# Patient Record
Sex: Male | Born: 1970 | Race: White | Hispanic: No | Marital: Married | Smoking: Never smoker
Health system: Southern US, Community
[De-identification: ages and names within clinical notes are randomized; demographics above are authoritative.]

## PROBLEM LIST (undated history)

## (undated) DIAGNOSIS — G4700 Insomnia, unspecified: Secondary | ICD-10-CM

## (undated) DIAGNOSIS — Z79890 Hormone replacement therapy (postmenopausal): Secondary | ICD-10-CM

## (undated) DIAGNOSIS — F411 Generalized anxiety disorder: Secondary | ICD-10-CM

## (undated) DIAGNOSIS — K5792 Diverticulitis of intestine, part unspecified, without perforation or abscess without bleeding: Secondary | ICD-10-CM

## (undated) DIAGNOSIS — F988 Other specified behavioral and emotional disorders with onset usually occurring in childhood and adolescence: Secondary | ICD-10-CM

## (undated) DIAGNOSIS — M1990 Unspecified osteoarthritis, unspecified site: Secondary | ICD-10-CM

## (undated) DIAGNOSIS — G4730 Sleep apnea, unspecified: Secondary | ICD-10-CM

## (undated) DIAGNOSIS — T7840XA Allergy, unspecified, initial encounter: Secondary | ICD-10-CM

## (undated) HISTORY — DX: Other specified behavioral and emotional disorders with onset usually occurring in childhood and adolescence: F98.8

## (undated) HISTORY — DX: Diverticulitis of intestine, part unspecified, without perforation or abscess without bleeding: K57.92

## (undated) HISTORY — DX: Allergy, unspecified, initial encounter: T78.40XA

## (undated) HISTORY — DX: Hormone replacement therapy: Z79.890

## (undated) HISTORY — PX: HERNIA REPAIR: SHX51

## (undated) HISTORY — DX: Insomnia, unspecified: G47.00

## (undated) HISTORY — DX: Generalized anxiety disorder: F41.1

---

## 2007-01-12 ENCOUNTER — Emergency Department (HOSPITAL_COMMUNITY): Admission: EM | Admit: 2007-01-12 | Discharge: 2007-01-13 | Payer: Self-pay | Admitting: Emergency Medicine

## 2008-08-12 HISTORY — PX: QUADRICEPS TENDON REPAIR: SHX756

## 2008-08-19 ENCOUNTER — Ambulatory Visit (HOSPITAL_BASED_OUTPATIENT_CLINIC_OR_DEPARTMENT_OTHER): Admission: RE | Admit: 2008-08-19 | Discharge: 2008-08-19 | Payer: Self-pay | Admitting: Orthopaedic Surgery

## 2008-08-28 ENCOUNTER — Encounter: Admission: RE | Admit: 2008-08-28 | Discharge: 2008-08-28 | Payer: Self-pay | Admitting: Orthopaedic Surgery

## 2010-02-17 ENCOUNTER — Encounter: Payer: Self-pay | Admitting: Internal Medicine

## 2010-03-19 ENCOUNTER — Encounter: Payer: Self-pay | Admitting: Internal Medicine

## 2010-04-08 ENCOUNTER — Ambulatory Visit
Admission: RE | Admit: 2010-04-08 | Discharge: 2010-04-08 | Payer: Self-pay | Source: Home / Self Care | Attending: Internal Medicine | Admitting: Internal Medicine

## 2010-04-08 ENCOUNTER — Other Ambulatory Visit: Payer: Self-pay | Admitting: Internal Medicine

## 2010-04-08 DIAGNOSIS — R5383 Other fatigue: Secondary | ICD-10-CM

## 2010-04-08 DIAGNOSIS — R0602 Shortness of breath: Secondary | ICD-10-CM | POA: Insufficient documentation

## 2010-04-08 DIAGNOSIS — R5381 Other malaise: Secondary | ICD-10-CM | POA: Insufficient documentation

## 2010-04-14 ENCOUNTER — Encounter: Payer: Self-pay | Admitting: Internal Medicine

## 2010-04-15 NOTE — Assessment & Plan Note (Signed)
Summary: increasing scarring on cxr/jd   Visit Type:  Initial Consult Copy to:  Dr. Earl Lites Primary Provider/Referring Provider:  Dr. Eula Listen  CC:  Pulmonary Consult for abnormal CXR. Pt was diagnosed with PNA in 04-2009 and feel she has has on and off congestion and cough since then. .  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 AT work he is exposed to lot of wood dust. States was in usual state of health till feb 2011. One day he installed some concrete while installing deck. Immediately felt inhalation of cement. Soon after developed chest tightness, maybe some wheeze,raspy voice. This lasted several weeks. AFter that had global sense of fatigue worse with exertion. THis has persisted since then. Subsequently in Nov 2011 developed another "Episode" that was treated with abx. This time he thinks was a viral episode or possibly related to work. Needed antibiotics. Reportedly CXR at this time showed Rt sided pneumonia (I do not have this cxr). Then again similear episode around 02/17/2010 and again treated with abx. This time girlfriend also got sick. CXR done this time looks clear to me. HE is still left with fatigue, rasy voice, chest tightness and some dyspnea.   Preventive Screening-Counseling & Management  Alcohol-Tobacco     Smoking Status: never  Current Medications (verified): 1)  Concerta 27 Mg Cr-Tabs (Methylphenidate Hcl) .... Take 1 Tablet By Mouth Once A Day 2)  Klonopin 0.5 Mg Tabs (Clonazepam) .... 2 Tabs Daily 3)  Nasal Spray 0.05 % Soln (Oxymetazoline Hcl) .... As Needed  Allergies (verified): 1)  ! 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  The patient denies shortness of breath with activity, shortness of breath at rest, productive cough, non-productive cough, coughing up blood, chest pain, irregular heartbeats, acid heartburn, indigestion, difficulty swallowing, sore throat, tooth/dental problems, headaches, nasal congestion/difficulty breathing through nose, sneezing, itching, ear ache, depression, hand/feet swelling, rash, change in color of mucus, and fever.    Vital Signs:  Patient profile:   40 year old male Height:      70 inches Weight:      233.50 pounds BMI:     33.62 O2 Sat:      96 % on Room air Temp:     98.0 degrees F oral Pulse rate:   89 / minute BP sitting:   128 / 82  (right arm) Cuff size:   regular  Vitals Entered By: Carron Curie CMA (April 08, 2010 3:34 PM)  O2 Flow:  Room air CC: Pulmonary Consult for abnormal CXR. Pt was diagnosed with PNA in 04-2009 and feel she has has on and off congestion and cough since then.  Comments Medications reviewed with patient Carron Curie CMA  April 08, 2010 3:39 PM Daytime phone number verified with patient.    Physical Exam  General:  mucled male heavy muscle bulk Head:  normocephalic and atraumatic Eyes:  PERRLA/EOM intact; conjunctiva and sclera clear Ears:  TMs intact and clear with normal canals Nose:  no deformity, discharge, inflammation, or lesions Mouth:  no deformity or lesions Neck:  no masses, thyromegaly, or abnormal cervical nodes Chest Wall:  no deformities noted Lungs:  clear bilaterally to auscultation and percussion Heart:  regular rate and rhythm, S1, S2 without murmurs, rubs, gallops, or clicks Abdomen:  bowel sounds positive; abdomen soft and non-tender without masses, or organomegaly Msk:  no deformity or scoliosis noted with normal  posture Pulses:  pulses normal Extremities:  no clubbing, cyanosis, edema, or deformity noted Neurologic:  CN II-XII grossly intact with normal reflexes, coordination, muscle strength and tone Skin:  intact without lesions or rashes Cervical Nodes:  no significant adenopathy Axillary Nodes:  no significant adenopathy Psych:  alert and cooperative; normal mood and affect; normal attention span and concentration   CXR  Procedure date:  02/17/2010  Findings:      Looks clear to me  Impression & Recommendations:  Problem # 1:  SHORTNESS OF BREATH (SOB) (ICD-786.05) Assessment New  acute bronchitiic episodes sound like asthma. IN between he has fatigue that is exertional and some dyspnea. Wonder if he has atshma from [employer] exposure. Also, CT abd 2005 suggests bibasal atelectasis/scarring. I will get PFts and decide next step of CT verus methacholine challenge versus CPST  Orders: Consultation Level V (16109)  Problem # 2:  FATIGUE (ICD-780.79) Assessment: New  checkTSH  Orders: Consultation Level V (60454)  Medications Added to Medication List This Visit: 1)  Concerta 27 Mg Cr-tabs (Methylphenidate hcl) .... Take 1 tablet by mouth once a day 2)  Klonopin 0.5 Mg Tabs (Clonazepam) .... 2 tabs daily 3)  Nasal Spray 0.05 % Soln (Oxymetazoline hcl) .... As needed  Patient Instructions: 1)  get thyroid function studies 2)  get full pft 3)  woill call you with result

## 2010-04-21 NOTE — Letter (Signed)
Summary: [facility]   Imported By: Sherian Rein 04/14/2010 10:15:44  _____________________________________________________________________  External Attachment:    Type:   Image     Comment:   External Document

## 2010-07-27 NOTE — Op Note (Signed)
NAME:  Jose Shaffer, Jose Shaffer          ACCOUNT NO.:  [id number]   MEDICAL RECORD NO.:  [id number]          PATIENT TYPE:  AMB   LOCATION:  DSC                          FACILITY:  [facility]   PHYSICIAN:  Lubertha Basque. Dalldorf, M.D.DATE OF BIRTH:  03/31/1970   DATE OF PROCEDURE:  08/19/2008  DATE OF DISCHARGE:                               OPERATIVE REPORT   PREOPERATIVE DIAGNOSIS:  Left quadriceps rupture.   POSTOPERATIVE DIAGNOSIS:  Left quadriceps rupture.   PROCEDURE:  Left quadriceps repair.   ANESTHESIA:  General and block.   ATTENDING SURGEON:  Lubertha Basque. Jerl Santos, MD   ASSISTANT:  Lindwood Qua, PA   INDICATIONS FOR PROCEDURE:  The patient is a 40 year old man who  suffered a injury stepping off a deck a few days back.  He had a  palpable defect above his kneecap and an inability to raise his leg.  He  is presumed to have a quadriceps rupture and this has been confirmed by  ultrasound.  He is offered quadriceps repair.  Informed operative  consent was obtained after discussion of possible complications  including reaction to anesthesia, infection, and repeat rupture.   SUMMARY/FINDINGS/PROCEDURE:  Under general anesthesia and a block  through a longitudinal incision, a quadriceps rupture was exposed and  repaired.  This involved the entire central portion of the quadriceps  and this was repaired with 4 core limbs of suture to soft tissue as the  rupture was a couple of centimeters above the attachment on the patella.  There was also a lateral aspect that was completely ruptured and this  was repaired.  Bryna Colander assisted throughout and was invaluable to  the completion of the case and that he helped maintain reduction while I  performed the repair.  He also closed simultaneously to help minimize  the OR time.   DESCRIPTION OF PROCEDURE:  The patient was taken to the operating suite  where general anesthetic was applied without difficulty.  He was also  given a block in the  preanesthesia area.  He was positioned supine and  prepped and draped in normal sterile fashion.  After administration of  IV Kefzol, the left leg was elevated, exsanguinated, and a tourniquet  inflated about the thigh.  A longitudinal anterior incision was made  with dissection down to the quadriceps.  The fascial layer was split  longitudinally and this exposed a complete rupture of the central  insertion of the quadriceps mechanism.  This was actually ruptured 2 cm  above the superior pole of the patella.  We elected to do a soft tissue  repair.  I placed two of the #2 FiberWire sutures in [place] fashion  proximally and passed all 4 limbs of the two sutures through to the soft  tissue above the patella.  This brought the central portion down  appropriately.  I then oversewed with #1 Vicryl.  There was a lateral  rent in the quadriceps as well which was repaired with #2 FiberWire in a  figure-of-eight fashion.  The tourniquet was deflated and a small amount  of bleeding was easily controlled with Bovie cautery and some pressure.  We did evacuate a hemarthrosis in the middle of the case.  We also  irrigated the knee at that point.  The fascial layer was then  reapproximated with #1 Vicryl longitudinally followed by reapproximation  of subcu with 2-0 undyed Vicryl and skin with staples.  Adaptic was  applied followed by dry gauze and loose Ace wrap and knee immobilizer.  Estimated blood loss and intraoperative fluids as well as accurate  tourniquet time can be obtained from the Anesthesia records.   DISPOSITION:  The patient was extubated in the operating room and taken  to recovery in stable addition.  He is to go home same-day and follow up  in the office closely.  I will contact him by phone tonight.      Lubertha Basque Jerl Santos, M.D.  Electronically Signed     PGD/MEDQ  D:  08/19/2008  T:  08/20/2008  Job:  [id number]

## 2010-07-27 NOTE — H&P (Signed)
NAME:  Jose Shaffer, Jose Shaffer          ACCOUNT NO.:  [id number]   MEDICAL RECORD NO.:  [id number]          PATIENT TYPE:  EMS   LOCATION:  MAJO                         FACILITY:  [facility]   PHYSICIAN:  Ollen Gross. Vernell Morgans, M.D. DATE OF BIRTH:  [birth year]   DATE OF ADMISSION:  01/12/2007  DATE OF DISCHARGE:                              HISTORY & PHYSICAL   Mr. Krider is a 40 year old white male who presents to the emergency  department tonight with upper abdominal pain and nausea that started  earlier today.  He has not vomited.  He did pass flatus earlier and had  a normal bowel movement today.  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  He developed it 6 years ago raking  leaves in the yard.  Since he  noticed it has always been little bit  stuck and never really reduced.  It has not changed to the recent past.  He otherwise denies any chest pain, shortness of breath, diarrhea,  dysuria.  The rest of his review of systems unremarkable.   PAST MEDICAL HISTORY:  Significant for attention deficit disorder.   PAST SURGICAL HISTORY:  None.   MEDICATIONS:  Clonazepam.   ALLERGIES:  Are to aspirin.   SOCIAL HISTORY:  Denies any use of alcohol or tobacco products.   FAMILY HISTORY:  Is noncontributory.   PHYSICAL EXAM:  Temperature is 97.6, blood pressure 145/90, pulse 97.  GENERAL:  He is a well-nourished white male in no acute distress.  Skin  is warm and dry without jaundice.  EYES:  Extraocular movements intact.  Pupils equal, round and reactive  to light.  Sclerae nonicteric.  LUNGS:  Clear bilaterally with no use of accessory muscles.  HEART is regular rate and rhythm without impulse in left chest.  ABDOMEN soft and nontender, nondistended.  He does have a small  umbilical hernia that does not really reduce.  It is a little bit  painful to manipulate but as he says it is unchanged. EXTREMITIES:  No  cyanosis, clubbing or edema, with good strength in his  arms and legs.  PSYCHOLOGICALLY he is alert and oriented x3 with no evidence of anxiety  or depression.   LAB WORK:  Was reviewed and was significant for a elevated white count  of 13.4.  His plain abdominal x-rays were consistent with a small bowel  obstruction.   ASSESSMENT/PLAN:  This is a 40 year old white male with some abdominal  pain and a picture of a small bowel obstruction on plain film.  This  certainly could be coming from his small umbilical hernia although  gastritis would also be in the differential given that his hernia is not  changed.  Will plan to get a CT scan to evaluate this hernia.  If the  hernia does contained a loop or knuckle of small intestine that is  causing the obstruction, then I think he will  probably need urgent surgery tonight to fix this.  I have explained to  him in detail the risks and benefits of this type of operation as well  as some of the technical aspects.  He understands and agrees with this  treatment plan. We will wait for the scan results.      Ollen Gross. Vernell Morgans, M.D.  Electronically Signed     PST/MEDQ  D:  01/13/2007  T:  01/14/2007  Job:  [id number]

## 2010-12-22 LAB — URINE MICROSCOPIC-ADD ON

## 2010-12-22 LAB — CBC
HCT: 50.2
Hemoglobin: 17.1 — ABNORMAL HIGH
MCHC: 34
MCV: 84.8
Platelets: 334
RBC: 5.92 — ABNORMAL HIGH
RDW: 13.2
WBC: 13.4 — ABNORMAL HIGH

## 2010-12-22 LAB — URINALYSIS, ROUTINE W REFLEX MICROSCOPIC
Bilirubin Urine: NEGATIVE
Glucose, UA: NEGATIVE
Hgb urine dipstick: NEGATIVE
Ketones, ur: NEGATIVE
Leukocytes, UA: NEGATIVE
Nitrite: NEGATIVE
Protein, ur: 30 — AB
Specific Gravity, Urine: 1.024
Urobilinogen, UA: 0.2
pH: 8.5 — ABNORMAL HIGH

## 2010-12-22 LAB — DIFFERENTIAL
Basophils Absolute: 0
Basophils Relative: 0
Eosinophils Absolute: 0
Eosinophils Relative: 0
Lymphocytes Relative: 8 — ABNORMAL LOW
Lymphs Abs: 1
Monocytes Absolute: 1.2 — ABNORMAL HIGH
Monocytes Relative: 9
Neutro Abs: 11.1 — ABNORMAL HIGH
Neutrophils Relative %: 83 — ABNORMAL HIGH

## 2010-12-22 LAB — COMPREHENSIVE METABOLIC PANEL
AST: 73 — ABNORMAL HIGH
BUN: 11
CO2: 25
Chloride: 97
Creatinine, Ser: 1.15
Total Protein: 7.2

## 2010-12-22 LAB — COMPREHENSIVE METABOLIC PANEL WITH GFR
ALT: 48
Albumin: 4.5
Alkaline Phosphatase: 46
Calcium: 9.8
GFR calc Af Amer: >60
GFR calc non Af Amer: >60
Glucose, Bld: 97
Potassium: 5.1
Sodium: 132 — ABNORMAL LOW
Total Bilirubin: 2.1 — ABNORMAL HIGH

## 2010-12-22 LAB — LIPASE, BLOOD: Lipase: 23

## 2011-04-11 ENCOUNTER — Ambulatory Visit (INDEPENDENT_AMBULATORY_CARE_PROVIDER_SITE_OTHER): Payer: BC Managed Care – PPO

## 2011-04-11 DIAGNOSIS — F411 Generalized anxiety disorder: Secondary | ICD-10-CM

## 2011-04-11 DIAGNOSIS — R030 Elevated blood-pressure reading, without diagnosis of hypertension: Secondary | ICD-10-CM

## 2011-09-11 ENCOUNTER — Ambulatory Visit (INDEPENDENT_AMBULATORY_CARE_PROVIDER_SITE_OTHER): Payer: BC Managed Care – PPO | Admitting: Internal Medicine

## 2011-09-11 VITALS — BP 144/93 | HR 97 | Temp 98.9°F | Resp 16 | Ht 71.25 in | Wt 238.0 lb

## 2011-09-11 DIAGNOSIS — G4730 Sleep apnea, unspecified: Secondary | ICD-10-CM

## 2011-09-11 DIAGNOSIS — F988 Other specified behavioral and emotional disorders with onset usually occurring in childhood and adolescence: Secondary | ICD-10-CM

## 2011-09-11 DIAGNOSIS — R0981 Nasal congestion: Secondary | ICD-10-CM

## 2011-09-11 DIAGNOSIS — R030 Elevated blood-pressure reading, without diagnosis of hypertension: Secondary | ICD-10-CM

## 2011-09-11 DIAGNOSIS — R400 Somnolence: Secondary | ICD-10-CM

## 2011-09-11 DIAGNOSIS — G4710 Hypersomnia, unspecified: Secondary | ICD-10-CM

## 2011-09-11 DIAGNOSIS — J3489 Other specified disorders of nose and nasal sinuses: Secondary | ICD-10-CM

## 2011-09-11 MED ORDER — METHYLPHENIDATE HCL ER (OSM) 36 MG PO TBCR: 36.0000 mg | EXTENDED_RELEASE_TABLET | Freq: Two times a day (BID) | ORAL | Status: DC

## 2011-09-11 MED ORDER — CLONAZEPAM 0.5 MG PO TABS: 0.5000 mg | ORAL_TABLET | Freq: Two times a day (BID) | ORAL | Status: DC | PRN

## 2011-09-11 NOTE — Progress Notes (Signed)
  Subjective:    Patient ID: Jose Shaffer, male    DOB: [birth year], 41 y.o.   MRN: [id number]  HPIHere for followup for ADD medication History dates to childhood was never treated  Dr. Ferd Glassing started treatment as an adult Uses Concerta/has irritability would wear off controlled by Klonopin/ Does well at work with this regimen Note increased blood pressure    Review of SystemsHis long history of nasal congestion dating to childhood Nonrestorative sleep Daytime hypersomnolence Girlfriend has observed apnea with waking and gasping Often has early morning trouble with getting started which Concerta helps No history of heart problems No exercise intolerance C. Pulmonary evaluation for shortness of breath inconclusive     Objective:   Physical Exam  Vital signs blood pressure 144/93 Nasal turbinates are very boggy/noisy breathing Neck without thyromegaly or lymphadenopathy Heart regular Extremities no edema Neurological intact Mood and affect normal      Assessment & Plan:   1. ADD (attention deficit disorder)    2. Sleep apnea  Nocturnal polysomnography (NPSG)  3. Daytime somnolence    4. Chronic nasal congestion    5. Elevated blood pressure     Meds ordered this encounter  Medications  . DISCONTD: methylphenidate (CONCERTA) 36 MG CR tablet    Sig: Take 36 mg by mouth 2 (two) times daily.   . methylphenidate (CONCERTA) 36 MG CR tablet    Sig: Take 1 tablet (36 mg total) by mouth 2 (two) times daily.    Dispense:  60 tablet    Refill:  0  . methylphenidate (CONCERTA) 36 MG CR tablet    Sig: Take 1 tablet (36 mg total) by mouth 2 (two) times daily.    Dispense:  60 tablet    Refill:  0  . methylphenidate (CONCERTA) 36 MG CR tablet    Sig: Take 1 tablet (36 mg total) by mouth 2 (two) times daily.    Dispense:  60 tablet    Refill:  0  . clonazePAM (KLONOPIN) 0.5 MG tablet    Sig: Take 1 tablet (0.5 mg total) by mouth 2 (two) times daily as needed for  anxiety.    Dispense:  60 tablet    Refill:  5   Followup after sleep study Followup in 3 months for concerta

## 2011-10-04 ENCOUNTER — Other Ambulatory Visit: Payer: Self-pay | Admitting: Family Medicine

## 2011-10-07 ENCOUNTER — Encounter (HOSPITAL_BASED_OUTPATIENT_CLINIC_OR_DEPARTMENT_OTHER): Payer: Self-pay

## 2011-10-09 ENCOUNTER — Encounter (HOSPITAL_BASED_OUTPATIENT_CLINIC_OR_DEPARTMENT_OTHER): Payer: Self-pay

## 2011-10-19 ENCOUNTER — Ambulatory Visit (INDEPENDENT_AMBULATORY_CARE_PROVIDER_SITE_OTHER): Payer: BC Managed Care – PPO | Admitting: Internal Medicine

## 2011-10-19 VITALS — BP 143/83 | HR 85 | Temp 98.2°F | Resp 16 | Ht 71.25 in | Wt 244.0 lb

## 2011-10-19 DIAGNOSIS — F411 Generalized anxiety disorder: Secondary | ICD-10-CM

## 2011-10-19 DIAGNOSIS — Z0489 Encounter for examination and observation for other specified reasons: Secondary | ICD-10-CM

## 2011-10-19 DIAGNOSIS — F419 Anxiety disorder, unspecified: Secondary | ICD-10-CM

## 2011-10-19 NOTE — Patient Instructions (Signed)
Decrease the klonopin by .25 mg every 2 weeks. No salty foods no added salt. followup as directed.

## 2011-10-19 NOTE — Progress Notes (Signed)
  Subjective:    Patient ID: Jose Shaffer, male    DOB: 1970-08-15, 41 y.o.   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 Feels a little anxious. Stopped concerta several days ago and decreased klonopin from 1 mg at bedtime to .5 mg at bedtime 2 days ago.also concerned about his bp being slightly elevated. He eats poorly chips and lasagna today.Also request urine drug test  Review of Systems  Constitutional: Negative.   HENT: Negative.   Eyes: Negative.   Cardiovascular: Negative.   Gastrointestinal: Negative.   Genitourinary: Negative.   Musculoskeletal: Negative.   Skin: Negative.   Neurological: Negative.   Hematological: Negative.   Psychiatric/Behavioral: Positive for agitation. Negative for hallucinations, behavioral problems, confusion, dysphoric mood and decreased concentration.  All other systems reviewed and are negative.       Objective:   Physical Exam  Nursing note and vitals reviewed. Constitutional: He is oriented to person, place, and time. He appears well-developed and well-nourished.  HENT:  Head: Normocephalic and atraumatic.  Right Ear: External ear normal.  Left Ear: External ear normal.  Eyes: Conjunctivae and EOM are normal. Pupils are equal, round, and reactive to light.  Neck: Normal range of motion. Neck supple.  Cardiovascular: Normal rate, regular rhythm and normal heart sounds.   Pulmonary/Chest: Effort normal and breath sounds normal.  Abdominal: Soft. Bowel sounds are normal.  Musculoskeletal: Normal range of motion.  Neurological: He is alert and oriented to person, place, and time.  Skin: Skin is warm and dry.  Psychiatric: He has a normal mood and affect. His behavior is normal. Judgment and thought content normal.          Assessment & Plan:  Anxiety   plan to decrease the benzodiazepam more slowly at .25 mg every 2 weeks.  Will re eval bp after making dietary changes and after withdrawal symptoms have resolves.

## 2011-10-26 ENCOUNTER — Telehealth: Payer: Self-pay | Admitting: Family Medicine

## 2011-10-26 ENCOUNTER — Ambulatory Visit (INDEPENDENT_AMBULATORY_CARE_PROVIDER_SITE_OTHER): Payer: BC Managed Care – PPO | Admitting: Family Medicine

## 2011-10-26 DIAGNOSIS — Z0283 Encounter for blood-alcohol and blood-drug test: Secondary | ICD-10-CM

## 2011-10-26 DIAGNOSIS — Z0489 Encounter for examination and observation for other specified reasons: Secondary | ICD-10-CM

## 2011-10-26 NOTE — Telephone Encounter (Signed)
Called Pt- He stated he used the restroom at the front (with the passbox) to give his urine sample. I checked with Revonda Standard, Dr. Lennette Bihari assistant from last week and she stated she did not do a drug screen for this Pt.  Pt agrees to come in this morning for a repeat D/S - on the house. Will notify Enrique Sack. and front desk staff. Eileen Stanford

## 2012-02-05 ENCOUNTER — Ambulatory Visit: Payer: Self-pay | Admitting: Physician Assistant

## 2012-02-05 VITALS — BP 154/94 | HR 100 | Temp 98.2°F | Resp 16 | Ht 71.25 in | Wt 236.4 lb

## 2012-02-05 DIAGNOSIS — J019 Acute sinusitis, unspecified: Secondary | ICD-10-CM

## 2012-02-05 DIAGNOSIS — J329 Chronic sinusitis, unspecified: Secondary | ICD-10-CM

## 2012-02-05 MED ORDER — GUAIFENESIN ER 1200 MG PO TB12: 1.0000 | ORAL_TABLET | Freq: Two times a day (BID) | ORAL | Status: DC | PRN

## 2012-02-05 MED ORDER — AMOXICILLIN-POT CLAVULANATE 875-125 MG PO TABS: 1.0000 | ORAL_TABLET | Freq: Two times a day (BID) | ORAL | Status: DC

## 2012-02-05 MED ORDER — IPRATROPIUM BROMIDE 0.03 % NA SOLN: 2.0000 | Freq: Two times a day (BID) | NASAL | Status: DC

## 2012-02-05 NOTE — Patient Instructions (Signed)
Get plenty of rest and drink at least 64 ounces of water daily. 

## 2012-02-05 NOTE — Progress Notes (Signed)
  Subjective:    Patient ID: Jose Shaffer, male    DOB: 03-07-1971, 41 y.o.   MRN: [id number]  HPI This 41 y.o. male presents for evaluation of "a sinus infection."  Congested "all over," mostly on the left side.  Facial pain, HA and upper teeth hurt. No "green stuff" but has bloody nasal discharge.  Began with a sore throat and has progressed over the past 6 days.  Feels feverish/chilled.  Nausea, vomiting x 1, no diarrhea.  No cough.     Past Medical History  Diagnosis Date  . Allergy   . ADD (attention deficit disorder)   . Insomnia     Past Surgical History  Procedure Date  . Quadriceps tendon repair 08/2008    Prior to Admission medications   Medication Sig Start Date End Date Taking? Authorizing Provider  clonazePAM (KLONOPIN) 0.5 MG tablet Take 1 mg by mouth daily. 09/11/11 03/06/12 Yes Tonye Pearson, MD    Allergies  Allergen Reactions  . Ibuprofen     History   Social History  . Marital Status: Single    Spouse Name: n/a    Number of Children: 0  . Years of Education: 12+   Occupational History  . truck driver   . contractor    Social History Main Topics  . Smoking status: Never Smoker   . Smokeless tobacco: Never Used  . Alcohol Use: No  . Drug Use: No  . Sexually Active: Yes -- Male partner(s)    Birth Control/ Protection: None   Other Topics Concern  . Not on file   Social History Narrative   Lives with girlfriend.    Family History  Problem Relation Age of Onset  . Hypertension Father   . Cancer Maternal Grandmother 41    breast cancer  . Hypertension Paternal Grandmother   . COPD Paternal Grandfather     lung cancer; tobacco use, Doctor, general practice  . Cancer Maternal Aunt     breast cancer, late 67's  . Cancer Maternal Aunt     breast cancer; diagnosed late 50's    Review of Systems As above.    Objective:   Physical Exam  Blood pressure 154/94, pulse 100, temperature 98.2 F (36.8 C), temperature source Oral, resp.  rate 16, height 5' 11.25" (1.81 m), weight 236 lb 6.4 oz (107.23 kg), SpO2 100.00%. Body mass index is 32.74 kg/(m^2). Well-developed, well nourished WM who is awake, alert and oriented, in NAD. HEENT: [place]/AT, PERRL, EOMI.  Sclera and conjunctiva are clear.  EAC are patent, TMs are normal in appearance. Nasal mucosa is pink and moist. OP is clear. No frontal sinus tenderness.  LEFT maxillary sinus tenderness. Neck: supple, non-tender, no lymphadenopathy, thyromegaly. Heart: RRR, no murmur Lungs: normal effort, CTA Extremities: no cyanosis, clubbing or edema. Skin: warm and dry. Psychologic: good mood and appropriate affect, normal speech and behavior.     Assessment & Plan:   1. Sinusitis  ipratropium (ATROVENT) 0.03 % nasal spray, Guaifenesin (MUCINEX MAXIMUM STRENGTH) 1200 MG TB12, amoxicillin-clavulanate (AUGMENTIN) 875-125 MG per tablet   Supportive care.  Anticipatory guidance.

## 2012-02-27 ENCOUNTER — Telehealth: Payer: Self-pay | Admitting: Radiology

## 2012-02-27 NOTE — Telephone Encounter (Signed)
I have gotten a form, from [employer] where patient works, they need you to provide a note that you are aware of drivers assigned duty and his Klopinin will not adversely affect his ability to safely operate a vehicle. Please advise if you can write this note.

## 2012-02-28 NOTE — Telephone Encounter (Signed)
I cannot provide a note that says he will be completely safe driving under the influence of Klonopin. I can only provide a note saying that he is advised to use his Klonopin after work hours and before bed only

## 2012-02-29 ENCOUNTER — Telehealth: Payer: Self-pay

## 2012-02-29 NOTE — Telephone Encounter (Signed)
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 PT HAS WEANED OFF KLONOPIN. WANTS TO COME IN TO TAKE D/S TO SHOW HE IS CLEAR OF PRESCRIPTION. BC HE MENTIONED ON HIS DOT FORM. HE NEEDS A LETTER STATING THAT HE IS NO LONGER TAKING THE MEDICATION.     CALL BACK # [phone]

## 2012-02-29 NOTE — Telephone Encounter (Signed)
Called patient to advise. Letter is faxed for him

## 2012-03-01 ENCOUNTER — Ambulatory Visit: Payer: Self-pay | Admitting: Family Medicine

## 2012-03-01 VITALS — BP 120/82 | HR 90 | Temp 98.4°F | Resp 16 | Ht 71.0 in | Wt 240.4 lb

## 2012-03-01 DIAGNOSIS — Z719 Counseling, unspecified: Secondary | ICD-10-CM

## 2012-03-01 NOTE — Telephone Encounter (Signed)
Does he need to return to office to see you? Or can he just come in for drug screen?

## 2012-03-01 NOTE — Patient Instructions (Signed)
See the printed letter.  If more information needed let us know, or any recurrence in Add symptoms - recheck in office. Return to the clinic or go to the nearest emergency room if any of your symptoms worsen or new symptoms occur.

## 2012-03-01 NOTE — Telephone Encounter (Signed)
I saw patient in office this am to discuss this and letter given then (phone note from yesterday noted at today's OV).

## 2012-03-01 NOTE — Progress Notes (Signed)
  Subjective:    Patient ID: Jose Shaffer, male    DOB: Oct 08, 1970, 41 y.o.   MRN: [id number]  HPI Jose Shaffer is a 41 y.o. male  09/11/11 visit with Dr. Merla Riches - Klonopin 0.5mg  BID, and was on Concerta at that time.  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   Requested letter for job - per telephone note from Dr Merla Riches: cannot provide a note that says he will be completely safe driving under the influence of Klonopin. I can only provide a note saying that he is advised to use his Klonopin after work hours and before bed only.  Per call yesterday - patient weaned off Klonopin, wants drug screen to reflect he is no longer on this medicine for work.  Prior on Concerta and Klonopin for ADD and fatigue, and irritability sx's when coming off Concerta.  Would take Klonopin in the evenings only.  Has been tapering down over past few months. Now off meds completely, and has not taken any Klonopin in the past week.  Last dose was 0.125mg   (1/4 of 0.5mg  about a week ago).  Other provider evaluated for DOT 3 days ago - had drug screen performed there, that was negative by his understanding.  Doing ok off this medicine - sleeping normally, not anxious or depressed.   No hx of MVA, or any daytime sedation. Denies distractability with driving. Off concerta for few months.    Review of Systems  Constitutional: Negative for fatigue.       No daytime somnolence.   Psychiatric/Behavioral: Negative for sleep disturbance, dysphoric mood, decreased concentration and agitation. The patient is not nervous/anxious.        Objective:   Physical Exam  Constitutional: He is oriented to person, place, and time. He appears well-developed and well-nourished. No distress.  Cardiovascular: Normal rate, regular rhythm, normal heart sounds and intact distal pulses.   Pulmonary/Chest: Effort normal and breath sounds normal.  Neurological: He is alert and oriented to  person, place, and time.  Skin: Skin is warm and dry. He is not diaphoretic.  Psychiatric: He has a normal mood and affect. His behavior is normal. Judgment and thought content normal.          Assessment & Plan:  Jose Shaffer is a 41 y.o. male   ADD by hx with irritabilty coming off med at night, but stable and feels well off meds.  No Klonopin in past week.  Had drug screen at other office by report that was negative, but letter needed today. Denies distractability, focus ability or daytime sedation, and is off meds. Will complete letter based on patient's hx, but if repeat drug screen needed we can complete this here. No concerning findings on exam.  Advised to rtc if any return of ADD or irritability symptoms.

## 2012-09-09 ENCOUNTER — Ambulatory Visit: Payer: PRIVATE HEALTH INSURANCE | Admitting: Family Medicine

## 2012-09-09 VITALS — BP 112/86 | HR 113 | Temp 98.0°F | Resp 17 | Ht 71.5 in | Wt 240.0 lb

## 2012-09-09 DIAGNOSIS — T485X1A Poisoning by other anti-common-cold drugs, accidental (unintentional), initial encounter: Secondary | ICD-10-CM

## 2012-09-09 DIAGNOSIS — F988 Other specified behavioral and emotional disorders with onset usually occurring in childhood and adolescence: Secondary | ICD-10-CM

## 2012-09-09 MED ORDER — METHYLPHENIDATE HCL ER (OSM) 36 MG PO TBCR: 36.0000 mg | EXTENDED_RELEASE_TABLET | ORAL | Status: DC

## 2012-09-09 MED ORDER — FLUTICASONE PROPIONATE 50 MCG/ACT NA SUSP: NASAL | Status: DC

## 2012-09-09 MED ORDER — PREDNISONE 20 MG PO TABS: ORAL_TABLET | ORAL | Status: DC

## 2012-09-09 MED ORDER — CLONAZEPAM 0.5 MG PO TABS: 1.0000 mg | ORAL_TABLET | Freq: Every day | ORAL | Status: DC

## 2012-09-09 NOTE — Progress Notes (Signed)
Subjective: 42 year old man who has a long history of attention deficit issues. Dr. Ferd Glassing started him originally on medication for ADHD. Dr. Merla Riches has seen him in the past, as well as some of the other doctors, and had adjusted his medications some. He used to be on a regimen of Concerta 54 mg with Klonopin 0.5 mg late in the day. This worked fairly well but he wanted off the medicines and last fall got off the Concerta, using the Klonopin for a time. He is not on any medications at this time. 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 He would like to be back on the Concerta, preferring a lower dose initially at least.  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 His left side of his nose stays chronically stuffy. Sometime ago he was told that he may have a spur that needs to be removed from in there.  Objective: Pleasant alert oriented gentleman in no major distress. His right nares is open. Left is very swollen shut. Bowel sounds are good.  Assessment: Attention deficit disorder Rhinitis medicamentosa  Plan: Will resume the Concerta 36 mg daily. Use the Klonopin one at bedtime as needed   Advised him to get off of the Afrin. I will give him a brief taper of prednisone, and put him on fluticasone nose spray. If he continues with this much stuffiness of the nose I would recommend that he see an air nose and throat doctor for further assessment of the long-term left nasal stuffiness.

## 2012-09-09 NOTE — Patient Instructions (Addendum)
Take the prednisone 3 pills each morning for 3 days, then 2 each morning for 3 days, then one each morning for 3 days.  It might be best to wait until after you get down to only one prednisone before beginning the Concerta 36 mg one daily.  Begin using fluticasone nose spray 2 sprays each nostril twice daily for about one week, then drop back to just once daily.  Prescriptions given for 3 months of the Concerta, and advise return at that point or sooner if needed. I would recommend that you see Dr. Merla Riches on a regular basis for this as attention deficit issues are an interested his.  Klonipin 0.5 one at night

## 2013-03-10 ENCOUNTER — Ambulatory Visit (INDEPENDENT_AMBULATORY_CARE_PROVIDER_SITE_OTHER): Payer: BC Managed Care – PPO | Admitting: Internal Medicine

## 2013-03-10 VITALS — BP 134/78 | HR 94 | Temp 98.2°F | Resp 16 | Ht 70.75 in | Wt 240.0 lb

## 2013-03-10 DIAGNOSIS — F988 Other specified behavioral and emotional disorders with onset usually occurring in childhood and adolescence: Secondary | ICD-10-CM

## 2013-03-10 DIAGNOSIS — G4700 Insomnia, unspecified: Secondary | ICD-10-CM

## 2013-03-10 DIAGNOSIS — F411 Generalized anxiety disorder: Secondary | ICD-10-CM

## 2013-03-10 HISTORY — DX: Generalized anxiety disorder: F41.1

## 2013-03-10 MED ORDER — BUPROPION HCL ER (XL) 300 MG PO TB24: 300.0000 mg | ORAL_TABLET | Freq: Every day | ORAL | Status: DC

## 2013-03-10 MED ORDER — BUPROPION HCL 100 MG PO TABS: 100.0000 mg | ORAL_TABLET | Freq: Two times a day (BID) | ORAL | Status: DC

## 2013-03-10 MED ORDER — METHYLPHENIDATE HCL ER (OSM) 54 MG PO TBCR: 54.0000 mg | EXTENDED_RELEASE_TABLET | ORAL | Status: DC

## 2013-03-10 NOTE — Progress Notes (Signed)
Subjective:    Patient ID: Jose Shaffer, male    DOB: 05/09/1970, 42 y.o.   MRN: [id number]  HPI This chart was scribed for [facility] by Smiley Houseman, Scribe. This patient was seen in room 2 and the patient's care was started at 3:52 PM.  HPI Comments: [place] Kuang is a 42 y.o. male with a h/o of ADD who presents to the [facility] needing a medication refill for his ADD.  He is currently taking Concerta.  He reports that Concerta works well for him, but at the end of the day he has an energy drop off.  Pt was taking .5 mg of Klonopin daily, but stopped taking it.  He states that he did have withdrawal effects as he was coming off Concerta, but they have gone away.  Pt states the Klonopin did help him, but shows concern about the addictiveness of the drug.  He states he is having insomnia even though he is not taking the Klonopin.  Pt states he has tried Wellbutrin, not the extended release form, but it did help with insomnia.  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     Patient Active Problem List   Diagnosis Date Noted  . ADD (attention deficit disorder) 09/11/2011  . FATIGUE 04/08/2010  . SHORTNESS OF BREATH (SOB) 04/08/2010     Past Surgical History  Procedure Laterality Date  . Quadriceps tendon repair  08/2008    Family History  Problem Relation Age of Onset  . Hypertension Father   . Cancer Maternal Grandmother 40    breast cancer  . Hypertension Paternal Grandmother   . COPD Paternal Grandfather     lung cancer; tobacco use, Doctor, general practice  . Cancer Maternal Aunt     breast cancer, late 58's  . Cancer Maternal Aunt     breast cancer; diagnosed late 64's    History   Social History  . Marital Status: Single    Spouse Name: n/a    Number of Children: 0  . Years of Education: 12+   Occupational History  . truck driver   . contractor    Social History  Main Topics  . Smoking status: Never Smoker   . Smokeless tobacco: Never Used  . Alcohol Use: No  . Drug Use: No  . Sexual Activity: Yes    Partners: Female    Pharmacist, hospital Protection: None   Other Topics Concern  . Not on file   Social History Narrative   Lives with girlfriend.    Allergies  Allergen Reactions  . Ibuprofen     Review of Systems  Constitutional: Negative for fever and chills.  HENT: Negative for congestion and rhinorrhea.   Respiratory: Negative for cough and shortness of breath.   Cardiovascular: Negative for chest pain.  Gastrointestinal: Negative for nausea, vomiting, abdominal pain and diarrhea.  Musculoskeletal: Negative for back pain.  Skin: Negative for color change and rash.  Psychiatric/Behavioral: The patient is nervous/anxious.        Objective:   Physical Exam  Nursing note and vitals reviewed. Constitutional: He is oriented to person, place, and time. He appears well-developed and well-nourished. No distress.  HENT:  Head: Normocephalic and atraumatic.  Eyes: Conjunctivae and EOM are normal. Right eye exhibits no discharge. Left eye exhibits no discharge.  Neck: Normal range of motion.  Cardiovascular: Normal rate and regular rhythm.  Pulmonary/Chest: Effort normal and breath sounds normal. No respiratory distress.  Musculoskeletal: Normal range of motion.  Neurological: He is alert and oriented to person, place, and time.  Skin: Skin is warm and dry.  Psychiatric: He has a normal mood and affect. His behavior is normal.    Triage Vitals: BP 134/78  Pulse 94  Temp(Src) 98.2 F (36.8 C) (Oral)  Resp 16  Ht 5' 10.75" (1.797 m)  Wt 240 lb (108.863 kg)  BMI 33.71 kg/m2  SpO2 97%  DIAGNOSTIC STUDIES: Oxygen Saturation is 97% on RA, normal by my interpretation.    COORDINATION OF CARE: 4:03 PM-Patient informed of current plan of treatment and evaluation and agrees with plan.        Assessment & Plan:  ADD (attention  deficit disorder) - Plan: methylphenidate (CONCERTA) 54 MG CR tablet, methylphenidate (CONCERTA) 54 MG CR tablet, methylphenidate (CONCERTA) 54 MG CR tablet  Insomnia  Anxiety state, unspecified  Advised against benzos for use for agitation as concerta wears off---will use bupropion since he has insomnia likely related to anxiety Meds ordered this encounter  Medications  . DISCONTD: buPROPion (WELLBUTRIN XL) 300 MG 24 hr tablet    Sig: Take 1 tablet (300 mg total) by mouth daily.    Dispense:  30 tablet    Refill:  5  . buPROPion (WELLBUTRIN) 100 MG tablet    Sig: Take 1 tablet (100 mg total) by mouth 2 (two) times daily.    Dispense:  60 tablet    Refill:  5    I have completed the patient encounter in its entirety as documented by the scribe, with editing by me where necessary. Isatu Macinnes P. Merla Riches, M.D.

## 2013-05-27 ENCOUNTER — Ambulatory Visit (INDEPENDENT_AMBULATORY_CARE_PROVIDER_SITE_OTHER): Payer: BC Managed Care – PPO | Admitting: Internal Medicine

## 2013-05-27 VITALS — BP 136/88 | HR 98 | Temp 98.5°F | Resp 18 | Ht 71.0 in | Wt 237.0 lb

## 2013-05-27 DIAGNOSIS — M79609 Pain in unspecified limb: Secondary | ICD-10-CM

## 2013-05-27 DIAGNOSIS — M79644 Pain in right finger(s): Secondary | ICD-10-CM

## 2013-05-27 DIAGNOSIS — S61019A Laceration without foreign body of unspecified thumb without damage to nail, initial encounter: Secondary | ICD-10-CM

## 2013-05-27 DIAGNOSIS — Z23 Encounter for immunization: Secondary | ICD-10-CM

## 2013-05-27 DIAGNOSIS — S61209A Unspecified open wound of unspecified finger without damage to nail, initial encounter: Secondary | ICD-10-CM

## 2013-05-27 NOTE — Progress Notes (Signed)
   Subjective:    Patient ID: Jose Shaffer, male    DOB: Aug 25, 1970, 43 y.o.   MRN: [id number]  HPI    Review of Systems     Objective:   Physical Exam    Procedure explained and verbal consent obtained. Wound cleaned and dressed sterilely. 1 percent lidocaine injected locally to wound edges.   Sutured with 4.0 prolene #3 sutures simple interrupted . Wound dressed with nonstick sterile dressing.    Assessment & Plan:  After soaking in ns wound reopened and examined in greater detail. Wound extends .5 centimeters deep and is bleeding again and it is at the joint so decided to suture the laceration.

## 2013-05-27 NOTE — Patient Instructions (Addendum)
Wash dry and dress daily. Dry and apply topical antibiotic and dressing. Keep dry and clean. Any infection return to the office.suture removal need to be done in 7 days. Return to the office in 7 days.

## 2013-05-27 NOTE — Progress Notes (Signed)
   Subjective:    Patient ID: Jose AllegraChristopher Shaffer, male    DOB: [birth year], 43 y.o.   MRN: [id number]  HPI 43 year old gentleman with self inflicted accidental cut to the right thumb with a box knife while cutting pvc pipe at home with a box knife. Recently changed the blade and never used to cut animal or plant product. Bleeding controlled with 20 minutes of pressure applied by patient. Last tetanus shot was at age 43. Not immune compromised.   Review of Systems  Skin: Positive for wound.       Right thumb at ip joint  All other systems reviewed and are negative.       Objective:   Physical Exam  Nursing note and vitals reviewed. Constitutional: He is oriented to person, place, and time. He appears well-developed and well-nourished.  HENT:  Head: Normocephalic and atraumatic.  Nose: Nose normal.  Mouth/Throat: Oropharynx is clear and moist.  Eyes: Conjunctivae and EOM are normal. Pupils are equal, round, and reactive to light.  Neck: Normal range of motion. Neck supple.  Cardiovascular: Normal rate and regular rhythm.   Pulmonary/Chest: Effort normal.  Musculoskeletal: Normal range of motion.  Neurological: He is alert and oriented to person, place, and time.  Skin: Skin is warm and dry.  Laceration of thumb r hand at the ip jointmedially. Not bleeding at present . Laceration is .5 cm in length. Normal tendon function normal motor and sensory function without deficit    Psychiatric: He has a normal mood and affect. His behavior is normal. Judgment normal.          Assessment & Plan:  Superficial laceration to the thumb no tendon or nerve deficit. Pt need tetenus immunization and will soak in ns/h2o2 and irrigate wound to ensure cleaning. Local wound care with washing drying and topical antibitoic cream and dressing.

## 2013-05-29 NOTE — Addendum Note (Signed)
Addended by: Ethelda ChickSMITH, KRISTI M on: 05/29/2013 02:02 PM   Modules accepted: Level of Service

## 2013-07-14 ENCOUNTER — Ambulatory Visit (INDEPENDENT_AMBULATORY_CARE_PROVIDER_SITE_OTHER): Payer: BC Managed Care – PPO | Admitting: Emergency Medicine

## 2013-07-14 VITALS — BP 130/90 | HR 87 | Temp 98.2°F | Resp 12 | Ht 71.0 in | Wt 235.2 lb

## 2013-07-14 DIAGNOSIS — F411 Generalized anxiety disorder: Secondary | ICD-10-CM

## 2013-07-14 DIAGNOSIS — F419 Anxiety disorder, unspecified: Secondary | ICD-10-CM

## 2013-07-14 DIAGNOSIS — F988 Other specified behavioral and emotional disorders with onset usually occurring in childhood and adolescence: Secondary | ICD-10-CM

## 2013-07-14 MED ORDER — METHYLPHENIDATE HCL ER (OSM) 54 MG PO TBCR: 54.0000 mg | EXTENDED_RELEASE_TABLET | ORAL | Status: DC

## 2013-07-14 MED ORDER — CLONAZEPAM 0.5 MG PO TABS: 1.0000 mg | ORAL_TABLET | Freq: Every day | ORAL | Status: DC

## 2013-07-14 NOTE — Progress Notes (Signed)
[facility] [address], [place] [address] [phone]  Date:  07/14/2013   Name:  Jose Shaffer   DOB:  1970/04/12   MRN:  [id number]  PCP:  No primary provider on file.    Chief Complaint: Medication Refill and Discuss medication   History of Present Illness:  Jose Shaffer is a 43 y.o. very pleasant male patient who presents with the following:  ADD and has been off medications and requests reinstatement of the meds due to his job demands. tolerated medication well with no adverse effects.  Denies other complaint or health concern today.   Patient Active Problem List   Diagnosis Date Noted  . Insomnia 03/10/2013  . Anxiety state, unspecified 03/10/2013  . ADD (attention deficit disorder) 09/11/2011  . FATIGUE 04/08/2010  . SHORTNESS OF BREATH (SOB) 04/08/2010    Past Medical History  Diagnosis Date  . Allergy   . ADD (attention deficit disorder)   . Insomnia   . Anxiety state, unspecified 03/10/2013    Past Surgical History  Procedure Laterality Date  . Quadriceps tendon repair  08/2008    History  Substance Use Topics  . Smoking status: Never Smoker   . Smokeless tobacco: Never Used  . Alcohol Use: No    Family History  Problem Relation Age of Onset  . Hypertension Father   . Cancer Maternal Grandmother 61    breast cancer  . Hypertension Paternal Grandmother   . COPD Paternal Grandfather     lung cancer; tobacco use, Doctor, general practice  . Cancer Maternal Aunt     breast cancer, late 11's  . Cancer Maternal Aunt     breast cancer; diagnosed late 50's    Allergies  Allergen Reactions  . Ibuprofen     Medication list has been reviewed and updated.  Current Outpatient Prescriptions on File Prior to Visit  Medication Sig Dispense Refill  . buPROPion (WELLBUTRIN) 100 MG tablet Take 1 tablet (100 mg total) by mouth 2 (two) times daily.  60 tablet  5  . methylphenidate (CONCERTA) 54 MG CR tablet Take 1 tablet (54  mg total) by mouth every morning.  30 tablet  0  . methylphenidate (CONCERTA) 54 MG CR tablet Take 1 tablet (54 mg total) by mouth every morning. [place] 30 days after signing date  30 tablet  0  . methylphenidate (CONCERTA) 54 MG CR tablet Take 1 tablet (54 mg total) by mouth every morning. [place] 60 days after signing date  30 tablet  0  . clonazePAM (KLONOPIN) 0.5 MG tablet Take 2 tablets (1 mg total) by mouth daily.  30 tablet  2   No current facility-administered medications on file prior to visit.    Review of Systems:  As per HPI, otherwise negative.    Physical Examination: Filed Vitals:   07/14/13 0943  BP: 130/90  Pulse: 87  Temp: 98.2 F (36.8 C)  Resp: 12   Filed Vitals:   07/14/13 0943  Height: 5\' 11"  (1.803 m)  Weight: 235 lb 3.2 oz (106.686 kg)   Body mass index is 32.82 kg/(m^2). Ideal Body Weight: Weight in (lb) to have BMI = 25: 178.9   GEN: WDWN, NAD, Non-toxic, Alert & Oriented x 3 HEENT: Atraumatic, Normocephalic.  Ears and Nose: No external deformity. EXTR: No clubbing/cyanosis/edema NEURO: Normal gait.  PSYCH: Normally interactive. Conversant. Not depressed or anxious appearing.  Calm demeanor.    Assessment and Plan: ADD  Signed,  Phillips Odor,  MD

## 2013-07-14 NOTE — Patient Instructions (Signed)
Attention Deficit Hyperactivity Disorder Attention deficit hyperactivity disorder (ADHD) is a problem with behavior issues based on the way the brain functions (neurobehavioral disorder). It is a common reason for behavior and academic problems in school. SYMPTOMS  There are 3 types of ADHD. The 3 types and some of the symptoms include:  Inattentive  Gets bored or distracted easily.  Loses or forgets things. Forgets to hand in homework.  Has trouble organizing or completing tasks.  Difficulty staying on task.  An inability to organize daily tasks and school work.  Leaving projects, chores, or homework unfinished.  Trouble paying attention or responding to details. Careless mistakes.  Difficulty following directions. Often seems like is not listening.  Dislikes activities that require sustained attention (like chores or homework).  Hyperactive-impulsive  Feels like it is impossible to sit still or stay in a seat. Fidgeting with hands and feet.  Trouble waiting turn.  Talking too much or out of turn. Interruptive.  Speaks or acts impulsively.  Aggressive, disruptive behavior.  Constantly busy or on the go, noisy.  Often leaves seat when they are expected to remain seated.  Often runs or climbs where it is not appropriate, or feels very restless.  Combined  Has symptoms of both of the above. Often children with ADHD feel discouraged about themselves and with school. They often perform well below their abilities in school. As children get older, the excess motor activities can calm down, but the problems with paying attention and staying organized persist. Most children do not outgrow ADHD but with good treatment can learn to cope with the symptoms. DIAGNOSIS  When ADHD is suspected, the diagnosis should be made by professionals trained in ADHD. This professional will collect information about the individual suspected of having ADHD. Information must be collected from  various settings where the person lives, works, or attends school.  Diagnosis will include:  Confirming symptoms began in childhood.  Ruling out other reasons for the child's behavior.  The health care providers will check with the child's school and check their medical records.  They will talk to teachers and parents.  Behavior rating scales for the child will be filled out by those dealing with the child on a daily basis. A diagnosis is made only after all information has been considered. TREATMENT  Treatment usually includes behavioral treatment, tutoring or extra support in school, and stimulant medicines. Because of the way a person's brain works with ADHD, these medicines decrease impulsivity and hyperactivity and increase attention. This is different than how they would work in a person who does not have ADHD. Other medicines used include antidepressants and certain blood pressure medicines. Most experts agree that treatment for ADHD should address all aspects of the person's functioning. Along with medicines, treatment should include structured classroom management at school. Parents should reward good behavior, provide constant discipline, and limit-setting. Tutoring should be available for the child as needed. ADHD is a life-long condition. If untreated, the disorder can have long-term serious effects into adolescence and adulthood. HOME CARE INSTRUCTIONS   Often with ADHD there is a lot of frustration among family members dealing with the condition. Blame and anger are also feelings that are common. In many cases, because the problem affects the family as a whole, the entire family may need help. A therapist can help the family find better ways to handle the disruptive behaviors of the person with ADHD and promote change. If the person with ADHD is young, most of the therapist's work   is with the parents. Parents will learn techniques for coping with and improving their child's  behavior. Sometimes only the child with the ADHD needs counseling. Your health care providers can help you make these decisions.  Children with ADHD may need help learning how to organize. Some helpful tips include:  Keep routines the same every day from wake-up time to bedtime. Schedule all activities, including homework and playtime. Keep the schedule in a place where the person with ADHD will often see it. Mark schedule changes as far in advance as possible.  Schedule outdoor and indoor recreation.  Have a place for everything and keep everything in its place. This includes clothing, backpacks, and school supplies.  Encourage writing down assignments and bringing home needed books. Work with your child's teachers for assistance in organizing school work.  Offer your child a well-balanced diet. Breakfast that includes a balance of whole grains, protein and, fruits or vegetables is especially important for school performance. Children should avoid drinks with caffeine including:  Soft drinks.  Coffee.  Tea.  However, some older children (adolescents) may find these drinks helpful in improving their attention. Because it can also be common for adolescents with ADHD to become addicted to caffeine, talk with your health care provider about what is a safe amount of caffeine intake for your child.  Children with ADHD need consistent rules that they can understand and follow. If rules are followed, give small rewards. Children with ADHD often receive, and expect, criticism. Look for good behavior and praise it. Set realistic goals. Give clear instructions. Look for activities that can foster success and self-esteem. Make time for pleasant activities with your child. Give lots of affection.  Parents are their children's greatest advocates. Learn as much as possible about ADHD. This helps you become a stronger and better advocate for your child. It also helps you educate your child's teachers and  instructors if they feel inadequate in these areas. Parent support groups are often helpful. A national group with local chapters is called Children and Adults with Attention Deficit Hyperactivity Disorder (CHADD). SEEK MEDICAL CARE IF:  Your child has repeated muscle twitches, cough or speech outbursts.  Your child has sleep problems.  Your child has a marked loss of appetite.  Your child develops depression.  Your child has new or worsening behavioral problems.  Your child develops dizziness.  Your child has a racing heart.  Your child has stomach pains.  Your child develops headaches. SEEK IMMEDIATE MEDICAL CARE IF:  Your child has been diagnosed with depression or anxiety and the symptoms seem to be getting worse.  Your child has been depressed and suddenly appears to have increased energy or motivation.  You are worried that your child is having a bad reaction to a medication he or she is taking for ADHD. Document Released: 02/18/2002 Document Revised: 12/19/2012 Document Reviewed: 11/05/2012 [employer] Patient Information 2014 [employer].  

## 2013-10-14 ENCOUNTER — Ambulatory Visit (INDEPENDENT_AMBULATORY_CARE_PROVIDER_SITE_OTHER): Payer: BC Managed Care – PPO | Admitting: Family Medicine

## 2013-10-14 VITALS — BP 132/90 | HR 82 | Temp 98.4°F | Resp 12 | Ht 70.75 in | Wt 232.0 lb

## 2013-10-14 DIAGNOSIS — F988 Other specified behavioral and emotional disorders with onset usually occurring in childhood and adolescence: Secondary | ICD-10-CM

## 2013-10-14 DIAGNOSIS — G4700 Insomnia, unspecified: Secondary | ICD-10-CM

## 2013-10-14 DIAGNOSIS — F411 Generalized anxiety disorder: Secondary | ICD-10-CM

## 2013-10-14 MED ORDER — CLONAZEPAM 0.5 MG PO TABS: 1.0000 mg | ORAL_TABLET | Freq: Every day | ORAL | Status: DC

## 2013-10-14 MED ORDER — METHYLPHENIDATE HCL ER (OSM) 54 MG PO TBCR: 54.0000 mg | EXTENDED_RELEASE_TABLET | ORAL | Status: DC

## 2013-10-14 NOTE — Progress Notes (Signed)
Subjective:   This chart was scribed for [name] MD by Arlan Organ, [facility] Scribe. This patient was seen in room 8 and the patient's care was started 8:18 PM.    Patient ID: Jose Shaffer, male    DOB: Nov 21, 1970, 43 y.o.   MRN: [id number]  Chief Complaint  Patient presents with  . Medication Refill    methylphenidate & klonopin    HPI  HPI Comments: Jose Shaffer is a 43 y.o. Male with a PMHx of ADD who presents to [facility] for Methylphenidate and Klonopin refill today. States he has been on this combination of medications for several years now. Pt was diagnosed with ADD about 12 years ago by a provider who no longer practices. He denies any issues sleeping at night time. No recent weight changes. Pt is not followed by a PCP at this time and has been coming to [facility] for all medical needs for several years. He current works as a Insurance account manager. Pt with known allergy to Ibuprofen. No other concerns this visit.  Past Medical History  Diagnosis Date  . Allergy   . ADD (attention deficit disorder)   . Insomnia   . Anxiety state, unspecified 03/10/2013    Current Outpatient Prescriptions on File Prior to Visit  Medication Sig Dispense Refill  . clonazePAM (KLONOPIN) 0.5 MG tablet Take 2 tablets (1 mg total) by mouth daily.  30 tablet  2  . methylphenidate 54 MG PO CR tablet Take 1 tablet (54 mg total) by mouth every morning.  30 tablet  0  . methylphenidate 54 MG PO CR tablet Take 1 tablet (54 mg total) by mouth every morning. [place] 30 days after signing date  30 tablet  0  . methylphenidate 54 MG PO CR tablet Take 1 tablet (54 mg total) by mouth every morning. [place] 60 days after signing date  30 tablet  0   No current facility-administered medications on file prior to visit.    Allergies  Allergen Reactions  . Ibuprofen      Review of Systems  Constitutional: Negative for activity change and appetite  change.  Psychiatric/Behavioral: Negative for sleep disturbance.     Objective:  Physical Exam  Nursing note and vitals reviewed. Constitutional: He is oriented to person, place, and time. He appears well-developed and well-nourished.  HENT:  Head: Normocephalic.  Eyes: EOM are normal.  Neck: Normal range of motion. No thyromegaly present.  Cardiovascular: Normal rate, regular rhythm, S1 normal, S2 normal and normal heart sounds.   No murmur heard. Pulmonary/Chest: Effort normal and breath sounds normal.  Abdominal: He exhibits no distension.  Musculoskeletal: Normal range of motion.  Lymphadenopathy:    He has no cervical adenopathy.  Neurological: He is alert and oriented to person, place, and time.  Psychiatric: He has a normal mood and affect.   [place] database was reviewed and no fills by other providers Assessment & Plan:   Pt will establish with me as PCP and will schedule next follow up appointment when our office called during business hours. Insomnia  Anxiety state, unspecified  ADD (attention deficit disorder) - Plan: clonazePAM (KLONOPIN) 0.5 MG tablet, methylphenidate 54 MG PO CR tablet, methylphenidate 54 MG PO CR tablet, methylphenidate 54 MG PO CR tablet  Meds ordered this encounter  Medications  . clonazePAM (KLONOPIN) 0.5 MG tablet    Sig: Take 2 tablets (1 mg total) by mouth daily.    Dispense:  30 tablet    Refill:  2  . methylphenidate 54 MG PO CR tablet    Sig: Take 1 tablet (54 mg total) by mouth every morning.    Dispense:  30 tablet    Refill:  0  . methylphenidate 54 MG PO CR tablet    Sig: Take 1 tablet (54 mg total) by mouth every morning. May fill 30 days after signing date    Dispense:  30 tablet    Refill:  0  . methylphenidate 54 MG PO CR tablet    Sig: Take 1 tablet (54 mg total) by mouth every morning. May fill 60 days after signing date    Dispense:  30 tablet    Refill:  0    I personally performed the services described in this  documentation, which was scribed in my presence. The recorded information has been reviewed and considered, and addended by me as needed.  Norberto SorensonEva Shaw, MD MPH

## 2013-10-22 NOTE — Progress Notes (Signed)
Left a message for patient to return call to schedule a follow up visit with Dr. Clelia CroftShaw to establish pcp

## 2013-11-19 ENCOUNTER — Ambulatory Visit (INDEPENDENT_AMBULATORY_CARE_PROVIDER_SITE_OTHER): Payer: BC Managed Care – PPO | Admitting: Family Medicine

## 2013-11-19 VITALS — BP 130/84 | HR 85 | Temp 98.0°F | Resp 16 | Ht 71.0 in | Wt 232.4 lb

## 2013-11-19 DIAGNOSIS — J3489 Other specified disorders of nose and nasal sinuses: Secondary | ICD-10-CM

## 2013-11-19 MED ORDER — VALACYCLOVIR HCL 1 G PO TABS: ORAL_TABLET | ORAL | Status: DC

## 2013-11-19 MED ORDER — AMOXICILLIN-POT CLAVULANATE 875-125 MG PO TABS: 1.0000 | ORAL_TABLET | Freq: Two times a day (BID) | ORAL | Status: DC

## 2013-11-19 NOTE — Progress Notes (Signed)
Subjective: For about a week the patient has had a sore place with the tip of his nose. He popped and initial blistered lesion. Then it bled a lot and form a blood blister under the skin. It has remained a crusted area. Not real painful.  Objective: Crusted swollen left tip of nose about 1 cm in diameter  Assessment: Infection on nose  Plan: Pricked it with a needle and get a culture. Get an HSV titer  Treat with Valtrex and Augmentin in case this is a viral sore, but probably is not. He was concerned about cancer but I tried to reassure him that I have no concern that at this time. If it persists he is to come back.

## 2013-11-19 NOTE — Patient Instructions (Signed)
Take the antibiotic, Augmentin (amoxicillin clavulanate) one twice daily with food  Take the antiviral, Valtrex (valacyclovir) one twice daily  If the place gets worse at anytime please return  If everything does not look well cleared up over the next 2 weeks is come back for a recheck.

## 2013-11-20 LAB — HSV(HERPES SIMPLEX VRS) I + II AB-IGG: HSV 1 GLYCOPROTEIN G AB, IGG: 1.72 IV — AB

## 2013-11-22 LAB — WOUND CULTURE
Gram Stain: NONE SEEN
Gram Stain: NONE SEEN
Gram Stain: NONE SEEN

## 2014-01-10 ENCOUNTER — Encounter: Payer: Self-pay | Admitting: Family Medicine

## 2014-01-10 ENCOUNTER — Ambulatory Visit (INDEPENDENT_AMBULATORY_CARE_PROVIDER_SITE_OTHER): Payer: BC Managed Care – PPO | Admitting: Family Medicine

## 2014-01-10 VITALS — BP 120/86 | HR 89 | Temp 97.2°F | Resp 16 | Ht 71.5 in | Wt 235.6 lb

## 2014-01-10 DIAGNOSIS — F411 Generalized anxiety disorder: Secondary | ICD-10-CM

## 2014-01-10 DIAGNOSIS — F909 Attention-deficit hyperactivity disorder, unspecified type: Secondary | ICD-10-CM

## 2014-01-10 DIAGNOSIS — G4700 Insomnia, unspecified: Secondary | ICD-10-CM

## 2014-01-10 DIAGNOSIS — F988 Other specified behavioral and emotional disorders with onset usually occurring in childhood and adolescence: Secondary | ICD-10-CM

## 2014-01-10 MED ORDER — CLONAZEPAM 0.5 MG PO TABS: 0.5000 mg | ORAL_TABLET | Freq: Two times a day (BID) | ORAL | Status: DC | PRN

## 2014-01-10 MED ORDER — AMPHETAMINE-DEXTROAMPHET ER 20 MG PO CP24
20.0000 mg | ORAL_CAPSULE | ORAL | Status: DC
Start: 2014-01-10 — End: 2014-02-21

## 2014-01-10 NOTE — Progress Notes (Signed)
ADD (attention deficit disorder) - Plan: clonazePAM (KLONOPIN) 0.5 MG tablet - changed from concerta 54 qd to adderall 20 qd - may need to increase dose at f/u. Will hopefully need less klonopin if not having as much irritability from concerta wearing off but increased klonopin from #30/mo to #45/mo - if needing #60/mo ok to call in for early refill.  Needs to be seen for further refills on stimulant therapy. If irritatiliby or insomnia persist as issues may want to consider augmenting with Wellbutrin, snri, or other sleep med.  Anxiety state  Insomnia  Meds ordered this encounter  Medications  . amphetamine-dextroamphetamine (ADDERALL XR) 20 MG 24 hr capsule    Sig: Take 1 capsule (20 mg total) by mouth every morning.    Dispense:  30 capsule    Refill:  0  . amphetamine-dextroamphetamine (ADDERALL XR) 20 MG 24 hr capsule    Sig: Take 1 capsule (20 mg total) by mouth every morning. May fill 30 days from date written    Dispense:  30 capsule    Refill:  0  . amphetamine-dextroamphetamine (ADDERALL XR) 20 MG 24 hr capsule    Sig: Take 1 capsule (20 mg total) by mouth every morning. May fill 60d from date written    Dispense:  30 capsule    Refill:  0  . clonazePAM (KLONOPIN) 0.5 MG tablet    Sig: Take 1 tablet (0.5 mg total) by mouth 2 (two) times daily as needed for anxiety.    Dispense:  45 tablet    Refill:  2    Pt independently assessed, reviewed documentation and agree w/ assessment and plan. Norberto SorensonEva Jerimah Witucki, MD MPH

## 2014-01-10 NOTE — Progress Notes (Signed)
Subjective:    Patient ID: Jose Shaffer, male    DOB: 1970/07/22, 43 y.o.   MRN: [id number]  HPI  Jose Shaffer is a 43 y.o. male presenting for follow up on ADD and insomnia.  ADD - patient has been on Concerta for ~15 years, reports compliance with medication, doubled dose once this past month by mistake, which made him notice that Concerta may be losing its efficacy. It has its initial effect but is waning a lot earlier, feels like it's just a strong dose of caffeine. As it wears off, patient reports difficulty with planning, organizing, following through, procrastination but when he doubled the dose by mistake noticed this was not the case; he was able to be effective and didn't feel "idle". He also reports becoming irritable and agitated earlier in the evening ~5pm as Concerta is wears off. States that he has not had enough Klonopin to "bridge this and his sleep" and therefore has been using marijuana nightly to calm down. He does deny smoking or drinking alcohol, understands importance of staying away from alcohol and admits smoking pot is not what he needs to be doing.   Of note, previously Jose Shaffer has come off of ADD medicine for variable periods of time and when he would restart the medication, he felt it would regain its efficacy. Has tried Adderall, felt like it worked; also tried Theatre manager but it made him sleepy. Denies chest pain, sob, palpitations, n/v, diarrhea, decreased appetite, headaches, abdominal pain.    Prior to Admission medications   Medication Sig Start Date End Date Taking? Authorizing Provider  clonazePAM (KLONOPIN) 0.5 MG tablet Take 1 tablet (0.5 mg total) by mouth 2 (two) times daily as needed for anxiety. 01/10/14 07/06/14 Yes Sherren Mocha, MD  amphetamine-dextroamphetamine (ADDERALL XR) 20 MG 24 hr capsule Take 1 capsule (20 mg total) by mouth every morning. 01/10/14   Sherren Mocha, MD  amphetamine-dextroamphetamine (ADDERALL XR) 20 MG 24 hr capsule Take 1  capsule (20 mg total) by mouth every morning. May fill 30 days from date written 01/10/14   Sherren Mocha, MD  amphetamine-dextroamphetamine (ADDERALL XR) 20 MG 24 hr capsule Take 1 capsule (20 mg total) by mouth every morning. May fill 60d from date written 01/10/14   Sherren Mocha, MD    Allergies  Allergen Reactions  . Ibuprofen     Past Medical History  Diagnosis Date  . Allergy   . ADD (attention deficit disorder)   . Insomnia   . Anxiety state, unspecified 03/10/2013    Review of Systems As in subjective.    Objective:   Physical Exam  Constitutional: He appears well-developed and well-nourished. No distress.  BP 120/86  Pulse 89  Temp(Src) 97.2 F (36.2 C) (Oral)  Resp 16  Ht 5' 11.5" (1.816 m)  Wt 235 lb 9.6 oz (106.867 kg)  BMI 32.40 kg/m2  SpO2 96%   Neck: Normal range of motion. Neck supple.  Cardiovascular: Normal rate, regular rhythm, normal heart sounds and intact distal pulses.  Exam reveals no gallop and no friction rub.   No murmur heard. Pulmonary/Chest: Effort normal and breath sounds normal. No respiratory distress. He has no wheezes. He exhibits no tenderness.  Abdominal: Soft. Bowel sounds are normal. He exhibits no mass. There is no tenderness.  Musculoskeletal: Normal range of motion. He exhibits no edema and no tenderness.  Skin: Skin is warm and dry. No rash noted. He is not diaphoretic.  Psychiatric: He has a  normal mood and affect. His behavior is normal.      Assessment & Plan:   1. ADD (attention deficit disorder) 2. Anxiety state 3. Insomnia - Not controlled, suspect tolerance to Concerta, will switch to Adderall XR 20mg , can titrate up as needed in follow up visits, spent over 25 minutes of face to face time developing a plan and counseling regarding appropriate use of his medications and marijuana use, advised that we will perform drug screening at next visit, patient agreed to not use marijuana, will follow up in 3 months or sooner if  necessary - Adderall XR 20mg  PO QD  - clonazePAM (KLONOPIN) 0.5 MG tablet; Take 1 tablet (0.5 mg total) by mouth 2 (two) times daily as needed for anxiety.  Dispense: 45 tablet; Refill: 2   Jose Bamberg, PA-C [facility] [phone] 01/10/2014 9:14 AM

## 2014-01-10 NOTE — Patient Instructions (Signed)
[facility] Policy for Prescribing Controlled Substances (Revised 01/2012) 1. Prescriptions for controlled substances will be filled by ONE provider at [facility] with whom you have established and developed a plan for your care, including follow-up. 2. You are encouraged to schedule an appointment with your prescriber at our appointment center for follow-up visits whenever possible. 3. If you request a prescription for the controlled substance while at [facility] for an acute problem (with someone other than your regular prescriber), you MAY be given a ONE-TIME prescription for a 30-day supply of the controlled substance, to allow time for you to return to see your regular prescriber for additional prescriptions. 

## 2014-02-17 ENCOUNTER — Telehealth: Payer: Self-pay | Admitting: *Deleted

## 2014-02-17 NOTE — Telephone Encounter (Signed)
Spoke to pt- he would like to come in and talk to Dr. Clelia CroftShaw about this sooner. Advised him of her schedule this week.

## 2014-02-17 NOTE — Telephone Encounter (Signed)
Patient called and stated he does not feel his medication (Adderall) is working well enough.  He would like Dr. Clelia CroftShaw to call him @ [phone].   He did make an appt on 12/11 @ 3:45 pm.

## 2014-02-21 ENCOUNTER — Ambulatory Visit (INDEPENDENT_AMBULATORY_CARE_PROVIDER_SITE_OTHER): Payer: BC Managed Care – PPO | Admitting: Family Medicine

## 2014-02-21 ENCOUNTER — Encounter: Payer: Self-pay | Admitting: Family Medicine

## 2014-02-21 VITALS — BP 126/80 | HR 100 | Temp 98.1°F | Resp 16 | Ht 71.0 in | Wt 238.2 lb

## 2014-02-21 DIAGNOSIS — F988 Other specified behavioral and emotional disorders with onset usually occurring in childhood and adolescence: Secondary | ICD-10-CM

## 2014-02-21 DIAGNOSIS — R0683 Snoring: Secondary | ICD-10-CM

## 2014-02-21 DIAGNOSIS — F909 Attention-deficit hyperactivity disorder, unspecified type: Secondary | ICD-10-CM

## 2014-02-21 DIAGNOSIS — Z23 Encounter for immunization: Secondary | ICD-10-CM

## 2014-02-21 DIAGNOSIS — R0600 Dyspnea, unspecified: Secondary | ICD-10-CM

## 2014-02-21 MED ORDER — AMPHETAMINE-DEXTROAMPHET ER 30 MG PO CP24: 30.0000 mg | ORAL_CAPSULE | Freq: Every day | ORAL | Status: DC

## 2014-02-21 NOTE — Progress Notes (Signed)
MRN: [id number] DOB: [birth year]  Subjective:   Jose Shaffer is a 43 y.o. male presenting for follow up on his ADD. At his last visit 01/10/2014, patient was switched from Concerta to Adderall XR 20mg  d/t lack of efficacy with Concerta. Plan was to follow up in 3 months and consider increasing dose to Adderall XR 30mg . Patient is now presenting d/t difficulty staying organized, staying focused in conversations, irritability with his wife. States that he tried a second dose of his Adderall once this week and felt that he did very well that day. Denies doubling doses otherwise and has only obtained one refill with several pills left from that refill. Patient states that he has tried multiple ADD medications in the past and has even come off of them for a period of time. He has also tried Wellbutrin alone and with stimulant medications which really affected his sleep. Denies smoking, denies alcohol use or other drug use including marijuana.   Of note, patient reports snoring at night. Admits that this has woken up and interrupted both his and his wife's sleeping. States that he wakes up feeling smothered and that his wife has even witnessed him stop breathing before he awakens as such. Also admits feeling lethargic and not rested even with adequate sleep of 8-9 hours. Denies chest pain, palpitations, heart racing, shob. Patient has never been evaluated for a sleep study nor diagnosed with sleep apnea. Denies any other aggravating or relieving factors, no other questions or concerns.  Jose Shaffer has a current medication list which includes the following prescription(s): amphetamine-dextroamphetamine, amphetamine-dextroamphetamine, amphetamine-dextroamphetamine, and clonazepam.  He is allergic to ibuprofen.  Jose Shaffer  has a past medical history of Allergy; ADD (attention deficit disorder); Insomnia; and Anxiety state, unspecified (03/10/2013). Also  has past surgical history that includes  Quadriceps tendon repair (08/2008).  ROS As in subjective.  Objective:   Vitals: BP 126/80 mmHg  Pulse 100  Temp(Src) 98.1 F (36.7 C) (Oral)  Resp 16  Ht 5\' 11"  (1.803 m)  Wt 238 lb 3.2 oz (108.047 kg)  BMI 33.24 kg/m2  SpO2 98%  Physical Exam  Constitutional: He is oriented to person, place, and time and well-developed, well-nourished, and in no distress.  HENT:  Mouth/Throat: Oropharynx is clear and moist. No oropharyngeal exudate (no tonsillar enlargement).  Neck: Normal range of motion. Neck supple. No thyromegaly present.  Lymphadenopathy:    He has no cervical adenopathy.  Neurological: He is alert and oriented to person, place, and time.  Skin: Skin is warm and dry. No rash noted. He is not diaphoretic. No erythema.  Psychiatric: Mood and affect normal.   Assessment and Plan :   1. ADD (attention deficit disorder) - Increase Adderall XR from 20mg  to 30mg  - Symptoms may be due to sleep apnea - will f/u in 1 month or after completion of sleep study  2. Snoring 3. PND (paroxysmal nocturnal dyspnea) - Symptoms may be due to sleep apnea, possibly related to difficulty concentrating/ADD symptoms - Ambulatory referral to Neurology to complete sleep study - f/u as above  4. Need for prophylactic vaccination and inoculation against influenza - Flu Vaccine QUAD 36+ mos IM   Wallis Bamberg, PA-C [facility] [phone] 02/21/2014 4:36 PM

## 2014-02-21 NOTE — Patient Instructions (Signed)
Sleep Apnea  Sleep apnea is a sleep disorder characterized by abnormal pauses in breathing while you sleep. When your breathing pauses, the level of oxygen in your blood decreases. This causes you to move out of deep sleep and into light sleep. As a result, your quality of sleep is poor, and the system that carries your blood throughout your body (cardiovascular system) experiences stress. If sleep apnea remains untreated, the following conditions can develop:  High blood pressure (hypertension).  Coronary artery disease.  Inability to achieve or maintain an erection (impotence).  Impairment of your thought process (cognitive dysfunction). There are three types of sleep apnea:  Obstructive sleep apnea--Pauses in breathing during sleep because of a blocked airway.  Central sleep apnea--Pauses in breathing during sleep because the area of the brain that controls your breathing does not send the correct signals to the muscles that control breathing.  Mixed sleep apnea--A combination of both obstructive and central sleep apnea. RISK FACTORS The following risk factors can increase your risk of developing sleep apnea:  Being overweight.  Smoking.  Having narrow passages in your nose and throat.  Being of older age.  Being male.  Alcohol use.  Sedative and tranquilizer use.  Ethnicity. Among individuals younger than 35 years, African Americans are at increased risk of sleep apnea. SYMPTOMS   Difficulty staying asleep.  Daytime sleepiness and fatigue.  Loss of energy.  Irritability.  Loud, heavy snoring.  Morning headaches.  Trouble concentrating.  Forgetfulness.  Decreased interest in sex. DIAGNOSIS  In order to diagnose sleep apnea, your caregiver will perform a physical examination. Your caregiver may suggest that you take a home sleep test. Your caregiver may also recommend that you spend the night in a sleep lab. In the sleep lab, several monitors record  information about your heart, lungs, and brain while you sleep. Your leg and arm movements and blood oxygen level are also recorded. TREATMENT The following actions may help to resolve mild sleep apnea:  Sleeping on your side.   Using a decongestant if you have nasal congestion.   Avoiding the use of depressants, including alcohol, sedatives, and narcotics.   Losing weight and modifying your diet if you are overweight. There also are devices and treatments to help open your airway:  Oral appliances. These are custom-made mouthpieces that shift your lower jaw forward and slightly open your bite. This opens your airway.  Devices that create positive airway pressure. This positive pressure "splints" your airway open to help you breathe better during sleep. The following devices create positive airway pressure:  Continuous positive airway pressure (CPAP) device. The CPAP device creates a continuous level of air pressure with an air pump. The air is delivered to your airway through a mask while you sleep. This continuous pressure keeps your airway open.  Nasal expiratory positive airway pressure (EPAP) device. The EPAP device creates positive air pressure as you exhale. The device consists of single-use valves, which are inserted into each nostril and held in place by adhesive. The valves create very little resistance when you inhale but create much more resistance when you exhale. That increased resistance creates the positive airway pressure. This positive pressure while you exhale keeps your airway open, making it easier to breath when you inhale again.  Bilevel positive airway pressure (BPAP) device. The BPAP device is used mainly in patients with central sleep apnea. This device is similar to the CPAP device because it also uses an air pump to deliver continuous air pressure  through a mask. However, with the BPAP machine, the pressure is set at two different levels. The pressure when you  exhale is lower than the pressure when you inhale.  Surgery. Typically, surgery is only done if you cannot comply with less invasive treatments or if the less invasive treatments do not improve your condition. Surgery involves removing excess tissue in your airway to create a wider passage way. Document Released: 02/18/2002 Document Revised: 06/25/2012 Document Reviewed: 07/07/2011 Exit[employer] Patient Information 2015 [employer]. This information is not intended to replace advice given to you by your health care provider. Make sure you discuss any questions you have with your health care provider.  

## 2014-03-14 NOTE — Progress Notes (Signed)
Pt assessed, reviewed documentation and agree w/ assessment and plan. Addilyne Backs, MD MPH   

## 2014-03-21 ENCOUNTER — Ambulatory Visit (INDEPENDENT_AMBULATORY_CARE_PROVIDER_SITE_OTHER): Payer: BLUE CROSS/BLUE SHIELD | Admitting: Family Medicine

## 2014-03-21 ENCOUNTER — Encounter: Payer: Self-pay | Admitting: Family Medicine

## 2014-03-21 VITALS — BP 128/87 | HR 102 | Temp 98.2°F | Resp 16 | Ht 71.0 in | Wt 240.0 lb

## 2014-03-21 DIAGNOSIS — F909 Attention-deficit hyperactivity disorder, unspecified type: Secondary | ICD-10-CM

## 2014-03-21 DIAGNOSIS — F411 Generalized anxiety disorder: Secondary | ICD-10-CM

## 2014-03-21 DIAGNOSIS — F988 Other specified behavioral and emotional disorders with onset usually occurring in childhood and adolescence: Secondary | ICD-10-CM

## 2014-03-21 MED ORDER — AMPHETAMINE-DEXTROAMPHET ER 20 MG PO CP24: ORAL_CAPSULE | ORAL | Status: DC

## 2014-03-21 MED ORDER — CLONAZEPAM 1 MG PO TABS: 1.0000 mg | ORAL_TABLET | Freq: Every day | ORAL | Status: DC

## 2014-03-21 NOTE — Patient Instructions (Signed)
We've refilled your klonopin for 4 months. Please take this once nightly 1 mg as needed. We've increased the adderall to 20 mg twice daily. You can try this 20 mg in the am the 20 mg in the pm, or you can try to take the 40 mg at once in the morning.  We'd like to see you back in 3 months.  As we've tried several different adhd meds without a lot of success, it may be a good idea to see a psychiatrist once to get their viewpoint on your add. They may have some new ideas/medications.  There is a new provider with Andee PolesParish McKinney, MD in the area who is seeing patients. [phone] [facility] has psychiatrists and social workers. [phone]

## 2014-03-21 NOTE — Progress Notes (Signed)
Subjective:    Patient ID: Jose Shaffer, male    DOB: 20-Aug-1970, 44 y.o.   MRN: [id number]  PCP: [name], MD  Chief Complaint  Patient presents with  . Medication Refill   Patient Active Problem List   Diagnosis Date Noted  . Insomnia 03/10/2013  . Anxiety state 03/10/2013  . ADD (attention deficit disorder) 09/11/2011   Prior to Admission medications   Medication Sig Start Date End Date Taking? Authorizing Provider  amphetamine-dextroamphetamine (ADDERALL XR) 20 MG 24 hr capsule Take 1 tablet po bid as instructed by physician. May fill prescription today. 03/21/14  Yes Sherren Mocha, MD  clonazePAM (KLONOPIN) 1 MG tablet Take 1 tablet (1 mg total) by mouth at bedtime. 03/21/14 09/14/14 Yes Sherren Mocha, MD  amphetamine-dextroamphetamine (ADDERALL XR) 20 MG 24 hr capsule Take 1 capsule po bid as directed by physician. May fill 30 days from date written 03/21/14   Sherren Mocha, MD  amphetamine-dextroamphetamine (ADDERALL XR) 20 MG 24 hr capsule Take 1 capsule po bid as directed by physician. May fill 60 days from date written. 03/21/14   Sherren Mocha, MD   Medications, allergies, past medical history, surgical history, family history, social history and problem list reviewed and updated.  HPI  71 yom with pmh add, anxiety presents today for med refill.   He was started on adderall xr 20 on 10/30. At that time he menioned he had been on many different add drugs over the years including concerta, vyvanse, etc.  He returned to clinic 12/11 stating that he didn't think the 20 mg dose was working at all. He had taken 40 mg at a time once which he felt really worked. His dose was increased to 30 mg adderall xr daily. He also mentioned that his anxiety was still bothering him and that the klonopin was helpful each evening for this. He mentioned osa sx at that appt and was scheduled for a sleep study.  He returns for f/u today stating that the 30 mg dose was better but that he felt very sleepy by the  afternoon. He felt that the dose helped with his inattention and irritability. He has not had the sleep study yet. He is scheduled to see them in 2 wks. He has again taken the 40 mg dose several times and feels this works well. He continues to take his klonopin around 6pm each evening when the adderall wears off.   He has not seen a psychiatrist previously for his add.     Can take 1 in am and 1 in pm if not lasting long enough. Can stagger dose. Or 40 in am.   See psych you've had all these and not getting great results.   Review of Systems No CP, palps, weight changes, appetite changes, SOB, fever, chills.     Objective:   Physical Exam  Constitutional: He is oriented to person, place, and time. He appears well-developed and well-nourished.  Non-toxic appearance. He does not have a sickly appearance. He does not appear ill. No distress.  BP 128/87 mmHg  Pulse 102  Temp(Src) 98.2 F (36.8 C)  Resp 16  Ht 5\' 11"  (1.803 m)  Wt 240 lb (108.863 kg)  BMI 33.49 kg/m2  SpO2 96%   Neurological: He is alert and oriented to person, place, and time.  Psychiatric: He has a normal mood and affect. His speech is normal.   6/9 on adhd inattentive screening.      Assessment &  Plan:   59 yom with pmh add, anxiety presents today for med refill.   ADD (attention deficit disorder) - Plan: clonazePAM (KLONOPIN) 1 MG tablet --Increased adderall from 30 to 40 mg --rtc 3 months --encouraged him to contact psych as he has not had great success yet with adhd meds here  Anxiety state --klonopin 1 mg po qhs  --hopefully can wean off with bid adderall dosing --rtc 3 months  Donnajean Lopes, PA-C Physician Assistant-Certified [facility]  03/21/2014 4:01 PM

## 2014-03-31 NOTE — Progress Notes (Signed)
Pt assessed, reviewed documentation and agree w/ assessment and plan. Daviona Herbert, MD MPH   

## 2014-04-02 ENCOUNTER — Institutional Professional Consult (permissible substitution): Payer: BC Managed Care – PPO | Admitting: Neurology

## 2014-04-02 ENCOUNTER — Institutional Professional Consult (permissible substitution): Payer: Self-pay | Admitting: Neurology

## 2014-04-07 ENCOUNTER — Telehealth: Payer: Self-pay

## 2014-04-07 NOTE — Telephone Encounter (Signed)
Looks like pt has been tried on a myriad of different regimens lately -    He has been tried on concerta for 15 years so had noticed less effective. States vyvanse and strattera didn't help. He had done well on adderall on the past so was started on 50m which was ineffective - he had noted that several itmes he had taken the 288mtwice and had good effect with that so was increase to 3012m also was not effective enough - was noticing it wearing off so was just put on 20 bid which i guesss is also not effective - or sounds like it is to effective if he feels like a zombie  Pt is scheduled for a sleep study - high suspicion for OSA - so would highly recommend we not muck about with his stimlant therapy to much more until after we see if he needs a cpap and any osa is treated as this should change his energy, mood, and attention significantly.  Also at last visit I recommended pt establish with a psychiatrist since we were having so much trouble getting his ADD controlled with medication.  Perhaps in the meantime, while we are waiting to see if pt needs cpap and he sched an appointment with pysch - he can switch back to the concerta that he was on prior.  Here are 2 psychiastrists in town - but there are a ton he can look up online - pt can schedule with whomever he prefers - needs to be an MD so they can write his medicine and he does not need a referral -    [place]: [id number], [place] [phone] - Dr. ReaDell Pontodress: [phone] [place] [phone]:([phone]: [phone]

## 2014-04-07 NOTE — Telephone Encounter (Signed)
Pt states the adderall is not working well,he states he feels like a Brewing technologistZombie   Best phone for pt is [phone]  Pharmacy [facility] [address]

## 2014-04-07 NOTE — Telephone Encounter (Signed)
Spoke to pt- he has tried to take the Adderall 40mg  all at once in the morning and split it up between 20mg  in the morning and 20mg  in the afternoon. He states he was on 20mg  and did not feel much of an effect.   Advised him to stop taking medication since it is causing the opposite effect.  What can the pt have this changed to? Do you want to see him back in the office first?

## 2014-04-08 MED ORDER — METHYLPHENIDATE HCL ER (OSM) 54 MG PO TBCR: 54.0000 mg | EXTENDED_RELEASE_TABLET | ORAL | Status: DC

## 2014-04-08 NOTE — Telephone Encounter (Signed)
Started to talk to pt and he was not excited about going to psychiatry. Dr Clelia CroftShaw hear me talking to pt and she spoke to pt.

## 2014-04-08 NOTE — Telephone Encounter (Signed)
Will try pt on brand name concerta as worked well initially but then had less effect and more side effects when switched to methylphenidate ER, was not able to tolerate adderall XR or ritalin LA.  Call in 2-3 week to let me know how he is doing and see if he needs refills.

## 2014-04-11 ENCOUNTER — Ambulatory Visit: Payer: Self-pay | Admitting: Family Medicine

## 2014-04-11 ENCOUNTER — Ambulatory Visit: Payer: BC Managed Care – PPO | Admitting: Family Medicine

## 2014-04-14 ENCOUNTER — Ambulatory Visit (INDEPENDENT_AMBULATORY_CARE_PROVIDER_SITE_OTHER): Payer: BLUE CROSS/BLUE SHIELD | Admitting: Neurology

## 2014-04-14 ENCOUNTER — Encounter: Payer: Self-pay | Admitting: Neurology

## 2014-04-14 VITALS — BP 152/92 | HR 92 | Resp 16 | Ht 72.0 in | Wt 244.5 lb

## 2014-04-14 DIAGNOSIS — G4730 Sleep apnea, unspecified: Secondary | ICD-10-CM

## 2014-04-14 DIAGNOSIS — R0683 Snoring: Secondary | ICD-10-CM

## 2014-04-14 NOTE — Patient Instructions (Signed)
Polysomnography (Sleep Studies) Polysomnography (PSG) is a series of tests used for detecting (diagnosing) obstructive sleep apnea and other sleep disorders. The tests measure how some parts of your body are working while you are sleeping. The tests are extensive and expensive. They are done in a sleep lab or hospital, and vary from center to center. Your caregiver may perform other more simple sleep studies and questionnaires before doing more complete and involved testing. Testing may not be covered by insurance. Some of these tests are:  An EEG (Electroencephalogram). This tests your brain waves and stages of sleep.  An EOG (Electrooculogram). This measures the movements of your eyes. It detects periods of REM (rapid eye movement) sleep, which is your dream sleep.  An EKG (Electrocardiogram). This measures your heart rhythm.  EMG (Electromyography). This is a measurement of how the muscles are working in your upper airway and your legs while sleeping.  An oximetry measurement. It measures how much oxygen (air) you are getting while sleeping.  Breathing efforts may be measured. The same test can be interpreted (understood) differently by different caregivers and centers that study sleep.  Studies may be given an apnea/hypopnea index (AHI). This is a number which is found by counting the times of no breathing or under breathing during the night, and relating those numbers to the amount of time spent in bed. When the AHI is greater than 15, the patient is likely to complain of daytime sleepiness. When the AHI is greater than 30, the patient is at increased risk for heart problems and must be followed more closely. Following the AHI also allows you to know how treatment is working. Simple oximetry (tracking the amount of oxygen that is taken in) can be used for screening patients who:  Do not have symptoms (problems) of OSA.  Have a normal Epworth Sleepiness Scale Score.  Have a low pre-test  probability of having OSA.  Have none of the upper airway problems likely to cause apnea.  Oximetry is also used to determine if treatment is effective in patients who showed significant desaturations (not getting enough oxygen) on their home sleep study. One extra measure of safety is to perform additional studies for the person who only snores. This is because no one can predict with absolute certainty who will have OSA. Those who show significant desaturations (not getting enough oxygen) are recommended to have a more detailed sleep study. Document Released: 09/04/2002 Document Revised: 05/23/2011 Document Reviewed: 05/06/2013 ExitCare Patient Information 2015 [employer]. This information is not intended to replace advice given to you by your health care provider. Make sure you discuss any questions you have with your health care provider.  

## 2014-04-14 NOTE — Progress Notes (Addendum)
SLEEP MEDICINE CLINIC   Provider:  Melvyn Novasarmen  Palmina Clodfelter, M D  Referring Provider: Sherren MochaShaw, Eva N, MD Primary Care Physician:  Norberto SorensonSHAW,EVA, MD  Chief Complaint  Patient presents with  . NP Sleep Consult Shaw    Rm 10, alone    HPI:  Jose Shaffer is a 44 y.o. male , seen here as a referral from Dr. Clelia CroftShaw for a sleep consultation,  The patient's wife has noted an increase in snoring over the last 4 years and his wife has witnessed apnoeic breathing . The patient falls asleep on his side but wakes- often choking - on his back.  He has lifelong struggled with sinus congestion and nasal congestion, and became a habitual  mouth breather. .  He described his bedroom, as cool quiet and dark, shares it with his wife.  He goes to bed at 10 PM and will be asleep promptly,as he  takes Klonopin. He will have 2 bathroom breaks. He will wake up spontaneously a couple of times, turning to the side after his wife notches him.  He wakes up unrefreshed at about 6 AM , needs an alarm , he is often stiff, achy, his  joints hurt, has a  dry mouth and throat, he is tired.  He takes Concerta in AM , rarely drinks caffeine. He works in daytime and  in daylight. His Concerta wears off about 2 PM. 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 He takes Klonopin at about 4 PM, Prescribed by Dr  Norberto SorensonEva Shaw.   He has borderline BP, and No thyroid disease, or diabetes. His weight is stable. He has been able to control his nasal congestion  somewhat with Afrin, still his left nasion is congested.  He has exercise induced asthma, but experiences a paradoxical second wind after 15-20 minutes of exercising.  No family history of a sleep disorder is known .   Review of Systems: Out of a complete 14 system review, the patient complains  of only the following symptoms, and all other reviewed systems are negative. Epworth score 9, Fatigue severity score 43   , depression score 1 . Snoring, anxiety, fatigue,  Joint pain stuffy nose.    History   Social History  . Marital Status: Single    Spouse Name: Tristan SchroederLien    Number of Children: 0  . Years of Education: 12+   Occupational History  . truck driver   . contractor    Social History Main Topics  . Smoking status: Never Smoker   . Smokeless tobacco: Never Used  . Alcohol Use: No  . Drug Use: No  . Sexual Activity:    Partners: Female    CopyBirth Control/ Protection: None   Other Topics Concern  . Not on file   Social History Narrative   Lives with wife, Tristan SchroederLien   Caffeine 1 cup avg twice weekly    Family History  Problem Relation Age of Onset  . Hypertension Father   . Cancer Maternal Grandmother 2073    breast cancer  . Hypertension Paternal Grandmother   . COPD Paternal Grandfather     lung cancer; tobacco use, Doctor, general practicesteel mill worker  . Cancer Maternal Aunt     breast cancer, late 7550's  . Cancer  Maternal Aunt     breast cancer; diagnosed late 32's    Past Medical History  Diagnosis Date  . Allergy   . ADD (attention deficit disorder)   . Insomnia   . Anxiety state, unspecified 03/10/2013    Past Surgical History  Procedure Laterality Date  . Quadriceps tendon repair  08/2008    Current Outpatient Prescriptions  Medication Sig Dispense Refill  . clonazePAM (KLONOPIN) 1 MG tablet Take 1 tablet (1 mg total) by mouth at bedtime. 30 tablet 3  . methylphenidate (CONCERTA) 54 MG PO CR tablet Take 1 tablet (54 mg total) by mouth every morning. Brand name concerta medically necessary. 30 tablet 0   No current facility-administered medications for this visit.    Allergies as of 04/14/2014 - Review Complete 04/14/2014  Allergen Reaction Noted  . Ibuprofen Other (See Comments) 04/08/2010    Vitals: BP 152/92 mmHg  Pulse 92  Resp 16  Ht 6' (1.829 m)  Wt  244 lb 8 oz (110.904 kg)  BMI 33.15 kg/m2 Last Weight:  Wt Readings from Last 1 Encounters:  04/14/14 244 lb 8 oz (110.904 kg)       Last Height:   Ht Readings from Last 1 Encounters:  04/14/14 6' (1.829 m)    Physical exam:  General: The patient is awake, alert and appears not in acute distress. The patient is well groomed. Head: Normocephalic, atraumatic. Neck is supple. Mallampati 4  neck circumference: 18,  Nasal airflow restricted , TMJ is evident . Retrognathia is not seen.  Cardiovascular:  Regular rate and rhythm , without  murmurs or carotid bruit, and without distended neck veins. Respiratory: Lungs are clear to auscultation. Skin:  Without evidence of edema, or rash Trunk: BMI is elevated and patient  has normal posture.  Neurologic exam : The patient is awake and alert, oriented to place and time.   Memory subjective described as intact.  There is a normal attention span & concentration ability.  Speech is fluent without  dysarthria, dysphonia or aphasia. Mood and affect are appropriate.  Cranial nerves: Pupils are equal and briskly reactive to light. Funduscopic exam without  evidence of pallor or edema. Extraocular movements  in vertical and horizontal planes intact and without nystagmus. Visual fields by finger perimetry are intact. Hearing to finger rub intact.  Facial sensation intact to fine touch.  Facial motor strength is symmetric and tongue and uvula move midline.  Motor exam:   Normal tone, muscle bulk and symmetric ,strength in all extremities.  Sensory:  Fine touch,  vibration were tested in all extremities. Proprioception is normal.  Coordination: Rapid alternating movements / Finger-to-nose maneuver without evidence of ataxia, dysmetria or tremor.  Gait and station: Patient walks without assistive device and is able unassisted to climb up to the exam table.  Strength within normal limits. Stance is stable and normal. Tandem gait is unfragmented.  Deep  tendon reflexes: in the  upper and lower extremities are symmetric and intact. Babinski maneuver response is downgoing.   Assessment:  After physical and neurologic examination, review of laboratory studies, imaging, neurophysiology testing and pre-existing records, assessment is :  1) snoring ,witnessed apneas , and high Mallompati. suspect OSA 2) patient with excessive daytime fatigue on long time therapy with Ritalin  3) exercise induced asthma.    The patient was advised of the nature of the diagnosed sleep disorder , the treatment options and risks for general a health and wellness arising from not treating the condition.  Visit duration was 30 minutes.   1) SPLIT night polysomnography. No Co2 needed, but split at [facility] 15 ands score at 3%.           Porfirio Mylar Tacha Manni MD  04/14/2014

## 2014-04-15 DIAGNOSIS — G4730 Sleep apnea, unspecified: Secondary | ICD-10-CM | POA: Insufficient documentation

## 2014-04-15 DIAGNOSIS — R0683 Snoring: Secondary | ICD-10-CM | POA: Insufficient documentation

## 2014-05-02 ENCOUNTER — Other Ambulatory Visit: Payer: Self-pay

## 2014-05-02 NOTE — Telephone Encounter (Signed)
Pt of Dr. Clelia CroftShaw requesting refill on  methylphenidate (CONCERTA) 54 MG PO CR tablet [[id number]][[id number]]  ,

## 2014-05-07 ENCOUNTER — Ambulatory Visit (INDEPENDENT_AMBULATORY_CARE_PROVIDER_SITE_OTHER): Payer: BLUE CROSS/BLUE SHIELD | Admitting: Neurology

## 2014-05-07 DIAGNOSIS — G4730 Sleep apnea, unspecified: Secondary | ICD-10-CM | POA: Diagnosis not present

## 2014-05-07 DIAGNOSIS — R0683 Snoring: Secondary | ICD-10-CM

## 2014-05-07 NOTE — Sleep Study (Signed)
Please see the scanned sleep study interpretation located in the Procedure tab within the Chart Review section.Please see the scanned sleep study interpretation located in the Procedure tab within the Chart Review section. 

## 2014-05-08 MED ORDER — METHYLPHENIDATE HCL ER (OSM) 54 MG PO TBCR: 54.0000 mg | EXTENDED_RELEASE_TABLET | ORAL | Status: DC

## 2014-05-08 NOTE — Telephone Encounter (Signed)
Patient called again about his Concerta refill. Original message was left on 05/02/2014.

## 2014-05-08 NOTE — Telephone Encounter (Signed)
concerta refilled x 3 months and ready for pt to pick up.  Please have pt schedule an OV at 104 - needs OV for any additional refills.

## 2014-05-20 ENCOUNTER — Encounter: Payer: Self-pay | Admitting: Neurology

## 2014-05-22 ENCOUNTER — Telehealth: Payer: Self-pay | Admitting: *Deleted

## 2014-05-22 ENCOUNTER — Encounter: Payer: Self-pay | Admitting: Neurology

## 2014-05-22 ENCOUNTER — Encounter: Payer: Self-pay | Admitting: *Deleted

## 2014-05-22 ENCOUNTER — Other Ambulatory Visit: Payer: Self-pay | Admitting: Neurology

## 2014-05-22 DIAGNOSIS — G4733 Obstructive sleep apnea (adult) (pediatric): Secondary | ICD-10-CM

## 2014-05-22 NOTE — Telephone Encounter (Signed)
Patient was contacted and provided the results of his split night sleep study that did reveal the presence of sleep apnea.  Patient was instructed that treatment with CPAP therapy had been recommended by the Dr. Vickey Hugerohmeier.  The patient was in agreement and was [facility].  The patient gave verbal permission to mail a copy of his test results.   Patient instructed to contact our office 6-8 weeks post set up to schedule a follow up appointment.  Dr. Norberto SorensonEva Shaw was routed a copy of the report.

## 2014-06-20 NOTE — Telephone Encounter (Signed)
Pt hadn't p/up Rxs so I called him and he advised that no one had called him when they were ready. I see that it looks like he was correct, nothing is documented that he was called. Pt  Reported that he underwent sleep study and now has CPAP. He was hoping that this would help his ADD Sxs. Stated that it has helped him tremendously in areas of sleep and feeling rested, but he still has issues w/ADD. Pt would like to come and get Rxs, but also transferred him to Scheduling to set up appt. Rxs put back in drawer under current date.

## 2014-06-21 ENCOUNTER — Other Ambulatory Visit: Payer: Self-pay | Admitting: Family Medicine

## 2014-06-27 NOTE — Telephone Encounter (Signed)
Faxed

## 2014-07-04 ENCOUNTER — Encounter: Payer: Self-pay | Admitting: Neurology

## 2014-07-11 ENCOUNTER — Ambulatory Visit (INDEPENDENT_AMBULATORY_CARE_PROVIDER_SITE_OTHER): Payer: BLUE CROSS/BLUE SHIELD | Admitting: Family Medicine

## 2014-07-11 ENCOUNTER — Encounter: Payer: Self-pay | Admitting: Family Medicine

## 2014-07-11 VITALS — BP 130/88 | HR 87 | Temp 98.0°F | Resp 16 | Ht 70.75 in | Wt 238.2 lb

## 2014-07-11 DIAGNOSIS — G4730 Sleep apnea, unspecified: Secondary | ICD-10-CM | POA: Diagnosis not present

## 2014-07-11 DIAGNOSIS — F988 Other specified behavioral and emotional disorders with onset usually occurring in childhood and adolescence: Secondary | ICD-10-CM

## 2014-07-11 DIAGNOSIS — F411 Generalized anxiety disorder: Secondary | ICD-10-CM | POA: Diagnosis not present

## 2014-07-11 DIAGNOSIS — F909 Attention-deficit hyperactivity disorder, unspecified type: Secondary | ICD-10-CM

## 2014-07-11 DIAGNOSIS — G4700 Insomnia, unspecified: Secondary | ICD-10-CM | POA: Diagnosis not present

## 2014-07-11 MED ORDER — METHYLPHENIDATE HCL ER (OSM) 54 MG PO TBCR: 54.0000 mg | EXTENDED_RELEASE_TABLET | ORAL | Status: DC

## 2014-07-11 MED ORDER — CLONAZEPAM 1 MG PO TABS: 1.0000 mg | ORAL_TABLET | Freq: Every day | ORAL | Status: DC

## 2014-07-11 NOTE — Progress Notes (Signed)
Subjective:  This chart was scribed for Norberto SorensonEva Shaw, MD by [facility] LLCNadim Abu Hashem, medical scribe at [facility].The patient was seen in exam room 22 and the patient's care was started at 5:36 PM.   Patient ID: Jose Allegrahristopher Shaffer, male    DOB: [birth year], 44 y.o.   MRN: [id number] Chief Complaint  Patient presents with  . Follow-up    CPAP  . Medication Refill    clonazepam and concerta   HPI HPI Comments: Jose AllegraChristopher Solum is a 44 y.o. male who presents to [facility] follow up. Pt has gotten his CPAP, sleeping has improved since using the CPAP, he is waking up refreshed.  Pt also needs a medication refill on klonopin and concerta. Name brand concerta works better but lasts longer and prevents him to fall asleep so he takes 2 mg of klonopin. There was a mix up in his klonopin medication and he got a 0.5 mg instead of 1 mg sent to his pharmacy.   Pt also complains of pain in the joints of his fingers and the heels of his feet. Onset of the pain typically occurs at the end of the day after work.  Past Medical History  Diagnosis Date  . Allergy   . ADD (attention deficit disorder)   . Insomnia   . Anxiety state, unspecified 03/10/2013   Current Outpatient Prescriptions on File Prior to Visit  Medication Sig Dispense Refill  . clonazePAM (KLONOPIN) 1 MG tablet Take 1 tablet (1 mg total) by mouth at bedtime. 30 tablet 3  . methylphenidate (CONCERTA) 54 MG PO CR tablet Take 1 tablet (54 mg total) by mouth every morning. Brand name concerta medically necessary. 30 tablet 0  . methylphenidate (CONCERTA) 54 MG PO CR tablet Take 1 tablet (54 mg total) by mouth every morning. BRAND NAME MEDICALLY NECESSARY 30 tablet 0  . methylphenidate (CONCERTA) 54 MG PO CR tablet Take 1 tablet (54 mg total) by mouth every morning. BRAND NAME MEDICALLY NECESSARY 30 tablet 0  . clonazePAM (KLONOPIN) 0.5 MG tablet TAKE 1 TABLET BY MOUTH TWICE DAILY AS NEEDED FOR ANXIETY (Patient  not taking: Reported on 07/11/2014) 45 tablet 1   No current facility-administered medications on file prior to visit.   Allergies  Allergen Reactions  . Ibuprofen Other (See Comments)    Per patient feels like KNOT in throat and itching with rash   Review of Systems  Musculoskeletal: Positive for arthralgias.      Objective:  BP 130/88 mmHg  Pulse 87  Temp(Src) 98 F (36.7 C) (Oral)  Resp 16  Ht 5' 10.75" (1.797 m)  Wt 238 lb 3.2 oz (108.047 kg)  BMI 33.46 kg/m2  SpO2 96% Physical Exam  Constitutional: He is oriented to person, place, and time. He appears well-developed and well-nourished. No distress.  HENT:  Head: Normocephalic and atraumatic.  Eyes: Pupils are equal, round, and reactive to light.  Neck: Normal range of motion.  Cardiovascular: Normal rate and regular rhythm.   Pulmonary/Chest: Effort normal. No respiratory distress.  Musculoskeletal: Normal range of motion.  Neurological: He is alert and oriented to person, place, and time.  Skin: Skin is warm and dry.  Psychiatric: He has a normal mood and affect. His behavior is normal.  Nursing note and vitals reviewed.     Assessment & Plan:   1. ADD (attention deficit disorder)   2. Anxiety state   3. Insomnia   4. Sleep apnea  Meds ordered this encounter  Medications  . clonazePAM (KLONOPIN) 1 MG tablet    Sig: Take 1-2 tablets (1-2 mg total) by mouth at bedtime.    Dispense:  60 tablet    Refill:  2  . methylphenidate (CONCERTA) 54 MG PO CR tablet    Sig: Take 1 tablet (54 mg total) by mouth every morning. Brand name concerta medically necessary.    Dispense:  30 tablet    Refill:  0    Brand name medially necessary.  May fill on or after 30d after date written.  . methylphenidate (CONCERTA) 54 MG PO CR tablet    Sig: Take 1 tablet (54 mg total) by mouth every morning. BRAND NAME MEDICALLY NECESSARY    Dispense:  30 tablet    Refill:  0    BRAND NAME MEDICALLY NECESSARY.  May fill on or  after 60d after date written.  . methylphenidate (CONCERTA) 54 MG PO CR tablet    Sig: Take 1 tablet (54 mg total) by mouth every morning. BRAND NAME MEDICALLY NECESSARY    Dispense:  30 tablet    Refill:  0    BRAND NAME MEDICALLY NECESSARY    I personally performed the services described in this documentation, which was scribed in my presence. The recorded information has been reviewed and considered, and addended by me as needed.  [name], MD MPH

## 2014-07-29 ENCOUNTER — Ambulatory Visit: Payer: BLUE CROSS/BLUE SHIELD | Admitting: Neurology

## 2014-10-15 ENCOUNTER — Encounter (HOSPITAL_COMMUNITY): Payer: Self-pay | Admitting: Emergency Medicine

## 2014-10-15 ENCOUNTER — Emergency Department (HOSPITAL_COMMUNITY)
Admission: EM | Admit: 2014-10-15 | Discharge: 2014-10-15 | Disposition: A | Discharge status: Home / Self Care | Payer: BLUE CROSS/BLUE SHIELD | Attending: Physician Assistant | Admitting: Physician Assistant

## 2014-10-15 ENCOUNTER — Emergency Department (HOSPITAL_COMMUNITY): Payer: BLUE CROSS/BLUE SHIELD

## 2014-10-15 DIAGNOSIS — F419 Anxiety disorder, unspecified: Secondary | ICD-10-CM | POA: Insufficient documentation

## 2014-10-15 DIAGNOSIS — K4020 Bilateral inguinal hernia, without obstruction or gangrene, not specified as recurrent: Secondary | ICD-10-CM

## 2014-10-15 DIAGNOSIS — K5732 Diverticulitis of large intestine without perforation or abscess without bleeding: Secondary | ICD-10-CM | POA: Diagnosis not present

## 2014-10-15 DIAGNOSIS — Z8679 Personal history of other diseases of the circulatory system: Secondary | ICD-10-CM | POA: Insufficient documentation

## 2014-10-15 DIAGNOSIS — R109 Unspecified abdominal pain: Secondary | ICD-10-CM

## 2014-10-15 DIAGNOSIS — K429 Umbilical hernia without obstruction or gangrene: Secondary | ICD-10-CM

## 2014-10-15 DIAGNOSIS — R1030 Lower abdominal pain, unspecified: Secondary | ICD-10-CM | POA: Diagnosis present

## 2014-10-15 LAB — CBC WITH DIFFERENTIAL/PLATELET
BASOS ABS: 0 10*3/uL (ref 0.0–0.1)
BASOS PCT: 0 % (ref 0–1)
EOS PCT: 1 % (ref 0–5)
Eosinophils Absolute: 0.1 10*3/uL (ref 0.0–0.7)
HEMATOCRIT: 53.6 % — AB (ref 39.0–52.0)
HEMOGLOBIN: 18.5 g/dL — AB (ref 13.0–17.0)
LYMPHS PCT: 7 % — AB (ref 12–46)
Lymphs Abs: 1.3 10*3/uL (ref 0.7–4.0)
MCH: 29.3 pg (ref 26.0–34.0)
MCHC: 34.5 g/dL (ref 30.0–36.0)
MCV: 84.9 fL (ref 78.0–100.0)
Monocytes Absolute: 1.9 10*3/uL — ABNORMAL HIGH (ref 0.1–1.0)
Monocytes Relative: 10 % (ref 3–12)
NEUTROS ABS: 15 10*3/uL — AB (ref 1.7–7.7)
NEUTROS PCT: 82 % — AB (ref 43–77)
Platelets: 286 10*3/uL (ref 150–400)
RBC: 6.31 MIL/uL — AB (ref 4.22–5.81)
RDW: 14.5 % (ref 11.5–15.5)
WBC: 18.4 10*3/uL — AB (ref 4.0–10.5)

## 2014-10-15 LAB — URINALYSIS, ROUTINE W REFLEX MICROSCOPIC
BILIRUBIN URINE: NEGATIVE
GLUCOSE, UA: 500 mg/dL — AB
HGB URINE DIPSTICK: NEGATIVE
Ketones, ur: NEGATIVE mg/dL
Leukocytes, UA: NEGATIVE
Nitrite: NEGATIVE
PH: 6.5 (ref 5.0–8.0)
Protein, ur: NEGATIVE mg/dL
Specific Gravity, Urine: 1.019 (ref 1.005–1.030)
Urobilinogen, UA: 0.2 mg/dL (ref 0.0–1.0)

## 2014-10-15 LAB — BASIC METABOLIC PANEL
Anion gap: 8 (ref 5–15)
BUN: 12 mg/dL (ref 6–20)
CALCIUM: 8.8 mg/dL — AB (ref 8.9–10.3)
CO2: 24 mmol/L (ref 22–32)
Chloride: 102 mmol/L (ref 101–111)
Creatinine, Ser: 1.2 mg/dL (ref 0.61–1.24)
GFR calc Af Amer: >60 mL/min (ref >60)
Glucose, Bld: 115 mg/dL — ABNORMAL HIGH (ref 65–99)
POTASSIUM: 4.2 mmol/L (ref 3.5–5.1)
Sodium: 134 mmol/L — ABNORMAL LOW (ref 135–145)

## 2014-10-15 LAB — POC OCCULT BLOOD, ED: Fecal Occult Bld: NEGATIVE

## 2014-10-15 MED ORDER — METRONIDAZOLE 500 MG PO TABS
500.0000 mg | ORAL_TABLET | Freq: Once | ORAL | Status: AC
  Administered 2014-10-15: 500 mg via ORAL
  Filled 2014-10-15: qty 1

## 2014-10-15 MED ORDER — CIPROFLOXACIN HCL 500 MG PO TABS: 500.0000 mg | ORAL_TABLET | Freq: Two times a day (BID) | ORAL | Status: DC

## 2014-10-15 MED ORDER — METRONIDAZOLE 500 MG PO TABS: 500.0000 mg | ORAL_TABLET | Freq: Three times a day (TID) | ORAL | Status: DC

## 2014-10-15 MED ORDER — ONDANSETRON HCL 4 MG/2ML IJ SOLN
4.0000 mg | Freq: Once | INTRAMUSCULAR | Status: AC
  Administered 2014-10-15: 4 mg via INTRAVENOUS
  Filled 2014-10-15: qty 2

## 2014-10-15 MED ORDER — MORPHINE SULFATE 4 MG/ML IJ SOLN
4.0000 mg | Freq: Once | INTRAMUSCULAR | Status: AC
  Administered 2014-10-15: 4 mg via INTRAVENOUS
  Filled 2014-10-15: qty 1

## 2014-10-15 MED ORDER — SODIUM CHLORIDE 0.9 % IV BOLUS (SEPSIS)
1000.0000 mL | Freq: Once | INTRAVENOUS | Status: AC
  Administered 2014-10-15: 1000 mL via INTRAVENOUS

## 2014-10-15 MED ORDER — CIPROFLOXACIN HCL 500 MG PO TABS
500.0000 mg | ORAL_TABLET | Freq: Once | ORAL | Status: AC
  Administered 2014-10-15: 500 mg via ORAL
  Filled 2014-10-15: qty 1

## 2014-10-15 MED ORDER — IOHEXOL 300 MG/ML  SOLN
50.0000 mL | Freq: Once | INTRAMUSCULAR | Status: AC | PRN
  Administered 2014-10-15: 50 mL via ORAL

## 2014-10-15 MED ORDER — HYDROCODONE-ACETAMINOPHEN 5-325 MG PO TABS: 1.0000 | ORAL_TABLET | Freq: Four times a day (QID) | ORAL | Status: DC | PRN

## 2014-10-15 MED ORDER — IOHEXOL 300 MG/ML  SOLN
100.0000 mL | Freq: Once | INTRAMUSCULAR | Status: AC | PRN
  Administered 2014-10-15: 100 mL via INTRAVENOUS

## 2014-10-15 NOTE — ED Provider Notes (Signed)
CSN: [id number]     Arrival date & time 10/15/14  0726 History   First MD Initiated Contact with Patient 10/15/14 [phone]     Chief Complaint  Patient presents with  . Abdominal Pain     (Consider location/radiation/quality/duration/timing/severity/associated sxs/prior Treatment) The history is provided by the patient and medical records. No language interpreter was used.     Sire Poet is a 44 y.o. male  with a hx of ADD, anxiety, insomnia presents to the Emergency Department complaining of gradual, persistent, progressively worsening lower abdominal pain onset 2 days ago but worsening this morning. Patient reports that he has a hernia in the umbilical region that sometimes gives him pain but today symptoms are much lower and different from previous. He reports that after eating and drinking milk he often has bloating and abdominal pain however he continues to drink approximately half gallon of milk per day. He reports some nausea today but no vomiting or diarrhea. He denies hematemesis, melanotic or hematochezia.  Patient has never been assessed by a surgeon for his hernia. He denies abdominal surgeries. He does report that he lifted a heavy filing cabinet on 10/13/2014 and that his pain began after that.  Nothing seems to make his symptoms better or worse. He denies fever, chills, headache, neck pain, chest pain, shortness of breath, weakness, dizziness, syncope, dysuria, flank pain, hematuria.   Past Medical History  Diagnosis Date  . Allergy   . ADD (attention deficit disorder)   . Insomnia   . Anxiety state, unspecified 03/10/2013   Past Surgical History  Procedure Laterality Date  . Quadriceps tendon repair  08/2008   Family History  Problem Relation Age of Onset  . Hypertension Father   . Cancer Maternal Grandmother 27    breast cancer  . Hypertension Paternal Grandmother   . COPD Paternal Grandfather     lung cancer; tobacco use, Doctor, general practice  . Cancer Maternal  Aunt     breast cancer, late 75's  . Cancer Maternal Aunt     breast cancer; diagnosed late 50's   History  Substance Use Topics  . Smoking status: Never Smoker   . Smokeless tobacco: Never Used  . Alcohol Use: No    Review of Systems  Constitutional: Negative for fever, diaphoresis, appetite change, fatigue and unexpected weight change.  HENT: Negative for mouth sores.   Eyes: Negative for visual disturbance.  Respiratory: Negative for cough, chest tightness, shortness of breath and wheezing.   Cardiovascular: Negative for chest pain.  Gastrointestinal: Positive for abdominal pain (fullness and discomfort). Negative for nausea, vomiting, diarrhea and constipation.  Endocrine: Negative for polydipsia, polyphagia and polyuria.  Genitourinary: Negative for dysuria, urgency, frequency and hematuria.  Musculoskeletal: Negative for back pain and neck stiffness.  Skin: Negative for rash.  Allergic/Immunologic: Negative for immunocompromised state.  Neurological: Negative for syncope, light-headedness and headaches.  Hematological: Does not bruise/bleed easily.  Psychiatric/Behavioral: Negative for sleep disturbance. The patient is not nervous/anxious.       Allergies  Ibuprofen  Home Medications   Prior to Admission medications   Medication Sig Start Date End Date Taking? Authorizing Provider  amphetamine-dextroamphetamine (ADDERALL XR) 10 MG 24 hr capsule Take 10 mg by mouth daily as needed (for adhd).   Yes Historical Provider, MD  clonazePAM (KLONOPIN) 1 MG tablet Take 1-2 tablets (1-2 mg total) by mouth at bedtime. Patient taking differently: Take 1-2 mg by mouth every evening.  07/11/14 01/04/15 Yes Sherren Mocha, MD  methylphenidate (  CONCERTA) 54 MG PO CR tablet Take 1 tablet (54 mg total) by mouth every morning. Brand name concerta medically necessary. Patient taking differently: Take 54 mg by mouth daily. Brand name concerta medically necessary. 07/11/14  Yes Sherren Mocha, MD   ciprofloxacin (CIPRO) 500 MG tablet Take 1 tablet (500 mg total) by mouth 2 (two) times daily. 10/15/14   Ilaria Much, PA-C  HYDROcodone-acetaminophen (NORCO/VICODIN) 5-325 MG per tablet Take 1-2 tablets by mouth every 6 (six) hours as needed for moderate pain or severe pain. 10/15/14   Jhalen Eley, PA-C  methylphenidate (CONCERTA) 54 MG PO CR tablet Take 1 tablet (54 mg total) by mouth every morning. BRAND NAME MEDICALLY NECESSARY Patient not taking: Reported on 10/15/2014 07/11/14   Sherren Mocha, MD  methylphenidate (CONCERTA) 54 MG PO CR tablet Take 1 tablet (54 mg total) by mouth every morning. BRAND NAME MEDICALLY NECESSARY Patient not taking: Reported on 10/15/2014 07/11/14   Sherren Mocha, MD  metroNIDAZOLE (FLAGYL) 500 MG tablet Take 1 tablet (500 mg total) by mouth 3 (three) times daily. 10/15/14   Elhadji Pecore, PA-C   BP 138/74 mmHg  Pulse 87  Temp(Src) 98.2 F (36.8 C) (Oral)  Resp 18  SpO2 98% Physical Exam  Constitutional: He appears well-developed and well-nourished.  HENT:  Head: Normocephalic and atraumatic.  Mouth/Throat: Oropharynx is clear and moist.  Eyes: Conjunctivae are normal. No scleral icterus.  Cardiovascular: Normal rate, regular rhythm, normal heart sounds and intact distal pulses.   No murmur heard. Pulmonary/Chest: Effort normal and breath sounds normal.  Abdominal: Soft. Bowel sounds are normal. He exhibits no distension and no mass. There is tenderness in the right lower quadrant, suprapubic area and left lower quadrant. There is no rebound, no guarding and no CVA tenderness. A hernia is present. Hernia confirmed positive in the right inguinal area and confirmed positive in the left inguinal area.  Abd soft with lower abd tenderness throughout Palpable umbilical hernia which is completely reducible and uncomfortable but not tense or painful to palpate  Genitourinary: Testes normal and penis normal.  non tender and non strangulated bilateral inguinal  hernias; both are completely reducible  Lymphadenopathy:       Right: No inguinal adenopathy present.       Left: No inguinal adenopathy present.  Neurological: He is alert.  Skin: Skin is warm and dry.  Psychiatric: He has a normal mood and affect.  Nursing note and vitals reviewed.   ED Course  Procedures (including critical care time) Labs Review Labs Reviewed  CBC WITH DIFFERENTIAL/PLATELET - Abnormal; Notable for the following:    WBC 18.4 (*)    RBC 6.31 (*)    Hemoglobin 18.5 (*)    HCT 53.6 (*)    Neutrophils Relative % 82 (*)    Neutro Abs 15.0 (*)    Lymphocytes Relative 7 (*)    Monocytes Absolute 1.9 (*)    All other components within normal limits  BASIC METABOLIC PANEL - Abnormal; Notable for the following:    Sodium 134 (*)    Glucose, Bld 115 (*)    Calcium 8.8 (*)    All other components within normal limits  URINALYSIS, ROUTINE W REFLEX MICROSCOPIC (NOT AT [facility]) - Abnormal; Notable for the following:    Glucose, UA 500 (*)    All other components within normal limits  POC OCCULT BLOOD, ED    Imaging Review Ct Abdomen Pelvis W Contrast  10/15/2014   CLINICAL DATA:  Abdominal  pain  EXAM: CT ABDOMEN AND PELVIS WITH CONTRAST  TECHNIQUE: Multidetector CT imaging of the abdomen and pelvis was performed using the standard protocol following bolus administration of intravenous contrast.  CONTRAST:  OMNIPAQUE IOHEXOL 300 MG/ML  SOLN  COMPARISON:  01/13/2007  FINDINGS: Lung bases are unremarkable. Sagittal images of the spine shows mild degenerative changes thoracolumbar spine. Disc space flattening at L5-S1 level. Enhanced liver is unremarkable. No calcified gallstones are noted within gallbladder. Abdominal aorta is unremarkable. Enhanced pancreas, spleen and adrenal glands are unremarkable. Tiny accessory splenule is noted.  Kidneys are symmetrical in size and enhancement. No hydronephrosis or hydroureter.  There is a left paraumbilical hernia containing fat and  small amount of fluid measures 2.6 x 1.5 cm. There is partially anterior wall of a small bowel loop entering the hernia without evidence of acute complication. See axial image 58.  Normal appendix. No pericecal inflammation. Moderate stool noted in right colon and transverse colon. Colonic diverticula are noted descending colon. Multiple sigmoid colon diverticula are noted. In axial image 70 there is inflammatory process in left pelvis with segmental thickening of colonic wall and stranding of pericolonic fat. Findings are consistent with sigmoid colon segmental colitis or acute diverticulitis. No pericolonic abscess is noted. Mild stranding of pericolonic fat just above the urinary bladder.  There is no definite evidence of perforation. No thickening of urinary bladder wall. Small bilateral inguinal canal hernia containing fat without evidence of acute complication. Prostate gland and seminal vesicles are unremarkable.  Sagittal images of the spine shows Schmorl's defect upper endplate of L4 vertebral body.  Delayed renal images shows bilateral renal symmetrical excretion. Bilateral visualized proximal ureter is unremarkable.  IMPRESSION: 1. There is inflammatory process in left pelvis just above the urinary bladder with segmental thickening of sigmoid colon wall and stranding of pericolonic fat. Best seen in coronal image 51. Findings are consistent with segmental colitis or acute diverticulitis. No definite evidence of perforation. 2. There is a left paraumbilical hernia containing fat, small amount air-fluid and partial anterior wall of a small bowel loop without evidence of small bowel obstruction or complication. 3. No hydronephrosis or hydroureter. 4. Normal appendix.  No pericecal inflammation.   Electronically Signed   By: Natasha Mead M.D.   On: 10/15/2014 09:56     EKG Interpretation None      MDM   Final diagnoses:  Abdominal pain  Diverticulitis of large intestine without perforation or  abscess without bleeding  Umbilical hernia without obstruction and without gangrene  Bilateral inguinal hernia without obstruction or gangrene, recurrence not specified   Kamarion Zagami presents with lower abdominal pain and complains of fullness of the last 2 days. Patient has umbilical and bilateral inguinal hernias on exam. They're all soft and nontender. Downstream dilation however with the symptoms will obtain CT scan to rule out incarceration. Patient tolerating by mouth prior to arrival. Will give pain control and reassess.  11:54 AM Patient's abdomen is soft and nontender this point. CT scan with diverticulitis. Confirms umbilical and bilateral inguinal hernias which are not strangulated at this time. Patient given Cipro and Flagyl here in the emergency department and will be discharged home with same. He is tolerating by mouth without difficulty. No other signs of infection. No tachycardia, fever or hypertension. No evidence of sepsis. Should return precautions given. Patient given follow-up with GI for his diverticulitis and further discussion of potential colonoscopy. Patient also given follow-up with [facility] for discussion of repair of his  multiple hernias.  BP 138/74 mmHg  Pulse 87  Temp(Src) 98.2 F (36.8 C) (Oral)  Resp 18  SpO2 98%  The patient was discussed with and seen by Dr. Corlis Leak who agrees with the treatment plan.   Dahlia Client Skila Rollins, PA-C 10/15/14 1154  Courteney Randall An, MD 10/20/14 [phone]

## 2014-10-15 NOTE — Discharge Instructions (Signed)
1. Medications: Cipro, Flagyl, vicodin, usual home medications 2. Treatment: rest, drink plenty of fluids, advance diet slowly 3. Follow Up: Please followup with your primary doctor in 2 days for discussion of your diagnoses and further evaluation after today's visit; if you do not have a primary care doctor use the resource guide provided to find one; Please return to the ER for persistent vomiting, high fevers or worsening symptoms    Diverticulitis Diverticulitis is inflammation or infection of small pouches in your colon that form when you have a condition called diverticulosis. The pouches in your colon are called diverticula. Your colon, or large intestine, is where water is absorbed and stool is formed. Complications of diverticulitis can include:  Bleeding.  Severe infection.  Severe pain.  Perforation of your colon.  Obstruction of your colon. CAUSES  Diverticulitis is caused by bacteria. Diverticulitis happens when stool becomes trapped in diverticula. This allows bacteria to grow in the diverticula, which can lead to inflammation and infection. RISK FACTORS People with diverticulosis are at risk for diverticulitis. Eating a diet that does not include enough fiber from fruits and vegetables may make diverticulitis more likely to develop. SYMPTOMS  Symptoms of diverticulitis may include:  Abdominal pain and tenderness. The pain is normally located on the left side of the abdomen, but may occur in other areas.  Fever and chills.  Bloating.  Cramping.  Nausea.  Vomiting.  Constipation.  Diarrhea.  Blood in your stool. DIAGNOSIS  Your health care provider will ask you about your medical history and do a physical exam. You may need to have tests done because many medical conditions can cause the same symptoms as diverticulitis. Tests may include:  Blood tests.  Urine tests.  Imaging tests of the abdomen, including X-rays and CT scans. When your condition is  under control, your health care provider may recommend that you have a colonoscopy. A colonoscopy can show how severe your diverticula are and whether something else is causing your symptoms. TREATMENT  Most cases of diverticulitis are mild and can be treated at home. Treatment may include:  Taking over-the-counter pain medicines.  Following a clear liquid diet.  Taking antibiotic medicines by mouth for 7-10 days. More severe cases may be treated at a hospital. Treatment may include:  Not eating or drinking.  Taking prescription pain medicine.  Receiving antibiotic medicines through an IV tube.  Receiving fluids and nutrition through an IV tube.  Surgery. HOME CARE INSTRUCTIONS   Follow your health care provider's instructions carefully.  Follow a full liquid diet or other diet as directed by your health care provider. After your symptoms improve, your health care provider may tell you to change your diet. He or she may recommend you eat a high-fiber diet. Fruits and vegetables are good sources of fiber. Fiber makes it easier to pass stool.  Take fiber supplements or probiotics as directed by your health care provider.  Only take medicines as directed by your health care provider.  Keep all your follow-up appointments. SEEK MEDICAL CARE IF:   Your pain does not improve.  You have a hard time eating food.  Your bowel movements do not return to normal. SEEK IMMEDIATE MEDICAL CARE IF:   Your pain becomes worse.  Your symptoms do not get better.  Your symptoms suddenly get worse.  You have a fever.  You have repeated vomiting.  You have bloody or black, tarry stools. MAKE SURE YOU:   Understand these instructions.  Will watch your  condition.  Will get help right away if you are not doing well or get worse. Document Released: 12/08/2004 Document Revised: 03/05/2013 Document Reviewed: 01/23/2013 [facility] Patient Information 2015 [place], [place]. This information  is not intended to replace advice given to you by your health care provider. Make sure you discuss any questions you have with your health care provider.    Hernia A hernia occurs when an internal organ pushes out through a weak spot in the abdominal wall. Hernias most commonly occur in the groin and around the navel. Hernias often can be pushed back into place (reduced). Most hernias tend to get worse over time. Some abdominal hernias can get stuck in the opening (irreducible or incarcerated hernia) and cannot be reduced. An irreducible abdominal hernia which is tightly squeezed into the opening is at risk for impaired blood supply (strangulated hernia). A strangulated hernia is a medical emergency. Because of the risk for an irreducible or strangulated hernia, surgery may be recommended to repair a hernia. CAUSES   Heavy lifting.  Prolonged coughing.  Straining to have a bowel movement.  A cut (incision) made during an abdominal surgery. HOME CARE INSTRUCTIONS   Bed rest is not required. You may continue your normal activities.  Avoid lifting more than 10 pounds (4.5 kg) or straining.  Cough gently. If you are a smoker it is best to stop. Even the best hernia repair can break down with the continual strain of coughing. Even if you do not have your hernia repaired, a cough will continue to aggravate the problem.  Do not wear anything tight over your hernia. Do not try to keep it in with an outside bandage or truss. These can damage abdominal contents if they are trapped within the hernia sac.  Eat a normal diet.  Avoid constipation. Straining over long periods of time will increase hernia size and encourage breakdown of repairs. If you cannot do this with diet alone, stool softeners may be used. SEEK IMMEDIATE MEDICAL CARE IF:   You have a fever.  You develop increasing abdominal pain.  You feel nauseous or vomit.  Your hernia is stuck outside the abdomen, looks discolored, feels  hard, or is tender.  You have any changes in your bowel habits or in the hernia that are unusual for you.  You have increased pain or swelling around the hernia.  You cannot push the hernia back in place by applying gentle pressure while lying down. MAKE SURE YOU:   Understand these instructions.  Will watch your condition.  Will get help right away if you are not doing well or get worse. Document Released: 02/28/2005 Document Revised: 05/23/2011 Document Reviewed: 10/18/2007 [facility] Patient Information 2015 [place], [place]. This information is not intended to replace advice given to you by your health care provider. Make sure you discuss any questions you have with your health care provider.

## 2014-10-15 NOTE — ED Notes (Signed)
Bed: WA03 Expected date:  Expected time:  Means of arrival:  Comments: LLQ pain

## 2014-10-15 NOTE — ED Notes (Addendum)
Patient states he has pain from his hernia x 2 days ago.  Pain radiates and increases at times.  He lifted a heavy filing cabinet on 8-1 and started having pain afterwards. He has some nausea associated with the pain.   Patient states he has had hernia for 15 years and never had trouble with it.

## 2014-10-16 ENCOUNTER — Encounter: Payer: Self-pay | Admitting: Gastroenterology

## 2014-10-25 ENCOUNTER — Other Ambulatory Visit: Payer: Self-pay | Admitting: Family Medicine

## 2014-10-29 NOTE — Telephone Encounter (Signed)
Rx called in 

## 2014-10-29 NOTE — Telephone Encounter (Signed)
Please refill on my behalf while I am not in office - please either call into pharmacy or if printed out please have someone call pt so he knows to pick up. Thanks.

## 2014-11-05 ENCOUNTER — Other Ambulatory Visit: Payer: Self-pay | Admitting: Surgery

## 2014-11-12 ENCOUNTER — Ambulatory Visit (INDEPENDENT_AMBULATORY_CARE_PROVIDER_SITE_OTHER): Payer: BLUE CROSS/BLUE SHIELD | Admitting: Family Medicine

## 2014-11-12 VITALS — BP 128/78 | HR 109 | Temp 98.3°F | Resp 16 | Ht 71.5 in | Wt 232.2 lb

## 2014-11-12 DIAGNOSIS — R1032 Left lower quadrant pain: Secondary | ICD-10-CM | POA: Diagnosis not present

## 2014-11-12 LAB — POCT CBC
Granulocyte percent: 81.9 %G — AB (ref 37–80)
HCT, POC: 55.4 % — AB (ref 43.5–53.7)
Hemoglobin: 18.1 g/dL (ref 14.1–18.1)
Lymph, poc: 2.2 (ref 0.6–3.4)
MCH, POC: 28 pg (ref 27–31.2)
MCHC: 32.6 g/dL (ref 31.8–35.4)
MCV: 85.8 fL (ref 80–97)
MID (CBC): 0.8 (ref 0–0.9)
MPV: 8.6 fL (ref 0–99.8)
PLATELET COUNT, POC: 282 10*3/uL (ref 142–424)
POC Granulocyte: 13.6 — AB (ref 2–6.9)
POC LYMPH PERCENT: 13.3 %L (ref 10–50)
POC MID %: 4.8 %M (ref 0–12)
RBC: 6.45 M/uL — AB (ref 4.69–6.13)
RDW, POC: 15 %
WBC: 16.6 10*3/uL — AB (ref 4.6–10.2)

## 2014-11-12 MED ORDER — CIPROFLOXACIN HCL 500 MG PO TABS: 500.0000 mg | ORAL_TABLET | Freq: Two times a day (BID) | ORAL | Status: DC

## 2014-11-12 MED ORDER — TRAMADOL HCL 50 MG PO TABS: 50.0000 mg | ORAL_TABLET | Freq: Three times a day (TID) | ORAL | Status: DC | PRN

## 2014-11-12 MED ORDER — METRONIDAZOLE 500 MG PO TABS: 500.0000 mg | ORAL_TABLET | Freq: Two times a day (BID) | ORAL | Status: DC

## 2014-11-12 NOTE — Progress Notes (Signed)
Jose Shaffer is a 44 y.o. male who presents today for abdominal pain for about 2 days now.  He was previously dx with diverticulitis in beginning of August and tx with cipro/flagly and referred to GI.  He has an initial visit with the GI doctor's Deboraha Sprang) for colonoscopy/evaluation.  Current Sx include soreness in lower abdomen, bloating, loose stool w/o melena/hematochezia.  Denies fever, chills or sweats but also having some cramping as well.  Slowly getting worse.  He denies anorexia at this time.   Past Medical History  Diagnosis Date  . Allergy   . ADD (attention deficit disorder)   . Insomnia   . Anxiety state, unspecified 03/10/2013    History  Smoking status  . Never Smoker   Smokeless tobacco  . Never Used    Family History  Problem Relation Age of Onset  . Hypertension Father   . Cancer Maternal Grandmother 5    breast cancer  . Hypertension Paternal Grandmother   . COPD Paternal Grandfather     lung cancer; tobacco use, Doctor, general practice  . Cancer Maternal Aunt     breast cancer, late 73's  . Cancer Maternal Aunt     breast cancer; diagnosed late 50's    Current Outpatient Prescriptions on File Prior to Visit  Medication Sig Dispense Refill  . clonazePAM (KLONOPIN) 1 MG tablet Take 1-2 tablets (1-2 mg total) by mouth at bedtime. (Patient taking differently: Take 1-2 mg by mouth every evening. ) 60 tablet 2  . methylphenidate (CONCERTA) 54 MG PO CR tablet Take 1 tablet (54 mg total) by mouth every morning. Brand name concerta medically necessary. (Patient taking differently: Take 54 mg by mouth daily. Brand name concerta medically necessary.) 30 tablet 0  . methylphenidate (CONCERTA) 54 MG PO CR tablet Take 1 tablet (54 mg total) by mouth every morning. BRAND NAME MEDICALLY NECESSARY 30 tablet 0  . methylphenidate (CONCERTA) 54 MG PO CR tablet Take 1 tablet (54 mg total) by mouth every morning. BRAND NAME MEDICALLY NECESSARY 30 tablet 0   No current  facility-administered medications on file prior to visit.    ROS: Per HPI.  All other systems reviewed and are negative.   Physical Exam Filed Vitals:   11/12/14 1903  BP: 128/78  Pulse: 109  Temp: 98.3 F (36.8 C)  Resp: 16    Physical Examination: General appearance - alert, well appearing, and in no distress Chest - clear to auscultation, no wheezes, rales or rhonchi, symmetric air entry Heart - normal rate and regular rhythm Abdomen - Soft, TTP LLQ and some RLQ.  No Rovsing or Mcburney's point TTP.  No palpable hernia     Chemistry      Component Value Date/Time   NA 134* 10/15/2014 0828   K 4.2 10/15/2014 0828   CL 102 10/15/2014 0828   CO2 24 10/15/2014 0828   BUN 12 10/15/2014 0828   CREATININE 1.20 10/15/2014 0828      Component Value Date/Time   CALCIUM 8.8* 10/15/2014 0828   ALKPHOS 46 01/12/2007 2132   AST 73* 01/12/2007 2132   ALT 48 01/12/2007 2132   BILITOT 2.1* 01/12/2007 2132      Lab Results  Component Value Date   WBC 18.4* 10/15/2014   HGB 18.5* 10/15/2014   HCT 53.6* 10/15/2014   MCV 84.9 10/15/2014   PLT 286 10/15/2014   Lab Results  Component Value Date   TSH 1.48 04/08/2010   No results found for: HGBA1C

## 2014-11-12 NOTE — Assessment & Plan Note (Addendum)
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  He has appointment scheduled with GI doctors in September for further evaluation.  Highly doubt appendicitis with no anorexia or fever at this time but if his Sx start localizing to the RLQ or is having anorexia/fevers, advised to go to ED for further work up - Will obtain CBC along with BMET to evaluate etiology.   Addendum: CBC with L shift and leukocytosis.  Will tx with repeat course of cipro/flagyl with return to clinic precautions or ED precautions.  Pt reverberates understanding and plan.

## 2014-11-13 LAB — BASIC METABOLIC PANEL
BUN: 15 mg/dL (ref 7–25)
CALCIUM: 10 mg/dL (ref 8.6–10.3)
CO2: 24 mmol/L (ref 20–31)
Chloride: 96 mmol/L — ABNORMAL LOW (ref 98–110)
Creat: 1.31 mg/dL (ref 0.60–1.35)
Glucose, Bld: 69 mg/dL (ref 65–99)
POTASSIUM: 4.3 mmol/L (ref 3.5–5.3)
Sodium: 133 mmol/L — ABNORMAL LOW (ref 135–146)

## 2014-11-13 NOTE — Progress Notes (Signed)
Patient ID: Jose Shaffer, male   DOB: 1970-08-03, 44 y.o.   MRN: [id number] Reviewed documentation and agree w/ assessment and plan. Pt will recheck w/ me in 48 hrs -> sooner if worsening. [name], MD MPH

## 2014-11-30 ENCOUNTER — Other Ambulatory Visit: Payer: Self-pay | Admitting: Family Medicine

## 2014-12-01 ENCOUNTER — Ambulatory Visit (INDEPENDENT_AMBULATORY_CARE_PROVIDER_SITE_OTHER): Payer: BLUE CROSS/BLUE SHIELD | Admitting: Family Medicine

## 2014-12-01 VITALS — BP 138/74 | HR 83 | Temp 97.8°F | Resp 14 | Ht 71.0 in | Wt 233.8 lb

## 2014-12-01 DIAGNOSIS — K429 Umbilical hernia without obstruction or gangrene: Secondary | ICD-10-CM | POA: Diagnosis not present

## 2014-12-01 DIAGNOSIS — K5732 Diverticulitis of large intestine without perforation or abscess without bleeding: Secondary | ICD-10-CM | POA: Diagnosis not present

## 2014-12-01 DIAGNOSIS — F988 Other specified behavioral and emotional disorders with onset usually occurring in childhood and adolescence: Secondary | ICD-10-CM

## 2014-12-01 DIAGNOSIS — F411 Generalized anxiety disorder: Secondary | ICD-10-CM | POA: Diagnosis not present

## 2014-12-01 DIAGNOSIS — G4730 Sleep apnea, unspecified: Secondary | ICD-10-CM

## 2014-12-01 DIAGNOSIS — F909 Attention-deficit hyperactivity disorder, unspecified type: Secondary | ICD-10-CM

## 2014-12-01 DIAGNOSIS — G4700 Insomnia, unspecified: Secondary | ICD-10-CM

## 2014-12-01 MED ORDER — AMPHETAMINE-DEXTROAMPHET ER 10 MG PO CP24: 10.0000 mg | ORAL_CAPSULE | Freq: Two times a day (BID) | ORAL | Status: DC | PRN

## 2014-12-01 MED ORDER — CLONAZEPAM 1 MG PO TABS: 1.0000 mg | ORAL_TABLET | Freq: Every day | ORAL | Status: DC

## 2014-12-01 NOTE — Progress Notes (Signed)
Subjective:  This chart was scribed for Jose Sorenson, MD by [name], ED Scribe. This patient was seen in room 1 and the patient's care was started at 5:10 PM.   Patient ID: Jose Shaffer, male    DOB: 1970-12-15, 44 y.o.   MRN: [id number]  HPI   Chief Complaint  Patient presents with  . Medication Refill    Klonopin  . Medication Consultation    Concerta vs. Adderall, Contraindication With Tramadol and Concerta   HPI Comments: Jose Shaffer is a 44 y.o. male who presents to the [facility] for a follow up. He's had several recurrent episodes of diverticulitis, recently appears to be scheduled for laparoscopic hernia repair in 2 months. Last seen about 2 weeks ago by a resident working with me. He was put on a 10 day coarse of cipro and flagyl at that time. He did have leukocytosis of 6.6 with left shift.  Pt had abdominal pelvic CT scan on aug 3, which showed thickening of sigmoid colon as well as para umbilical hernia.   He was told he was possibly lactose intolerant but states he eats a pint of ben and Jerry's every night. Pt has cut back on diary products and has increased his fiber intake. For the past couple days he's had normal BM since increasing fiber in his diet. No abdominal pain. He has scheduled appointment with GI. He has hx of irregular BM with occasional constipation. He denies family hx of colon cancer.  During last visit with me, pt had told me adderall 20 mg had made him lethargic. Since then, he had been cut adderall tablet in half taking half in the morning with a cup of coffee and the other half later, only if needed. Once he finished adderall he filled the concerta. Pt states initially while on concerta he was tolerating medication well, but during the later half of the week he began to feel sluggish and tired after sleeping for 8 hours, when he usually only sleeps for 6-7 hours waking up feeling refreshed. He also felt anxious while on  concerta. Pt would like to switch back to adderall. He also reports intolerance to tramadol, prescribed at last visit, stating he felt off while on medication, forgetting his address. He is tolerating klonopin well and is sleeping much better.   Patient Active Problem List   Diagnosis Date Noted  . Abdominal pain, left lower quadrant 11/12/2014  . Sleep apnea 04/15/2014  . Snoring 04/15/2014  . Insomnia 03/10/2013  . Anxiety state 03/10/2013  . ADD (attention deficit disorder) 09/11/2011   Past Medical History  Diagnosis Date  . Allergy   . ADD (attention deficit disorder)   . Insomnia   . Anxiety state, unspecified 03/10/2013  . Diverticulitis    Prior to Admission medications   Medication Sig Start Date End Date Taking? Authorizing Provider  clonazePAM (KLONOPIN) 1 MG tablet Take 1-2 tablets (1-2 mg total) by mouth at bedtime. Patient taking differently: Take 1-2 mg by mouth every evening.  07/11/14 01/04/15 Yes Sherren Mocha, MD  methylphenidate (CONCERTA) 54 MG PO CR tablet Take 1 tablet (54 mg total) by mouth every morning. Brand name concerta medically necessary. Patient taking differently: Take 54 mg by mouth daily. Brand name concerta medically necessary. 07/11/14  Yes Sherren Mocha, MD  traMADol (ULTRAM) 50 MG tablet Take 1 tablet (50 mg total) by mouth every 8 (eight) hours as needed. 11/12/14  Yes Briscoe Deutscher, DO  ciprofloxacin (CIPRO) 500 MG tablet Take 1 tablet (500 mg total) by mouth 2 (two) times daily. Patient not taking: Reported on 12/01/2014 11/12/14   Briscoe Deutscher, DO  methylphenidate (CONCERTA) 54 MG PO CR tablet Take 1 tablet (54 mg total) by mouth every morning. BRAND NAME MEDICALLY NECESSARY Patient not taking: Reported on 12/01/2014 07/11/14   Sherren Mocha, MD  methylphenidate (CONCERTA) 54 MG PO CR tablet Take 1 tablet (54 mg total) by mouth every morning. BRAND NAME MEDICALLY NECESSARY Patient not taking: Reported on 12/01/2014 07/11/14   Sherren Mocha, MD  metroNIDAZOLE  (FLAGYL) 500 MG tablet Take 1 tablet (500 mg total) by mouth 2 (two) times daily. Patient not taking: Reported on 12/01/2014 11/12/14   Briscoe Deutscher, DO   Review of Systems  Gastrointestinal: Negative for abdominal pain, diarrhea, constipation and abdominal distention.  Psychiatric/Behavioral: Negative for sleep disturbance.   Objective:   Physical Exam  Constitutional: He is oriented to person, place, and time. He appears well-developed and well-nourished. No distress.  HENT:  Head: Normocephalic and atraumatic.  Eyes: Conjunctivae and EOM are normal.  Neck: Neck supple.  Cardiovascular: Normal rate, regular rhythm and normal heart sounds.  Exam reveals no gallop and no friction rub.   No murmur heard. Pulmonary/Chest: Effort normal and breath sounds normal. No respiratory distress. He has no wheezes. He has no rales. He exhibits no tenderness.  Abdominal: Soft. Bowel sounds are normal. He exhibits no distension and no mass. There is no hepatosplenomegaly. There is no tenderness.  Musculoskeletal: Normal range of motion.  Neurological: He is alert and oriented to person, place, and time.  Skin: Skin is warm and dry.  Psychiatric: He has a normal mood and affect. His behavior is normal.  Nursing note and vitals reviewed.  Filed Vitals:   12/01/14 1704  BP: 138/74  Pulse: 83  Temp: 97.8 F (36.6 C)  TempSrc: Oral  Resp: 14  Height:  (1.803 m)  Weight: 233 lb 12.8 oz (106.051 kg)  SpO2: 98%    Assessment & Plan:  ADD (attention deficit disorder) - Plan: has been on concerta for > 15 yrs - was likely on adderall and vyvanse prior.  Last winter he noted increasing tolerance of concerta as well as increasing irritation and anxiety. Switched to adderall xr  then  w/o improved control and was wearing off to quickly so switched back to concerta. Then had sleep study, started on cpap. He notcied improvement in sleep when he was off concerta during acute diverticulitis and so  then restarted on adderall xr 10 qam w/ second dose prn which he would like to cont on. May be more efffective this time due to increased rest w/ cpap.  Call in 3 wks with report on how he is doing on new regimen  Insomnia - doing well on prn klonopin, refilled.  Anxiety state - improving off concerta  Sleep apnea - increasing compliance w/ cpap - didn't have cpap when we tried adderall prior to hoping it will be more effective now that he is well-rested  Diverticulitis - x 2 over the past 2 mos.  Has appt w/ GI next wk for initial eval.  Umbilical/inguinal hernia - sched for repair in 2 mos - 11/10.  Meds ordered this encounter  Medications  . amphetamine-dextroamphetamine (ADDERALL XR) 10 MG 24 hr capsule    Sig: Take 1 capsule (10 mg total) by mouth 2 (two) times daily as needed.    Dispense:  60 capsule    Refill:  0  . clonazePAM (KLONOPIN) 1 MG tablet    Sig: Take 1-2 tablets (1-2 mg total) by mouth at bedtime.    Dispense:  60 tablet    Refill:  2   By signing my name below, I, Raven Small, attest that this documentation has been prepared under the direction and in the presence of Jose Sorenson, MD.  Electronically Signed: [name], ED Scribe. 12/01/2014. 6:19 PM.

## 2014-12-01 NOTE — Patient Instructions (Signed)
Low-Fiber Diet °Fiber is found in fruits, vegetables, and whole grains. A low-fiber diet restricts fibrous foods that are not digested in the small intestine. A diet containing about 10-15 grams of fiber per day is considered low fiber. Low-fiber diets may be used to: °· Promote healing and rest the bowel during intestinal flare-ups. °· Prevent blockage of a partially obstructed or narrowed gastrointestinal tract. °· Reduce fecal weight and volume. °· Slow the movement of feces. °You may be on a low-fiber diet as a transitional diet following surgery, after an injury (trauma), or because of a short (acute) or lifelong (chronic) illness. Your health care provider will determine the length of time you need to stay on this diet.  °WHAT DO I NEED TO KNOW ABOUT A LOW-FIBER DIET? °Always check the fiber content on the packaging's Nutrition Facts label, especially on foods from the grains list. Ask your dietitian if you have questions about specific foods that are related to your condition, especially if the food is not listed below. In general, a low-fiber food will have less than 2 g of fiber. °WHAT FOODS CAN I EAT? °Grains °All breads and crackers made with white flour. Sweet rolls, doughnuts, waffles, pancakes, French toast, bagels. Pretzels, Melba toast, zwieback. Well-cooked cereals, such as cornmeal, farina, or cream cereals. Dry cereals that do not contain whole grains, fruit, or nuts, such as refined corn, wheat, rice, and oat cereals. Potatoes prepared any way without skins, plain pastas and noodles, refined white rice. Use white flour for baking and making sauces. Use allowed list of grains for casseroles, dumplings, and puddings.  °Vegetables °Strained tomato and vegetable juices. Fresh lettuce, cucumber, spinach. Well-cooked (no skin or pulp) or canned vegetables, such as asparagus, bean sprouts, beets, carrots, green beans, mushrooms, potatoes, pumpkin, spinach, yellow squash, tomato sauce/puree, turnips,  yams, and zucchini. Keep servings limited to ½ cup.  °Fruits °All fruit juices except prune juice. Cooked or canned fruits without skin and seeds, such as applesauce, apricots, cherries, fruit cocktail, grapefruit, grapes, mandarin oranges, melons, peaches, pears, pineapple, and plums. Fresh fruits without skin, such as apricots, avocados, bananas, melons, pineapple, nectarines, and peaches. Keep servings limited to ½ cup or 1 piece.   °Meat and Other Protein Sources °Ground or well-cooked tender beef, ham, veal, lamb, pork, or poultry. Eggs, plain cheese. Fish, oysters, shrimp, lobster, and other seafood. Liver, organ meats. Smooth nut butters. °Dairy °All milk products and alternative dairy substitutes, such as soy, rice, almond, and coconut, not containing added whole nuts, seeds, or added fruit. °Beverages °Decaf coffee, fruit, and vegetable juices or smoothies (small amounts, with no pulp or skins, and with fruits from allowed list), sports drinks, herbal tea. °Condiments °Ketchup, mustard, vinegar, cream sauce, cheese sauce, cocoa powder. Spices in moderation, such as allspice, basil, bay leaves, celery powder or leaves, cinnamon, cumin powder, curry powder, ginger, mace, marjoram, onion or garlic powder, oregano, paprika, parsley flakes, ground pepper, rosemary, sage, savory, tarragon, thyme, and turmeric. °Sweets and Desserts °Plain cakes and cookies, pie made with allowed fruit, pudding, custard, cream pie. Gelatin, fruit, ice, sherbet, frozen ice pops. Ice cream, ice milk without nuts. Plain hard candy, honey, jelly, molasses, syrup, sugar, chocolate syrup, gumdrops, marshmallows. Limit overall sugar intake.  °Fats and Oil °Margarine, butter, cream, mayonnaise, salad oils, plain salad dressings made from allowed foods. Choose healthy fats such as olive oil, canola oil, and omega-3 fatty acids (such as found in salmon or tuna) when possible.  °Other °Bouillon, broth, or cream soups made   from allowed foods.  Any strained soup. Casseroles or mixed dishes made with allowed foods. °The items listed above may not be a complete list of recommended foods or beverages. Contact your dietitian for more options.  °WHAT FOODS ARE NOT RECOMMENDED? °Grains °All whole wheat and whole grain breads and crackers. Multigrains, rye, bran seeds, nuts, or coconut. Cereals containing whole grains, multigrains, bran, coconut, nuts, raisins. Cooked or dry oatmeal, steel-cut oats. Coarse wheat cereals, granola. Cereals advertised as high fiber. Potato skins. Whole grain pasta, wild or brown rice. Popcorn. Coconut flour. Bran, buckwheat, corn bread, multigrains, rye, wheat germ.  °Vegetables °Fresh, cooked or canned vegetables, such as artichokes, asparagus, beet greens, broccoli, Brussels sprouts, cabbage, celery, cauliflower, corn, eggplant, kale, legumes or beans, okra, peas, and tomatoes. Avoid large servings of any vegetables, especially raw vegetables.  °Fruits °Fresh fruits, such as apples with or without skin, berries, cherries, figs, grapes, grapefruit, guavas, kiwis, mangoes, oranges, papayas, pears, persimmons, pineapple, and pomegranate. Prune juice and juices with pulp, stewed or dried prunes. Dried fruits, dates, raisins. Fruit seeds or skins. Avoid large servings of all fresh fruits. °Meats and Other Protein Sources °Tough, fibrous meats with gristle. Chunky nut butter. Cheese made with seeds, nuts, or other foods not recommended. Nuts, seeds, legumes (beans, including baked beans), dried peas, beans, lentils.  °Dairy °Yogurt or cheese that contains nuts, seeds, or added fruit.  °Beverages °Fruit juices with high pulp, prune juice. Caffeinated coffee and teas.  °Condiments °Coconut, maple syrup, pickles, olives. °Sweets and Desserts °Desserts, cookies, or candies that contain nuts or coconut, chunky peanut butter, dried fruits. Jams, preserves with seeds, marmalade. Large amounts of sugar and sweets. Any other dessert made with  fruits from the not recommended list.  °Other °Soups made from vegetables that are not recommended or that contain other foods not recommended.  °The items listed above may not be a complete list of foods and beverages to avoid. Contact your dietitian for more information. °Document Released: 08/20/2001 Document Revised: 03/05/2013 Document Reviewed: 01/21/2013 °[employer]® Patient Information ©2015 [employer]. This information is not intended to replace advice given to you by your health care provider. Make sure you discuss any questions you have with your health care provider. ° °

## 2014-12-10 ENCOUNTER — Ambulatory Visit: Payer: BLUE CROSS/BLUE SHIELD | Admitting: Gastroenterology

## 2014-12-25 ENCOUNTER — Telehealth: Payer: Self-pay | Admitting: Gastroenterology

## 2014-12-25 NOTE — Telephone Encounter (Signed)
-----   Message from Jessee AversAmanda L Lewellyn, CMA sent at 12/11/2014 11:45 AM EDT ----- Charge for no show

## 2015-01-15 ENCOUNTER — Encounter (HOSPITAL_BASED_OUTPATIENT_CLINIC_OR_DEPARTMENT_OTHER): Payer: Self-pay | Admitting: *Deleted

## 2015-01-18 ENCOUNTER — Ambulatory Visit (INDEPENDENT_AMBULATORY_CARE_PROVIDER_SITE_OTHER): Payer: BLUE CROSS/BLUE SHIELD | Admitting: Family Medicine

## 2015-01-18 VITALS — BP 120/74 | HR 80 | Temp 98.2°F | Resp 16 | Ht 71.0 in | Wt 241.2 lb

## 2015-01-18 DIAGNOSIS — G4700 Insomnia, unspecified: Secondary | ICD-10-CM

## 2015-01-18 DIAGNOSIS — F988 Other specified behavioral and emotional disorders with onset usually occurring in childhood and adolescence: Secondary | ICD-10-CM

## 2015-01-18 DIAGNOSIS — F411 Generalized anxiety disorder: Secondary | ICD-10-CM

## 2015-01-18 DIAGNOSIS — F909 Attention-deficit hyperactivity disorder, unspecified type: Secondary | ICD-10-CM

## 2015-01-18 MED ORDER — AMPHETAMINE-DEXTROAMPHET ER 10 MG PO CP24: ORAL_CAPSULE | ORAL | Status: DC

## 2015-01-18 MED ORDER — CLONAZEPAM 1 MG PO TABS: 1.0000 mg | ORAL_TABLET | Freq: Every day | ORAL | Status: DC

## 2015-01-18 NOTE — Patient Instructions (Signed)
If you are doing well in 3 months, ok to call in for refills and then I will see you back in the office in 6 months for a visit (it might be time for a full CPE and labs at that time if you want to sched).

## 2015-01-18 NOTE — Progress Notes (Signed)
Subjective:    Patient ID: Jose AllegraChristopher Shaffer, male    DOB: 1970-11-01, 44 y.o.   MRN: [id number] Chief Complaint  Patient presents with  . Medication Refill    clonazepam and adderall    HPI  Jose Shaffer has been doing really well.  After several medication changes, we put him back on lower doses of adderall XR 10mg  1 with breakfast and 1 with lunch. He has then been taking a clonazepam with lunch and again with dinner for sleep and has been doing a lot better on this.  Many of his add concentration sxs seem to overlap with anxiety and so he notes that after his add sxs are much worse in the morning - it takes a while for his brain to get moving.  On his current medication regimen he seems to be doing the best he has for a while.  His bowels have improved. He cut out the gallon of milk and pint of ben and jerry's daily and has been feeling much better.  Past Medical History  Diagnosis Date  . Allergy   . ADD (attention deficit disorder)   . Insomnia   . Anxiety state, unspecified 03/10/2013  . Diverticulitis   . Sleep apnea    Past Surgical History  Procedure Laterality Date  . Quadriceps tendon repair  08/2008   No current outpatient prescriptions on file prior to visit.   No current facility-administered medications on file prior to visit.   Allergies  Allergen Reactions  . Ibuprofen Other (See Comments)    Per patient feels like KNOT in throat and itching with rash  . Tramadol Anxiety   Family History  Problem Relation Age of Onset  . Hypertension Father   . Cancer Maternal Grandmother [id number]    breast cancer  . Hypertension Paternal Grandmother   . COPD Paternal Grandfather     lung cancer; tobacco use, Doctor, general practicesteel mill worker  . Cancer Maternal Aunt     breast cancer, late 4650's  . Cancer Maternal Aunt     breast cancer; diagnosed late 3850's   Social History   Social History  . Marital Status: Married    Spouse Name: Tristan SchroederLien  . Number of Children: 0  . Years of  Education: 12+   Occupational History  . truck driver   . contractor    Social History Main Topics  . Smoking status: Never Smoker   . Smokeless tobacco: Never Used  . Alcohol Use: No  . Drug Use: No  . Sexual Activity:    Partners: Female    CopyBirth Control/ Protection: None   Other Topics Concern  . None   Social History Narrative   Lives with wife, Tristan SchroederLien   Caffeine 1 cup avg twice weekly     Review of Systems  Constitutional: Negative for diaphoresis, activity change, appetite change, fatigue and unexpected weight change.  Respiratory: Negative for chest tightness and shortness of breath.   Cardiovascular: Negative for chest pain and palpitations.  Gastrointestinal: Negative for nausea, vomiting, abdominal pain and constipation.  Neurological: Negative for dizziness, tremors, syncope and light-headedness.  Psychiatric/Behavioral: Positive for decreased concentration. Negative for suicidal ideas, hallucinations, confusion, sleep disturbance, self-injury, dysphoric mood and agitation. The patient is nervous/anxious. The patient is not hyperactive.        Objective:  BP 120/74 mmHg  Pulse 80  Temp(Src) 98.2 F (36.8 C) (Oral)  Resp 16  Ht 5\' 11"  (1.803 m)  Wt 241 lb 3.2 oz (109.408 kg)  BMI 33.66 kg/m2  SpO2 98%  Physical Exam  Constitutional: He is oriented to person, place, and time. He appears well-developed and well-nourished. No distress.  HENT:  Head: Normocephalic and atraumatic.  Eyes: Conjunctivae are normal. Pupils are equal, round, and reactive to light. No scleral icterus.  Neck: Normal range of motion. Neck supple. No thyromegaly present.  Cardiovascular: Normal rate, regular rhythm, normal heart sounds and intact distal pulses.   Pulmonary/Chest: Effort normal and breath sounds normal. No respiratory distress.  Musculoskeletal: He exhibits no edema.  Lymphadenopathy:    He has no cervical adenopathy.  Neurological: He is alert and oriented to person,  place, and time.  Skin: Skin is warm and dry. He is not diaphoretic.  Psychiatric: He has a normal mood and affect. His behavior is normal.          Assessment & Plan:  Doing great on current regimen of adderall XR  bid and klonopin  bid. Ok to call in 3 months for medicaiton refills as long as symptoms are stable as he has been seen very frequently with recent medication changes but is back on lower doses and doing better.  If any changes are needed, come in to see - using  XR bid. 1. ADD (attention deficit disorder)   2. Insomnia   3. Anxiety state     Meds ordered this encounter  Medications  . amphetamine-dextroamphetamine (ADDERALL XR) 10 MG 24 hr capsule    Sig: Use 1-2 tablets by mouth up to twice a day as instructed by physician for ADD    Dispense:  60 capsule    Refill:  0  . amphetamine-dextroamphetamine (ADDERALL XR) 10 MG 24 hr capsule    Sig: Use 1-2 tablets by mouth up to twice a day as instructed by physician for ADD    Dispense:  60 capsule    Refill:  0    May fill 30 d from date written  . amphetamine-dextroamphetamine (ADDERALL XR) 10 MG 24 hr capsule    Sig: Use 1-2 tablets by mouth up to twice a day as instructed by physician for ADD    Dispense:  60 capsule    Refill:  0    May fill 60d from date written  . clonazePAM (KLONOPIN) 1 MG tablet    Sig: Take 1-2 tablets (1-2 mg total) by mouth at bedtime.    Dispense:  60 tablet    Refill:  5    [name], MD MPH

## 2015-01-21 NOTE — H&P (Signed)
Jose Shaffer  Location: [facility] Patient #: [id number] DOB: 08-16-1970 Married / Language: English / Race: White Male   History of Present Illness Patient words: hernia.  The patient is a 44 year old male who presents for an evaluation of a hernia. This gentleman is referred by the emergency department. He was seen there several weeks ago for lower abdominal pain. He has known umbilical hernia and running or hernia. This discomfort however was different. He was found at that time to have mild diverticulitis or colitis on a CT scan. He was placed on oral Cipro and Flagyl. He reports that his discomfort has totally resolved. He has noticed a bulge at his umbilicus and right groin for many years. He used to be a Pharmacist, community. His bowel movements have returned to normal. That was his first ever episode of colitis. He reports that there is no family history of colon cancer but there is a history of diverticulitis. He has never had a colonoscopy. He reports lactose intolerance. He is otherwise without complaints.   Other Problems ( Anxiety Disorder Inguinal Hernia Sleep Apnea  Allergies Fay Records, CMA; Ibuprofen IB *ANALGESICS - ANTI-INFLAMMATORY*  Medication History Fay Records, CMA; ClonazePAM (1MG  Tablet, Oral) Active. MetroNIDAZOLE (500MG  Tablet, Oral) Active. Ciprofloxacin HCl (500MG  Tablet, Oral) Active. ClonazePAM (0.5MG  Tablet, Oral) Active. Concerta (54MG  Tablet ER, Oral) Active. Medications Reconciled  Social History Fay Records, [place]; Caffeine use Carbonated beverages, Coffee. Illicit drug use Prefer to discuss with provider. No alcohol use Tobacco use Never smoker.  Family History Fay Records, CMA; Arthritis Family Members In General. Diabetes Mellitus Family Members In General. Hypertension Father. Ischemic Bowel Disease Father.    Review of Systems Fay Records CMA; General Not Present- Appetite Loss,  Chills, Fatigue, Fever, Night Sweats, Weight Gain and Weight Loss. Skin Not Present- Change in Wart/Mole, Dryness, Hives, Jaundice, New Lesions, Non-Healing Wounds, Rash and Ulcer. HEENT Present- Seasonal Allergies and Wears glasses/contact lenses. Not Present- Earache, Hearing Loss, Hoarseness, Nose Bleed, Oral Ulcers, Ringing in the Ears, Sinus Pain, Sore Throat, Visual Disturbances and Yellow Eyes. Respiratory Not Present- Bloody sputum, Chronic Cough, Difficulty Breathing, Snoring and Wheezing. Breast Not Present- Breast Mass, Breast Pain, Nipple Discharge and Skin Changes. Cardiovascular Not Present- Chest Pain, Difficulty Breathing Lying Down, Leg Cramps, Palpitations, Rapid Heart Rate, Shortness of Breath and Swelling of Extremities. Gastrointestinal Not Present- Abdominal Pain, Bloating, Bloody Stool, Change in Bowel Habits, Chronic diarrhea, Constipation, Difficulty Swallowing, Excessive gas, Gets full quickly at meals, Hemorrhoids, Indigestion, Nausea, Rectal Pain and Vomiting. Male Genitourinary Not Present- Blood in Urine, Change in Urinary Stream, Frequency, Impotence, Nocturia, Painful Urination, Urgency and Urine Leakage. Musculoskeletal Not Present- Back Pain, Joint Pain, Joint Stiffness, Muscle Pain, Muscle Weakness and Swelling of Extremities. Neurological Not Present- Decreased Memory, Fainting, Headaches, Numbness, Seizures, Tingling, Tremor, Trouble walking and Weakness. Psychiatric Present- Anxiety. Not Present- Bipolar, Change in Sleep Pattern, Depression, Fearful and Frequent crying. Endocrine Not Present- Cold Intolerance, Excessive Hunger, Hair Changes, Heat Intolerance, Hot flashes and New Diabetes. Hematology Not Present- Easy Bruising, Excessive bleeding, Gland problems, HIV and Persistent Infections.  Vitals Fay Records CMA;   Weight: 235 lb Height: 71in Body Surface Area: 2.31 m Body Mass Index: 32.78 kg/m  Temp.: 97.60F(Oral)  Pulse: 90 (Regular)   BP: 132/68 (Sitting, Left Arm, Standard)     Physical Exam (Ameliarose Shark A. [name] MD;  General Mental Status-Alert. General Appearance-Consistent with stated age. Hydration-Well hydrated. Voice-Normal.  Head and Neck Head-normocephalic, atraumatic with no lesions or  palpable masses. Trachea-midline.  Eye Eyeball - Bilateral-Extraocular movements intact. Sclera/Conjunctiva - Bilateral-No scleral icterus.  Chest and Lung Exam Chest and lung exam reveals -quiet, even and easy respiratory effort with no use of accessory muscles and on auscultation, normal breath sounds, no adventitious sounds and normal vocal resonance. Inspection Chest Wall - Normal. Back - normal.  Cardiovascular Cardiovascular examination reveals -normal heart sounds, regular rate and rhythm with no murmurs and normal pedal pulses bilaterally.  Abdomen Inspection Skin - Scar - no surgical scars. Hernias - Umbilical hernia - Incarcerated. Inguinal hernia - Right - Reducible. Palpation/Percussion Palpation and Percussion of the abdomen reveal - Soft, Non Tender, No Rebound tenderness, No Rigidity (guarding) and No hepatosplenomegaly. Auscultation Auscultation of the abdomen reveals - Bowel sounds normal.  Neurologic Neurologic evaluation reveals -alert and oriented x 3 with no impairment of recent or remote memory. Mental Status-Normal.  Musculoskeletal Normal Exam - Left-Upper Extremity Strength Normal and Lower Extremity Strength Normal. Normal Exam - Right-Upper Extremity Strength Normal, Lower Extremity Weakness.    Assessment & Plan   BILATERAL INGUINAL HERNIA (550.92  K40.20)  Impression: I discussed the diagnosis of colitis with him as well as of his hernias. Regarding the diverticulitis or colitis, he will be referred to gastroenterology for consideration of a colonoscopy. Regarding his hernias, I would recommend bilateral laparoscopic inguinal hernia repair with  mesh as well as umbilical hernia repair. I cannot feel the specific left inguinal hernia defect but I can see it on CAT scan and I do believe it is present. I discussed the risks of surgery which includes but is not limited to bleeding, infection, recurrent hernias, chronic pain, nerve entrapment, etc. I also discussed postoperative recovery. He wishes to proceed. I gave him educational material regarding hernia surgery Current Plans Pt Education - Pamphlet Given - Laparoscopic Hernia Repair: discussed with patient and provided information.

## 2015-01-22 ENCOUNTER — Ambulatory Visit (HOSPITAL_BASED_OUTPATIENT_CLINIC_OR_DEPARTMENT_OTHER): Payer: BLUE CROSS/BLUE SHIELD | Admitting: Anesthesiology

## 2015-01-22 ENCOUNTER — Encounter (HOSPITAL_BASED_OUTPATIENT_CLINIC_OR_DEPARTMENT_OTHER)
Admission: RE | Disposition: A | Discharge status: Home / Self Care | Payer: Self-pay | Source: Ambulatory Visit | Attending: Surgery

## 2015-01-22 ENCOUNTER — Ambulatory Visit (HOSPITAL_BASED_OUTPATIENT_CLINIC_OR_DEPARTMENT_OTHER)
Admission: RE | Admit: 2015-01-22 | Discharge: 2015-01-22 | Disposition: A | Discharge status: Home / Self Care | Payer: BLUE CROSS/BLUE SHIELD | Source: Ambulatory Visit | Attending: Surgery | Admitting: Surgery

## 2015-01-22 ENCOUNTER — Encounter (HOSPITAL_BASED_OUTPATIENT_CLINIC_OR_DEPARTMENT_OTHER): Payer: Self-pay

## 2015-01-22 DIAGNOSIS — K4020 Bilateral inguinal hernia, without obstruction or gangrene, not specified as recurrent: Secondary | ICD-10-CM | POA: Insufficient documentation

## 2015-01-22 DIAGNOSIS — K429 Umbilical hernia without obstruction or gangrene: Secondary | ICD-10-CM | POA: Diagnosis not present

## 2015-01-22 DIAGNOSIS — K529 Noninfective gastroenteritis and colitis, unspecified: Secondary | ICD-10-CM | POA: Diagnosis not present

## 2015-01-22 DIAGNOSIS — G4730 Sleep apnea, unspecified: Secondary | ICD-10-CM | POA: Insufficient documentation

## 2015-01-22 HISTORY — DX: Sleep apnea, unspecified: G47.30

## 2015-01-22 HISTORY — PX: UMBILICAL HERNIA REPAIR: SHX196

## 2015-01-22 HISTORY — PX: INGUINAL HERNIA REPAIR: SHX194

## 2015-01-22 SURGERY — REPAIR, HERNIA, INGUINAL, BILATERAL, LAPAROSCOPIC
Anesthesia: General | Site: Groin

## 2015-01-22 MED ORDER — GLYCOPYRROLATE 0.2 MG/ML IJ SOLN
INTRAMUSCULAR | Status: AC
  Filled 2015-01-22: qty 2

## 2015-01-22 MED ORDER — LACTATED RINGERS IV SOLN
INTRAVENOUS | Status: DC
  Administered 2015-01-22 (×2): via INTRAVENOUS
  Administered 2015-01-22: 10 mL/h via INTRAVENOUS
  Administered 2015-01-22: 07:00:00 via INTRAVENOUS

## 2015-01-22 MED ORDER — SODIUM CHLORIDE 0.9 % IR SOLN
Status: DC | PRN
  Administered 2015-01-22: 100 mL

## 2015-01-22 MED ORDER — NEOSTIGMINE METHYLSULFATE 10 MG/10ML IV SOLN
INTRAVENOUS | Status: DC | PRN
  Administered 2015-01-22: 3 mg via INTRAVENOUS

## 2015-01-22 MED ORDER — PHENYLEPHRINE HCL 10 MG/ML IJ SOLN
INTRAMUSCULAR | Status: DC | PRN
  Administered 2015-01-22 (×3): 40 ug via INTRAVENOUS

## 2015-01-22 MED ORDER — ONDANSETRON HCL 4 MG/2ML IJ SOLN
INTRAMUSCULAR | Status: AC
  Filled 2015-01-22: qty 2

## 2015-01-22 MED ORDER — ROCURONIUM BROMIDE 50 MG/5ML IV SOLN
INTRAVENOUS | Status: AC
  Filled 2015-01-22: qty 1

## 2015-01-22 MED ORDER — BUPIVACAINE-EPINEPHRINE (PF) 0.5% -1:200000 IJ SOLN
INTRAMUSCULAR | Status: AC
  Filled 2015-01-22: qty 30

## 2015-01-22 MED ORDER — PROPOFOL 500 MG/50ML IV EMUL
INTRAVENOUS | Status: AC
  Filled 2015-01-22: qty 50

## 2015-01-22 MED ORDER — MIDAZOLAM HCL 2 MG/2ML IJ SOLN
1.0000 mg | INTRAMUSCULAR | Status: DC | PRN
Start: 2015-01-22 — End: 2015-01-22
  Administered 2015-01-22: 2 mg via INTRAVENOUS

## 2015-01-22 MED ORDER — NEOSTIGMINE METHYLSULFATE 10 MG/10ML IV SOLN
INTRAVENOUS | Status: AC
  Filled 2015-01-22: qty 1

## 2015-01-22 MED ORDER — SODIUM CHLORIDE 0.9 % IJ SOLN: 3.0000 mL | INTRAMUSCULAR | Status: DC | PRN

## 2015-01-22 MED ORDER — BUPIVACAINE HCL (PF) 0.5 % IJ SOLN
INTRAMUSCULAR | Status: AC
  Filled 2015-01-22: qty 30

## 2015-01-22 MED ORDER — SUCCINYLCHOLINE CHLORIDE 20 MG/ML IJ SOLN
INTRAMUSCULAR | Status: DC | PRN
  Administered 2015-01-22: 100 mg via INTRAVENOUS

## 2015-01-22 MED ORDER — FENTANYL CITRATE (PF) 100 MCG/2ML IJ SOLN
INTRAMUSCULAR | Status: AC
  Filled 2015-01-22: qty 4

## 2015-01-22 MED ORDER — HYDROCODONE-ACETAMINOPHEN 5-325 MG PO TABS: 1.0000 | ORAL_TABLET | ORAL | Status: DC | PRN

## 2015-01-22 MED ORDER — OXYCODONE HCL 5 MG PO TABS
ORAL_TABLET | ORAL | Status: AC
  Filled 2015-01-22: qty 1

## 2015-01-22 MED ORDER — SODIUM CHLORIDE 0.9 % IN NEBU
INHALATION_SOLUTION | RESPIRATORY_TRACT | Status: AC
  Filled 2015-01-22: qty 3

## 2015-01-22 MED ORDER — MIDAZOLAM HCL 2 MG/2ML IJ SOLN
INTRAMUSCULAR | Status: AC
  Filled 2015-01-22: qty 4

## 2015-01-22 MED ORDER — LIDOCAINE HCL (CARDIAC) 20 MG/ML IV SOLN
INTRAVENOUS | Status: AC
  Filled 2015-01-22: qty 5

## 2015-01-22 MED ORDER — HYDROMORPHONE HCL 1 MG/ML IJ SOLN
0.2500 mg | INTRAMUSCULAR | Status: DC | PRN
  Administered 2015-01-22 (×2): 0.25 mg via INTRAVENOUS
  Administered 2015-01-22 (×2): 0.5 mg via INTRAVENOUS

## 2015-01-22 MED ORDER — ACETAMINOPHEN 650 MG RE SUPP: 650.0000 mg | RECTAL | Status: DC | PRN

## 2015-01-22 MED ORDER — SODIUM BICARBONATE 4 % IV SOLN
INTRAVENOUS | Status: AC
  Filled 2015-01-22: qty 5

## 2015-01-22 MED ORDER — FENTANYL CITRATE (PF) 100 MCG/2ML IJ SOLN
50.0000 ug | INTRAMUSCULAR | Status: AC | PRN
  Administered 2015-01-22: 50 ug via INTRAVENOUS
  Administered 2015-01-22: 100 ug via INTRAVENOUS
  Administered 2015-01-22 (×2): 50 ug via INTRAVENOUS

## 2015-01-22 MED ORDER — SCOPOLAMINE 1 MG/3DAYS TD PT72: 1.0000 | MEDICATED_PATCH | Freq: Once | TRANSDERMAL | Status: DC | PRN

## 2015-01-22 MED ORDER — ACETAMINOPHEN 325 MG PO TABS: 650.0000 mg | ORAL_TABLET | ORAL | Status: DC | PRN

## 2015-01-22 MED ORDER — LIDOCAINE HCL (CARDIAC) 10 MG/ML IV SOLN
INTRAVENOUS | Status: DC | PRN
  Administered 2015-01-22: 100 mg via INTRAVENOUS

## 2015-01-22 MED ORDER — SUCCINYLCHOLINE CHLORIDE 20 MG/ML IJ SOLN
INTRAMUSCULAR | Status: AC
  Filled 2015-01-22: qty 1

## 2015-01-22 MED ORDER — BUPIVACAINE HCL (PF) 0.5 % IJ SOLN
INTRAMUSCULAR | Status: DC | PRN
  Administered 2015-01-22: 20 mL

## 2015-01-22 MED ORDER — DEXAMETHASONE SODIUM PHOSPHATE 10 MG/ML IJ SOLN
INTRAMUSCULAR | Status: AC
Start: 2015-01-22 — End: 2015-01-22
  Filled 2015-01-22: qty 1

## 2015-01-22 MED ORDER — OXYCODONE HCL 5 MG PO TABS: 5.0000 mg | ORAL_TABLET | ORAL | Status: DC | PRN

## 2015-01-22 MED ORDER — HYDROMORPHONE HCL 1 MG/ML IJ SOLN
INTRAMUSCULAR | Status: AC
  Filled 2015-01-22: qty 1

## 2015-01-22 MED ORDER — SODIUM CHLORIDE 0.9 % IJ SOLN: 3.0000 mL | Freq: Two times a day (BID) | INTRAMUSCULAR | Status: DC

## 2015-01-22 MED ORDER — GLYCOPYRROLATE 0.2 MG/ML IJ SOLN
0.2000 mg | Freq: Once | INTRAMUSCULAR | Status: AC | PRN
  Administered 2015-01-22: 0.4 mg via INTRAVENOUS

## 2015-01-22 MED ORDER — DEXAMETHASONE SODIUM PHOSPHATE 4 MG/ML IJ SOLN
INTRAMUSCULAR | Status: DC | PRN
  Administered 2015-01-22: 10 mg via INTRAVENOUS

## 2015-01-22 MED ORDER — ROCURONIUM BROMIDE 100 MG/10ML IV SOLN
INTRAVENOUS | Status: DC | PRN
  Administered 2015-01-22: 20 mg via INTRAVENOUS

## 2015-01-22 MED ORDER — MEPERIDINE HCL 25 MG/ML IJ SOLN: 6.2500 mg | INTRAMUSCULAR | Status: DC | PRN

## 2015-01-22 MED ORDER — PHENYLEPHRINE 40 MCG/ML (10ML) SYRINGE FOR IV PUSH (FOR BLOOD PRESSURE SUPPORT)
PREFILLED_SYRINGE | INTRAVENOUS | Status: AC
  Filled 2015-01-22: qty 10

## 2015-01-22 MED ORDER — PROPOFOL 10 MG/ML IV BOLUS
INTRAVENOUS | Status: DC | PRN
  Administered 2015-01-22: 250 mg via INTRAVENOUS
  Administered 2015-01-22: 50 mg via INTRAVENOUS

## 2015-01-22 MED ORDER — NALOXONE HCL 0.4 MG/ML IJ SOLN
INTRAMUSCULAR | Status: AC
  Filled 2015-01-22: qty 1

## 2015-01-22 MED ORDER — SODIUM CHLORIDE 0.9 % IV SOLN: 250.0000 mL | INTRAVENOUS | Status: DC | PRN

## 2015-01-22 MED ORDER — LIDOCAINE HCL (PF) 1 % IJ SOLN
INTRAMUSCULAR | Status: AC
  Filled 2015-01-22: qty 30

## 2015-01-22 MED ORDER — ONDANSETRON HCL 4 MG/2ML IJ SOLN
INTRAMUSCULAR | Status: DC | PRN
  Administered 2015-01-22: 4 mg via INTRAVENOUS

## 2015-01-22 MED ORDER — SODIUM CHLORIDE 0.9 % IJ SOLN
INTRAMUSCULAR | Status: AC
  Filled 2015-01-22: qty 10

## 2015-01-22 MED ORDER — OXYCODONE HCL 5 MG PO TABS
5.0000 mg | ORAL_TABLET | Freq: Once | ORAL | Status: AC | PRN
  Administered 2015-01-22: 5 mg via ORAL

## 2015-01-22 MED ORDER — MORPHINE SULFATE (PF) 2 MG/ML IV SOLN: 1.0000 mg | INTRAVENOUS | Status: DC | PRN

## 2015-01-22 MED ORDER — CEFAZOLIN SODIUM-DEXTROSE 2-3 GM-% IV SOLR
INTRAVENOUS | Status: AC
  Filled 2015-01-22: qty 50

## 2015-01-22 MED ORDER — CEFAZOLIN SODIUM-DEXTROSE 2-3 GM-% IV SOLR
2.0000 g | INTRAVENOUS | Status: AC
  Administered 2015-01-22: 2 g via INTRAVENOUS

## 2015-01-22 MED ORDER — OXYCODONE HCL 5 MG/5ML PO SOLN: 5.0000 mg | Freq: Once | ORAL | Status: AC | PRN

## 2015-01-22 MED ORDER — ARTIFICIAL TEARS OP OINT
TOPICAL_OINTMENT | OPHTHALMIC | Status: AC
  Filled 2015-01-22: qty 3.5

## 2015-01-22 MED ORDER — NALOXONE HCL 0.4 MG/ML IJ SOLN
0.1000 mg | INTRAMUSCULAR | Status: DC | PRN
  Administered 2015-01-22: 0.1 mg via INTRAVENOUS

## 2015-01-22 SURGICAL SUPPLY — 53 items
APPLIER CLIP LOGIC TI 5 (MISCELLANEOUS) IMPLANT
BLADE CLIPPER SURG (BLADE) IMPLANT
BLADE HEX COATED 2.75 (ELECTRODE) ×4 IMPLANT
BLADE SURG 15 STRL LF DISP TIS (BLADE) ×2 IMPLANT
BLADE SURG 15 STRL SS (BLADE) ×2
CANISTER SUCT 1200ML W/VALVE (MISCELLANEOUS) IMPLANT
CHLORAPREP W/TINT 26ML (MISCELLANEOUS) ×4 IMPLANT
COVER BACK TABLE 60X90IN (DRAPES) ×4 IMPLANT
COVER MAYO STAND STRL (DRAPES) ×4 IMPLANT
DECANTER SPIKE VIAL GLASS SM (MISCELLANEOUS) IMPLANT
DEVICE SECURE STRAP 25 ABSORB (INSTRUMENTS) ×8 IMPLANT
DISSECT BALLN SPACEMKR + OVL (BALLOONS) ×4
DISSECTOR BALLN SPACEMKR + OVL (BALLOONS) ×2 IMPLANT
DISSECTOR BLUNT TIP ENDO 5MM (MISCELLANEOUS) IMPLANT
DRAPE LAPAROTOMY 100X72 PEDS (DRAPES) ×4 IMPLANT
DRAPE UTILITY XL STRL (DRAPES) ×4 IMPLANT
DRSG TEGADERM 2-3/8X2-3/4 SM (GAUZE/BANDAGES/DRESSINGS) IMPLANT
ELECT REM PT RETURN 9FT ADLT (ELECTROSURGICAL) ×4
ELECTRODE REM PT RTRN 9FT ADLT (ELECTROSURGICAL) ×2 IMPLANT
GLOVE BIOGEL M 7.0 STRL (GLOVE) ×4 IMPLANT
GLOVE SURG SIGNA 7.5 PF LTX (GLOVE) ×4 IMPLANT
GOWN STRL REUS W/ TWL LRG LVL3 (GOWN DISPOSABLE) ×6 IMPLANT
GOWN STRL REUS W/ TWL XL LVL3 (GOWN DISPOSABLE) ×2 IMPLANT
GOWN STRL REUS W/TWL LRG LVL3 (GOWN DISPOSABLE) ×6
GOWN STRL REUS W/TWL XL LVL3 (GOWN DISPOSABLE) ×2
LIQUID BAND (GAUZE/BANDAGES/DRESSINGS) ×12 IMPLANT
MESH 3DMAX 4X6 LT LRG (Mesh General) ×4 IMPLANT
MESH 3DMAX 4X6 RT LRG (Mesh General) ×4 IMPLANT
MESH VENTRALEX ST 1-7/10 CRC S (Mesh General) ×4 IMPLANT
NEEDLE HYPO 25X1 1.5 SAFETY (NEEDLE) ×4 IMPLANT
NEEDLE INSUFFLATION 14GA 120MM (NEEDLE) ×4 IMPLANT
NS IRRIG 1000ML POUR BTL (IV SOLUTION) ×4 IMPLANT
PACK BASIN DAY SURGERY FS (CUSTOM PROCEDURE TRAY) ×4 IMPLANT
PENCIL BUTTON HOLSTER BLD 10FT (ELECTRODE) ×4 IMPLANT
SET IRRIG TUBING LAPAROSCOPIC (IRRIGATION / IRRIGATOR) ×4 IMPLANT
SET TROCAR LAP APPLE-HUNT 5MM (ENDOMECHANICALS) ×4 IMPLANT
SLEEVE SCD COMPRESS KNEE MED (MISCELLANEOUS) ×4 IMPLANT
SPONGE LAP 4X18 X RAY DECT (DISPOSABLE) IMPLANT
SUT MNCRL AB 4-0 PS2 18 (SUTURE) ×8 IMPLANT
SUT NOVA 0 T19/GS 22DT (SUTURE) IMPLANT
SUT NOVA NAB DX-16 0-1 5-0 T12 (SUTURE) IMPLANT
SUT VIC AB 2-0 SH 27 (SUTURE) ×2
SUT VIC AB 2-0 SH 27XBRD (SUTURE) ×2 IMPLANT
SUT VIC AB 3-0 SH 27 (SUTURE) ×2
SUT VIC AB 3-0 SH 27X BRD (SUTURE) ×2 IMPLANT
SYR CONTROL 10ML LL (SYRINGE) ×4 IMPLANT
TOWEL OR 17X24 6PK STRL BLUE (TOWEL DISPOSABLE) ×4 IMPLANT
TOWEL OR NON WOVEN STRL DISP B (DISPOSABLE) ×4 IMPLANT
TRAY FOLEY CATH SILVER 16FR (SET/KITS/TRAYS/PACK) ×4 IMPLANT
TRAY LAPAROSCOPIC (CUSTOM PROCEDURE TRAY) ×4 IMPLANT
TUBE CONNECTING 20'X1/4 (TUBING)
TUBE CONNECTING 20X1/4 (TUBING) IMPLANT
YANKAUER SUCT BULB TIP NO VENT (SUCTIONS) IMPLANT

## 2015-01-22 NOTE — Anesthesia Procedure Notes (Signed)
Procedure Name: Intubation Date/Time: 01/22/2015 7:26 AM Performed by: Gar GibbonKEETON, Alexx Mcburney S Pre-anesthesia Checklist: Patient identified, Emergency Drugs available, Suction available and Patient being monitored Patient Re-evaluated:Patient Re-evaluated prior to inductionOxygen Delivery Method: Circle System Utilized Preoxygenation: Pre-oxygenation with 100% oxygen Intubation Type: IV induction Ventilation: Mask ventilation without difficulty Laryngoscope Size: Miller and 3 Grade View: Grade III Tube type: Oral Tube size: 8.0 mm Number of attempts: 1 Airway Equipment and Method: Stylet and Oral airway Placement Confirmation: ETT inserted through vocal cords under direct vision,  positive ETCO2 and breath sounds checked- equal and bilateral Secured at: 22 cm Tube secured with: Tape Dental Injury: Teeth and Oropharynx as per pre-operative assessment

## 2015-01-22 NOTE — Interval H&P Note (Signed)
History and Physical Interval Note:no change in H and P  01/22/2015 6:58 AM  Rebeca Allegrahristopher Manders  has presented today for surgery, with the diagnosis of Bilateral Inguinal Hernias and Umbilical Hernia  The various methods of treatment have been discussed with the patient and family. After consideration of risks, benefits and other options for treatment, the patient has consented to  Procedure(s): LAPAROSCOPIC BILATERAL INGUINAL HERNIA REPAIR WITH MESH AND UMBILICAL HERNIA REPAIR (Bilateral) UMBILICAL HERNIA REPAIR (N/A) as a surgical intervention .  The patient's history has been reviewed, patient examined, no change in status, stable for surgery.  I have reviewed the patient's chart and labs.  Questions were answered to the patient's satisfaction.     Dvon Jiles A

## 2015-01-22 NOTE — Discharge Instructions (Signed)
[facility]  UMBILICAL OR INGUINAL HERNIA REPAIR: POST OP INSTRUCTIONS  Always review your discharge instruction sheet given to you by the facility where your surgery was performed. IF YOU HAVE DISABILITY OR FAMILY LEAVE FORMS, YOU MUST BRING THEM TO THE OFFICE FOR PROCESSING.   DO NOT GIVE THEM TO YOUR DOCTOR.  1. A  prescription for pain medication may be given to you upon discharge.  Take your pain medication as prescribed, if needed.  If narcotic pain medicine is not needed, then you may take acetaminophen (Tylenol) or ibuprofen (Advil) as needed. 2. Take your usually prescribed medications unless otherwise directed. 3. If you need a refill on your pain medication, please contact your pharmacy.  They will contact our office to request authorization. Prescriptions will not be filled after 5 pm or on week-ends. 4. You should follow a light diet the first 24 hours after arrival home, such as soup and crackers, etc.  Be sure to include lots of fluids daily.  Resume your normal diet the day after surgery. 5. Most patients will experience some swelling and bruising around the umbilicus or in the groin and scrotum.  Ice packs and reclining will help.  Swelling and bruising can take several days to resolve.  6. It is common to experience some constipation if taking pain medication after surgery.  Increasing fluid intake and taking a stool softener (such as Colace) will usually help or prevent this problem from occurring.  A mild laxative (Milk of Magnesia or Miralax) should be taken according to package directions if there are no bowel movements after 48 hours. 7. Unless discharge instructions indicate otherwise, you may remove your bandages 24-48 hours after surgery, and you may shower at that time.  You may have steri-strips (small skin tapes) in place directly over the incision.  These strips should be left on the skin for 7-10 days.  If your surgeon used skin glue on the  incision, you may shower in 24 hours.  The glue will flake off over the next 2-3 weeks.  Any sutures or staples will be removed at the office during your follow-up visit. 8. ACTIVITIES:  You may resume regular (light) daily activities beginning the next day--such as daily self-care, walking, climbing stairs--gradually increasing activities as tolerated.  You may have sexual intercourse when it is comfortable.  Refrain from any heavy lifting or straining until approved by your doctor. a. You may drive when you are no longer taking prescription pain medication, you can comfortably wear a seatbelt, and you can safely maneuver your car and apply brakes. b. RETURN TO WORK:  __________________________________________________________ 9. You should see your doctor in the office for a follow-up appointment approximately 2-3 weeks after your surgery.  Make sure that you call for this appointment within a day or two after you arrive home to insure a convenient appointment time. 10. OTHER INSTRUCTIONS: NO LIFTING MORE THAN 15 POUNDS FOR 4 WEEKS 11. ICE PACK ALSO FOR PAIN 12. YOU MAY SHOWER STARTING TOMORROW __________________________________________________________________________________________________________________________________________________________________________________________  WHEN TO CALL YOUR DOCTOR: 1. Fever over 101.0 2. Inability to urinate 3. Nausea and/or vomiting 4. Extreme swelling or bruising 5. Continued bleeding from incision. 6. Increased pain, redness, or drainage from the incision  The clinic staff is available to answer your questions during regular business hours.  Please dont hesitate to call and ask to speak to one of the nurses for clinical concerns.  If you have a medical emergency, go to the nearest emergency  room or call 911.  [facility] is always on call at the hospital   [address], [address], [place], [place]  [address] ?  [address] [address], [place], [place]   [id number] [phone] ? [phone] ? FAX [phone]) [phone] Web site: [email or web address]   Post Anesthesia Home Care Instructions  Activity: Get plenty of rest for the remainder of the day. A responsible adult should stay with you for 24 hours following the procedure.  For the next 24 hours, DO NOT: -Drive a car -Advertising copywriterperate machinery -Drink alcoholic beverages -Take any medication unless instructed by your physician -Make any legal decisions or sign important papers.  Meals: Start with liquid foods such as gelatin or soup. Progress to regular foods as tolerated. Avoid greasy, spicy, heavy foods. If nausea and/or vomiting occur, drink only clear liquids until the nausea and/or vomiting subsides. Call your physician if vomiting continues.  Special Instructions/Symptoms: Your throat may feel dry or sore from the anesthesia or the breathing tube placed in your throat during surgery. If this causes discomfort, gargle with warm salt water. The discomfort should disappear within 24 hours.  If you had a scopolamine patch placed behind your ear for the management of post- operative nausea and/or vomiting:  1. The medication in the patch is effective for 72 hours, after which it should be removed.  Wrap patch in a tissue and discard in the trash. Wash hands thoroughly with soap and water. 2. You may remove the patch earlier than 72 hours if you experience unpleasant side effects which may include dry mouth, dizziness or visual disturbances. 3. Avoid touching the patch. Wash your hands with soap and water after contact with the patch.

## 2015-01-22 NOTE — Anesthesia Preprocedure Evaluation (Signed)
Anesthesia Evaluation  Patient identified by MRN, date of birth, ID band Patient awake    Reviewed: Allergy & Precautions, NPO status , Patient's Chart, lab work & pertinent test results  Airway Mallampati: I  TM Distance: >3 FB Neck ROM: Full    Dental  (+) Teeth Intact, Dental Advisory Given   Pulmonary sleep apnea and Continuous Positive Airway Pressure Ventilation ,    breath sounds clear to auscultation       Cardiovascular  Rhythm:Regular Rate:Normal     Neuro/Psych    GI/Hepatic   Endo/Other    Renal/GU      Musculoskeletal   Abdominal   Peds  Hematology   Anesthesia Other Findings   Reproductive/Obstetrics                             Anesthesia Physical Anesthesia Plan  ASA: I  Anesthesia Plan: General   Post-op Pain Management:    Induction: Intravenous  Airway Management Planned: Oral ETT  Additional Equipment:   Intra-op Plan:   Post-operative Plan: Extubation in OR  Informed Consent: I have reviewed the patients History and Physical, chart, labs and discussed the procedure including the risks, benefits and alternatives for the proposed anesthesia with the patient or authorized representative who has indicated his/her understanding and acceptance.   Dental advisory given  Plan Discussed with: CRNA, Anesthesiologist and Surgeon  Anesthesia Plan Comments:         Anesthesia Quick Evaluation

## 2015-01-22 NOTE — Anesthesia Postprocedure Evaluation (Signed)
  Anesthesia Post-op Note  Patient: Jose AllegraChristopher Laduca  Procedure(s) Performed: Procedure(s): LAPAROSCOPIC BILATERAL INGUINAL HERNIA REPAIR WITH MESH AND UMBILICAL HERNIA REPAIR (Bilateral) UMBILICAL HERNIA REPAIR (N/A)  Patient Location: PACU  Anesthesia Type:General  Level of Consciousness: awake and alert   Airway and Oxygen Therapy: Patient Spontanous Breathing  Post-op Pain: mild  Post-op Assessment: Post-op Vital signs reviewed              Post-op Vital Signs: stable  Last Vitals:  Filed Vitals:   01/22/15 1050  BP: 137/84  Pulse: 88  Temp: 36.6 C  Resp: 14    Complications: No apparent anesthesia complications

## 2015-01-22 NOTE — Transfer of Care (Signed)
Immediate Anesthesia Transfer of Care Note  Patient: Jose Shaffer  Procedure(s) Performed: Procedure(s): LAPAROSCOPIC BILATERAL INGUINAL HERNIA REPAIR WITH MESH AND UMBILICAL HERNIA REPAIR (Bilateral) UMBILICAL HERNIA REPAIR (N/A)  Patient Location: PACU  Anesthesia Type:General  Level of Consciousness: sedated and obtunded  Airway & Oxygen Therapy: Patient Spontanous Breathing and Patient connected to face mask oxygen  Post-op Assessment: Report given to RN and Post -op Vital signs reviewed and unstable, Anesthesiologist notified  Post vital signs: Reviewed and stable  Last Vitals:  Filed Vitals:   01/22/15 0900  BP: 94/47  Pulse: 70  Temp:   Resp: 12    Complications: No apparent anesthesia complications

## 2015-01-22 NOTE — Op Note (Signed)
LAPAROSCOPIC BILATERAL INGUINAL HERNIA REPAIR WITH MESH AND UMBILICAL HERNIA REPAIR, UMBILICAL HERNIA REPAIR  Procedure Note  Jose Shaffer 01/22/2015   Pre-op Diagnosis: Bilateral Inguinal Hernias and Umbilical Hernia     Post-op Diagnosis: same  Procedure(s): LAPAROSCOPIC BILATERAL INGUINAL HERNIA REPAIR WITH MESH AND UMBILICAL HERNIA REPAIR WITH MESH  UMBILICAL HERNIA REPAIR  Surgeon(s): Abigail Miyamotoouglas Symphany Fleissner, MD  Anesthesia: General  Staff:  Circulator: Hale BogusSusan H Cloninger, RN Scrub Person: Maryan RuedBrandi C Weaver, RN  Estimated Blood Loss: Minimal                         [name]   Date: 01/22/2015  Time: 8:44 AM

## 2015-01-23 ENCOUNTER — Encounter (HOSPITAL_BASED_OUTPATIENT_CLINIC_OR_DEPARTMENT_OTHER): Payer: Self-pay | Admitting: Surgery

## 2015-01-23 NOTE — Op Note (Signed)
NAME:  Jose Shaffer, Jose Shaffer          ACCOUNT NO.:  [id number]  MEDICAL RECORD NO.:  [id number]  LOCATION:                               FACILITY:  [facility]  PHYSICIAN:  Abigail Miyamoto, M.D. DATE OF BIRTH:  06/03/1970  DATE OF PROCEDURE:  01/22/2015 DATE OF DISCHARGE:                              OPERATIVE REPORT   PREOPERATIVE DIAGNOSES: 1. Bilateral inguinal hernias. 2. Umbilical hernia.  POSTOPERATIVE DIAGNOSES: 1. Bilateral inguinal hernias. 2. Umbilical hernia.  PROCEDURE:  Bilateral laparoscopic inguinal hernia repair and open umbilical hernia repair with mesh.  SURGEON:  Abigail Miyamoto, M.D.  ANESTHESIA:  General and 0.5% Marcaine.  ESTIMATED BLOOD LOSS:  Minimal.  FINDINGS:  The patient was found to have bilateral direct inguinal hernias, which were repaired with two separate pieces of Bard 3DMax Prolene mesh.  The umbilical hernia was repaired with a 4.3-cm round ventral patch.  PROCEDURE IN DETAIL:  The patient was brought to the operating room and identified as Jose Shaffer.  He was placed supine on the operating room table and general anesthesia was induced.  A Foley catheter was then inserted.  His abdomen was then prepped and draped in usual sterile fashion.  I made a small transverse incision at the lower edge of the umbilicus.  I carried this down to the fascia, which was opened just to the right of the midline.  The rectus muscles were then identified and elevated.  The dissecting balloon was then passed underneath the rectus muscle and manipulated toward the pubis.  The dissecting balloon was then insufflated under direct vision dissecting out the preperitoneal space.  Next, the dissecting balloon was removed and insufflation was begun with carbon dioxide.  I then placed two 5-mm ports in the patient's lower midline both under direct vision.  The patient had a chronically-incarcerated right direct inguinal hernia containing omentum.  It was quite  difficult to reduce all the omentum and I had opened the sac in order to do this, but I was finally able to be achieve this.  This allowed air to leak into the peritoneal cavity.  So, I had to place a Veress needle in the left side of the abdomen.  This, then allowed further opening of the preperitoneal space.  I then turned my attention toward the left inguinal area and again, he had a small direct hernia defect on this side without evidence of indirect hernia.  I then brought a piece of Bard 3DMax mesh onto the field.  A left-sided piece was placed through the umbilical port and opened as an onlay on the left inguinal floor.  I then tacked it to Cooper's ligament up the medial abdominal wall and slightly laterally with the absorbable tacker.  Next, I brought a right-sided piece of large Bard 3DMax Prolene mesh onto the field as well.  I placed it through the port at the umbilicus and then placed as an onlay on the right inguinal floor.  I then tacked it to Cooper's ligament up the medial abdominal wall and slightly laterally. Wide coverage of both defects and the cord structures appeared to be achieved.  At this point, hemostasis was also appeared to be achieved. I then removed the midline ports  and allowed the preperitoneal space to decompress.  I then removed the port at the umbilicus.  Next, I separated the large hernia sac from the overlying umbilical skin with electrocautery.  I then excised the sac in its entirety and reduced the omentum back into the abdominal cavity at the umbilical hernia defect. I then identified the fascia circumferentially.  I brought a 4.3-cm round ventral patch onto the field.  It was placed through the fascial opening and then pulled up to the perineum with stay ties.  I then sewed the mesh in place with interrupted #1 Novafil sutures.  I then cut the stay ties and closed the fascia over the top of the mesh with a figure- of-eight #1 Novafil suture as  well.  I then closed the small fascial defect from the inguinal hernia repair with a figure-of-eight 0 Vicryl suture.  All incisions were then anesthetized with Marcaine.  I performed bilateral ilioinguinal nerve blocks with Marcaine as well.  I then closed all skin incisions with 4-0 Monocryl sutures.  Skin glue was then applied.  The patient tolerated the procedure well.  All the counts were correct at the end of procedure.  The patient was then extubated in the operating room and taken in a stable condition to the recovery room.     Abigail Miyamoto, M.D.     DB/MEDQ  D:  01/22/2015  T:  01/22/2015  Job:  [id number]

## 2015-02-02 ENCOUNTER — Ambulatory Visit: Payer: BLUE CROSS/BLUE SHIELD | Admitting: Gastroenterology

## 2015-04-30 ENCOUNTER — Telehealth: Payer: Self-pay

## 2015-04-30 ENCOUNTER — Ambulatory Visit (INDEPENDENT_AMBULATORY_CARE_PROVIDER_SITE_OTHER): Payer: BLUE CROSS/BLUE SHIELD

## 2015-04-30 ENCOUNTER — Ambulatory Visit (INDEPENDENT_AMBULATORY_CARE_PROVIDER_SITE_OTHER): Payer: BLUE CROSS/BLUE SHIELD | Admitting: Family Medicine

## 2015-04-30 VITALS — BP 141/87 | HR 91 | Temp 98.8°F | Resp 16 | Ht 71.0 in | Wt 241.4 lb

## 2015-04-30 DIAGNOSIS — J209 Acute bronchitis, unspecified: Secondary | ICD-10-CM | POA: Diagnosis not present

## 2015-04-30 DIAGNOSIS — R05 Cough: Secondary | ICD-10-CM

## 2015-04-30 DIAGNOSIS — R059 Cough, unspecified: Secondary | ICD-10-CM

## 2015-04-30 DIAGNOSIS — J988 Other specified respiratory disorders: Secondary | ICD-10-CM

## 2015-04-30 DIAGNOSIS — J22 Unspecified acute lower respiratory infection: Secondary | ICD-10-CM

## 2015-04-30 MED ORDER — AZITHROMYCIN 250 MG PO TABS: ORAL_TABLET | ORAL | Status: DC

## 2015-04-30 MED ORDER — BENZONATATE 100 MG PO CAPS: 200.0000 mg | ORAL_CAPSULE | Freq: Two times a day (BID) | ORAL | Status: DC | PRN

## 2015-04-30 MED ORDER — HYDROCOD POLST-CPM POLST ER 10-8 MG/5ML PO SUER: 5.0000 mL | Freq: Every evening | ORAL | Status: DC | PRN

## 2015-04-30 MED ORDER — ALBUTEROL SULFATE HFA 108 (90 BASE) MCG/ACT IN AERS: 2.0000 | INHALATION_SPRAY | Freq: Four times a day (QID) | RESPIRATORY_TRACT | Status: DC | PRN

## 2015-04-30 NOTE — Telephone Encounter (Signed)
REFILL REQUEST   DR> SHAW.  amphetamine-dextroamphetamine (ADDERALL XR) 10 MG 24 hr capsule   [phone]

## 2015-04-30 NOTE — Progress Notes (Signed)
Chief Complaint:  Chief Complaint  Patient presents with  . Chills    x saturday  . Generalized Body Aches    x saturday  . Shortness of Breath    x saturday  . Fever    x saturday  . chest congestion    HPI: Jose Shaffer is a 45 y.o. male who reports to [facility] today complaining of chills, fevers, cody aches, feels fatogued. He ahs had 5 days  He is ok if he is still, no dry cough. He is fuzzy head, general weakness.   Past Medical History  Diagnosis Date  . Allergy   . ADD (attention deficit disorder)   . Insomnia   . Anxiety state, unspecified 03/10/2013  . Diverticulitis   . Sleep apnea    Past Surgical History  Procedure Laterality Date  . Quadriceps tendon repair  08/2008  . Inguinal hernia repair Bilateral 01/22/2015    Procedure: LAPAROSCOPIC BILATERAL INGUINAL HERNIA REPAIR WITH MESH AND UMBILICAL HERNIA REPAIR;  Surgeon: Abigail Miyamoto, MD;  Location: [facility];  Service: General;  Laterality: Bilateral;  . Umbilical hernia repair N/A 01/22/2015    Procedure: UMBILICAL HERNIA REPAIR;  Surgeon: Abigail Miyamoto, MD;  Location: [facility];  Service: General;  Laterality: N/A;   Social History   Social History  . Marital Status: Married    Spouse Name: Tristan Schroeder  . Number of Children: 0  . Years of Education: 12+   Occupational History  . truck driver   . contractor    Social History Main Topics  . Smoking status: Never Smoker   . Smokeless tobacco: Never Used  . Alcohol Use: No  . Drug Use: No  . Sexual Activity:    Partners: Female    Copy: None   Other Topics Concern  . None   Social History Narrative   Lives with wife, Tristan Schroeder   Caffeine 1 cup avg twice weekly   Family History  Problem Relation Age of Onset  . Hypertension Father   . Cancer Maternal Grandmother 45    breast cancer  . Hypertension Paternal Grandmother   . COPD Paternal Grandfather     lung cancer; tobacco use,  Doctor, general practice  . Cancer Maternal Aunt     breast cancer, late 38's  . Cancer Maternal Aunt     breast cancer; diagnosed late 50's   Allergies  Allergen Reactions  . Ibuprofen Other (See Comments)    Per patient feels like KNOT in throat and itching with rash  . Tramadol Anxiety   Prior to Admission medications   Medication Sig Start Date End Date Taking? Authorizing Provider  amphetamine-dextroamphetamine (ADDERALL XR) 10 MG 24 hr capsule Use 1-2 tablets by mouth up to twice a day as instructed by physician for ADD 01/18/15  Yes Sherren Mocha, MD  amphetamine-dextroamphetamine (ADDERALL XR) 10 MG 24 hr capsule Use 1-2 tablets by mouth up to twice a day as instructed by physician for ADD 01/18/15  Yes Sherren Mocha, MD  amphetamine-dextroamphetamine (ADDERALL XR) 10 MG 24 hr capsule Use 1-2 tablets by mouth up to twice a day as instructed by physician for ADD 01/18/15  Yes Sherren Mocha, MD  clonazePAM (KLONOPIN) 1 MG tablet Take 1-2 tablets (1-2 mg total) by mouth at bedtime. 01/18/15 07/14/15 Yes Sherren Mocha, MD  HYDROcodone-acetaminophen (NORCO) 5-325 MG tablet Take 1-2 tablets by mouth every 4 (four) hours as needed. Patient not  taking: Reported on 04/30/2015 01/22/15   Abigail Miyamoto, MD     ROS: The patient denies  night sweats, unintentional weight loss, chest pain, palpitations, dyspnea on exertion, nausea, vomiting, abdominal pain, dysuria, hematuria, melena, numbness, weakness, or tingling.   All other systems have been reviewed and were otherwise negative with the exception of those mentioned in the HPI and as above.    PHYSICAL EXAM: Filed Vitals:   04/30/15 1438  BP: 141/87  Pulse: 91  Temp: 98.8 F (37.1 C)  Resp: 16   SpO2 Readings from Last 3 Encounters:  04/30/15 98%  01/22/15 97%  01/18/15 98%    Body mass index is 33.68 kg/(m^2).   General: Alert, no acute distress HEENT:  Normocephalic, atraumatic, oropharynx patent. EOMI, PERRLA Erythematous throat, no  exudates, TM normal, + sinus tenderness, + erythematous/boggy nasal mucosa Cardiovascular:  Regular rate and rhythm, no rubs murmurs or gallops.  No Carotid bruits, radial pulse intact. No pedal edema.  Respiratory: Clear to auscultation bilaterally.  No wheezes, rales, or rhonchi.  No cyanosis, no use of accessory musculature Abdominal: No organomegaly, abdomen is soft and non-tender, positive bowel sounds. No masses. Skin: No rashes. Neurologic: Facial musculature symmetric. Psychiatric: Patient acts appropriately throughout our interaction. Lymphatic: No cervical or submandibular lymphadenopathy Musculoskeletal: Gait intact. No edema, tenderness   LABS: Results for orders placed or performed in visit on 11/12/14  Basic metabolic panel  Result Value Ref Range   Sodium 133 (L) 135 - 146 mmol/L   Potassium 4.3 3.5 - 5.3 mmol/L   Chloride 96 (L) 98 - 110 mmol/L   CO2 24 20 - 31 mmol/L   Glucose, Bld 69 65 - 99 mg/dL   BUN 15 7 - 25 mg/dL   Creat 9.60 4.54 - 0.98 mg/dL   Calcium 11.9 8.6 - 14.7 mg/dL  POCT CBC  Result Value Ref Range   WBC 16.6 (A) 4.6 - 10.2 K/uL   Lymph, poc 2.2 0.6 - 3.4   POC LYMPH PERCENT 13.3 10 - 50 %L   MID (cbc) 0.8 0 - 0.9   POC MID % 4.8 0 - 12 %M   POC Granulocyte 13.6 (A) 2 - 6.9   Granulocyte percent 81.9 (A) 37 - 80 %G   RBC 6.45 (A) 4.69 - 6.13 M/uL   Hemoglobin 18.1 14.1 - 18.1 g/dL   HCT, POC 82.9 (A) 56.2 - 53.7 %   MCV 85.8 80 - 97 fL   MCH, POC 28.0 27 - 31.2 pg   MCHC 32.6 31.8 - 35.4 g/dL   RDW, POC 13.0 %   Platelet Count, POC 282 142 - 424 K/uL   MPV 8.6 0 - 99.8 fL     EKG/XRAY:   Primary read interpreted by Dr. Conley Rolls at [facility].   ASSESSMENT/PLAN: Encounter Diagnoses  Name Primary?  . Cough Yes  . Lower respiratory infection (e.g., bronchitis, pneumonia, pneumonitis, pulmonitis)   . Acute bronchitis, unspecified organism    Azithromycin, tessalon perles, hycodan Albuterol prn  Fu prn   Gross sideeffects, risk and benefits,  and alternatives of medications d/w patient. Patient is aware that all medications have potential sideeffects and we are unable to predict every sideeffect or drug-drug interaction that may occur.  Bijal Siglin DO  04/30/2015 4:09 PM

## 2015-04-30 NOTE — Patient Instructions (Addendum)
Because you received an x-ray today, you will receive an invoice from [facility]. Please contact [facility] at [phone] with questions or concerns regarding your invoice. Our billing staff will not be able to assist you with those questions. Acute Bronchitis Bronchitis is inflammation of the airways that extend from the windpipe into the lungs (bronchi). The inflammation often causes mucus to develop. This leads to a cough, which is the most common symptom of bronchitis.  In acute bronchitis, the condition usually develops suddenly and goes away over time, usually in a couple weeks. Smoking, allergies, and asthma can make bronchitis worse. Repeated episodes of bronchitis may cause further lung problems.  CAUSES Acute bronchitis is most often caused by the same virus that causes a cold. The virus can spread from person to person (contagious) through coughing, sneezing, and touching contaminated objects. SIGNS AND SYMPTOMS   Cough.   Fever.   Coughing up mucus.   Body aches.   Chest congestion.   Chills.   Shortness of breath.   Sore throat.  DIAGNOSIS  Acute bronchitis is usually diagnosed through a physical exam. Your health care provider will also ask you questions about your medical history. Tests, such as chest X-rays, are sometimes done to rule out other conditions.  TREATMENT  Acute bronchitis usually goes away in a couple weeks. Oftentimes, no medical treatment is necessary. Medicines are sometimes given for relief of fever or cough. Antibiotic medicines are usually not needed but may be prescribed in certain situations. In some cases, an inhaler may be recommended to help reduce shortness of breath and control the cough. A cool mist vaporizer may also be used to help thin bronchial secretions and make it easier to clear the chest.  HOME CARE INSTRUCTIONS  Get plenty of rest.   Drink enough fluids to keep your urine clear or pale yellow (unless  you have a medical condition that requires fluid restriction). Increasing fluids may help thin your respiratory secretions (sputum) and reduce chest congestion, and it will prevent dehydration.   Take medicines only as directed by your health care provider.  If you were prescribed an antibiotic medicine, finish it all even if you start to feel better.  Avoid smoking and secondhand smoke. Exposure to cigarette smoke or irritating chemicals will make bronchitis worse. If you are a smoker, consider using nicotine gum or skin patches to help control withdrawal symptoms. Quitting smoking will help your lungs heal faster.   Reduce the chances of another bout of acute bronchitis by washing your hands frequently, avoiding people with cold symptoms, and trying not to touch your hands to your mouth, nose, or eyes.   Keep all follow-up visits as directed by your health care provider.  SEEK MEDICAL CARE IF: Your symptoms do not improve after 1 week of treatment.  SEEK IMMEDIATE MEDICAL CARE IF:  You develop an increased fever or chills.   You have chest pain.   You have severe shortness of breath.  You have bloody sputum.   You develop dehydration.  You faint or repeatedly feel like you are going to pass out.  You develop repeated vomiting.  You develop a severe headache. MAKE SURE YOU:   Understand these instructions.  Will watch your condition.  Will get help right away if you are not doing well or get worse.   This information is not intended to replace advice given to you by your health care provider. Make sure you discuss any questions you have with your   health care provider.   Document Released: 04/07/2004 Document Revised: 03/21/2014 Document Reviewed: 08/21/2012 Elsevier Interactive Patient Education 2016 Elsevier Inc.  

## 2015-05-02 NOTE — Telephone Encounter (Signed)
Pt is requesting a refill of: amphetamine-dextroamphetamine (ADDERALL XR) 10 MG 24 hr capsule

## 2015-05-04 MED ORDER — AMPHETAMINE-DEXTROAMPHET ER 10 MG PO CP24: ORAL_CAPSULE | ORAL | Status: DC

## 2015-05-04 NOTE — Telephone Encounter (Signed)
Printed and signed for pt pick-up 

## 2015-05-04 NOTE — Telephone Encounter (Signed)
Notified pt ready. 

## 2015-07-09 ENCOUNTER — Encounter: Payer: Self-pay | Admitting: Family Medicine

## 2015-07-09 ENCOUNTER — Ambulatory Visit (INDEPENDENT_AMBULATORY_CARE_PROVIDER_SITE_OTHER): Payer: BLUE CROSS/BLUE SHIELD | Admitting: Family Medicine

## 2015-07-09 VITALS — BP 128/86 | HR 96 | Temp 98.6°F | Resp 16 | Ht 71.0 in | Wt 240.0 lb

## 2015-07-09 DIAGNOSIS — F988 Other specified behavioral and emotional disorders with onset usually occurring in childhood and adolescence: Secondary | ICD-10-CM

## 2015-07-09 DIAGNOSIS — J019 Acute sinusitis, unspecified: Secondary | ICD-10-CM

## 2015-07-09 DIAGNOSIS — F909 Attention-deficit hyperactivity disorder, unspecified type: Secondary | ICD-10-CM | POA: Diagnosis not present

## 2015-07-09 MED ORDER — AMOXICILLIN-POT CLAVULANATE 875-125 MG PO TABS: 1.0000 | ORAL_TABLET | Freq: Two times a day (BID) | ORAL | Status: DC

## 2015-07-09 MED ORDER — CLONAZEPAM 1 MG PO TABS: 1.0000 mg | ORAL_TABLET | Freq: Every day | ORAL | Status: DC

## 2015-07-09 MED ORDER — PREDNISONE 20 MG PO TABS: ORAL_TABLET | ORAL | Status: DC

## 2015-07-09 MED ORDER — METHYLPHENIDATE HCL ER (OSM) 18 MG PO TBCR: EXTENDED_RELEASE_TABLET | ORAL | Status: DC

## 2015-07-09 NOTE — Patient Instructions (Addendum)
   IF you received an x-ray today, you will receive an invoice from [facility]. Please contact [facility] at [phone] with questions or concerns regarding your invoice.   IF you received labwork today, you will receive an invoice from [facility]. Please contact [facility] at [phone] with questions or concerns regarding your invoice.   Our billing staff will not be able to assist you with questions regarding bills from these companies.  You will be contacted with the lab results as soon as they are available. The fastest way to get your results is to activate your My Chart account. Instructions are located on the last page of this paperwork. If you have not heard from us regarding the results in 2 weeks, please contact this office.     Sinusitis, Adult Sinusitis is redness, soreness, and inflammation of the paranasal sinuses. Paranasal sinuses are air pockets within the bones of your face. They are located beneath your eyes, in the middle of your forehead, and above your eyes. In healthy paranasal sinuses, mucus is able to drain out, and air is able to circulate through them by way of your nose. However, when your paranasal sinuses are inflamed, mucus and air can become trapped. This can allow bacteria and other germs to grow and cause infection. Sinusitis can develop quickly and last only a short time (acute) or continue over a long period (chronic). Sinusitis that lasts for more than 12 weeks is considered chronic. CAUSES Causes of sinusitis include:  Allergies.  Structural abnormalities, such as displacement of the cartilage that separates your nostrils (deviated septum), which can decrease the air flow through your nose and sinuses and affect sinus drainage.  Functional abnormalities, such as when the small hairs (cilia) that line your sinuses and help remove mucus do not work properly or are not present. SIGNS AND SYMPTOMS Symptoms  of acute and chronic sinusitis are the same. The primary symptoms are pain and pressure around the affected sinuses. Other symptoms include:  Upper toothache.  Earache.  Headache.  Bad breath.  Decreased sense of smell and taste.  A cough, which worsens when you are lying flat.  Fatigue.  Fever.  Thick drainage from your nose, which often is green and may contain pus (purulent).  Swelling and warmth over the affected sinuses. DIAGNOSIS Your health care provider will perform a physical exam. During your exam, your health care provider may perform any of the following to help determine if you have acute sinusitis or chronic sinusitis:  Look in your nose for signs of abnormal growths in your nostrils (nasal polyps).  Tap over the affected sinus to check for signs of infection.  View the inside of your sinuses using an imaging device that has a light attached (endoscope). If your health care provider suspects that you have chronic sinusitis, one or more of the following tests may be recommended:  Allergy tests.  Nasal culture. A sample of mucus is taken from your nose, sent to a lab, and screened for bacteria.  Nasal cytology. A sample of mucus is taken from your nose and examined by your health care provider to determine if your sinusitis is related to an allergy. TREATMENT Most cases of acute sinusitis are related to a viral infection and will resolve on their own within 10 days. Sometimes, medicines are prescribed to help relieve symptoms of both acute and chronic sinusitis. These may include pain medicines, decongestants, nasal steroid sprays, or saline sprays. However, for sinusitis related   to a bacterial infection, your health care provider will prescribe antibiotic medicines. These are medicines that will help kill the bacteria causing the infection. Rarely, sinusitis is caused by a fungal infection. In these cases, your health care provider will prescribe antifungal  medicine. For some cases of chronic sinusitis, surgery is needed. Generally, these are cases in which sinusitis recurs more than 3 times per year, despite other treatments. HOME CARE INSTRUCTIONS  Drink plenty of water. Water helps thin the mucus so your sinuses can drain more easily.  Use a humidifier.  Inhale steam 3-4 times a day (for example, sit in the bathroom with the shower running).  Apply a warm, moist washcloth to your face 3-4 times a day, or as directed by your health care provider.  Use saline nasal sprays to help moisten and clean your sinuses.  Take medicines only as directed by your health care provider.  If you were prescribed either an antibiotic or antifungal medicine, finish it all even if you start to feel better. SEEK IMMEDIATE MEDICAL CARE IF:  You have increasing pain or severe headaches.  You have nausea, vomiting, or drowsiness.  You have swelling around your face.  You have vision problems.  You have a stiff neck.  You have difficulty breathing.   This information is not intended to replace advice given to you by your health care provider. Make sure you discuss any questions you have with your health care provider.   Document Released: 02/28/2005 Document Revised: 03/21/2014 Document Reviewed: 03/15/2011 Elsevier Interactive Patient Education 2016 Elsevier Inc.  

## 2015-07-09 NOTE — Progress Notes (Signed)
   Subjective:    Patient ID: Jose AllegraChristopher Shaffer, male    DOB: Feb 20, 1971, 45 y.o.   MRN: [id number]  HPI  He is now taking adderall with morning and lunch and he is starting to notice some more lethargy with the adderall and so he was wondering about trying to switch to Concerta - we tried concerta 54 mg last yr and he had side effects  cpap klonpin well Review of Systems     Objective:   Physical Exam        Assessment & Plan:

## 2015-08-06 ENCOUNTER — Telehealth: Payer: Self-pay

## 2015-08-06 NOTE — Telephone Encounter (Signed)
Pt is needing to talk with someone about going back on adderall not concerta   Best number [phone]

## 2015-08-07 NOTE — Telephone Encounter (Signed)
Dr. Clelia CroftShaw,  Pt would like to know if he can go back the Adderall instead being on the Concerta. Per patient Adderall seems to be better. Please advise   Thanks   Jose Shaffer

## 2015-08-08 NOTE — Telephone Encounter (Signed)
That is fine.  I will be happy to refill 3 mos of his prior adderall rx. However, I will not be in until Tuesday at 8 am so he can pick up then.  Rx is for  Adderall XR 10mg   Take 1-2 tabs po bid prn for ADD Disp 60. Thanks. Carley HammedEva

## 2015-08-11 NOTE — Telephone Encounter (Signed)
Patient is calling to follow up on refill request. I told patient there's a message saying that it's been approved but I didn't see in Epic that it's been printed

## 2015-08-12 ENCOUNTER — Other Ambulatory Visit: Payer: Self-pay | Admitting: Family Medicine

## 2015-08-12 MED ORDER — AMPHETAMINE-DEXTROAMPHET ER 10 MG PO CP24: 10.0000 mg | ORAL_CAPSULE | Freq: Every day | ORAL | Status: DC

## 2015-08-12 MED ORDER — AMPHETAMINE-DEXTROAMPHET ER 10 MG PO CP24: 10.0000 mg | ORAL_CAPSULE | Freq: Two times a day (BID) | ORAL | Status: DC | PRN

## 2015-08-12 MED ORDER — AMPHETAMINE-DEXTROAMPHET ER 10 MG PO CP24: 10.0000 mg | ORAL_CAPSULE | Freq: Two times a day (BID) | ORAL | Status: DC

## 2015-08-12 NOTE — Telephone Encounter (Signed)
Pt aware via phone that rx is ready for pick up

## 2015-08-12 NOTE — Telephone Encounter (Signed)
rx printed and ready for pt pick up

## 2015-08-12 NOTE — Telephone Encounter (Signed)
Spoke with patient to pick up [facility].

## 2015-08-15 ENCOUNTER — Other Ambulatory Visit: Payer: Self-pay | Admitting: Family Medicine

## 2015-12-16 ENCOUNTER — Encounter: Payer: Self-pay | Admitting: Family Medicine

## 2015-12-16 ENCOUNTER — Ambulatory Visit (INDEPENDENT_AMBULATORY_CARE_PROVIDER_SITE_OTHER): Payer: BLUE CROSS/BLUE SHIELD | Admitting: Family Medicine

## 2015-12-16 VITALS — BP 122/86 | HR 81 | Temp 97.6°F | Resp 18 | Ht 71.0 in | Wt 245.0 lb

## 2015-12-16 DIAGNOSIS — M25571 Pain in right ankle and joints of right foot: Secondary | ICD-10-CM | POA: Diagnosis not present

## 2015-12-16 DIAGNOSIS — Z23 Encounter for immunization: Secondary | ICD-10-CM

## 2015-12-16 DIAGNOSIS — F411 Generalized anxiety disorder: Secondary | ICD-10-CM | POA: Diagnosis not present

## 2015-12-16 DIAGNOSIS — F909 Attention-deficit hyperactivity disorder, unspecified type: Secondary | ICD-10-CM

## 2015-12-16 MED ORDER — AMPHETAMINE-DEXTROAMPHET ER 10 MG PO CP24: 10.0000 mg | ORAL_CAPSULE | Freq: Two times a day (BID) | ORAL | 0 refills | Status: DC | PRN

## 2015-12-16 MED ORDER — CLONAZEPAM 1 MG PO TABS: 1.0000 mg | ORAL_TABLET | Freq: Every day | ORAL | 5 refills | Status: DC

## 2015-12-16 NOTE — Progress Notes (Signed)
Subjective:  This chart was scribed for [name] MD, by Veverly Fells, at [facility].  This patient was seen in room 11 and the patient's care was started at 8:18 AM.   Chief Complaint  Patient presents with  . Medication Refill    Adderall Klonopin     Patient ID: Jose Shaffer, male    DOB: 05/18/1970, 45 y.o.   MRN: [id number]  HPI HPI Comments: Jose Shaffer is a 45 y.o. male who presents to the [facility] for a refill on his medications (Adderall and Klonopin.)  He has takes 1 Adderall tablet in the morning and a second one during lunch time along with a Klonopin.  He then takes another Klonopin at night time.  Patient feels that the schedule which he takes his mediation has been helping him with his anxiety.  Patient would like to have a flu shot today.   Right ankle discomfort:  Patient sprained his ankle 6 times over the span of 2 years when he was in highschool.  He now feels "less flexible" when he plants his foot and feels like something is "floating" posterior to the malleolus.  The discomfort comes on suddenly even if he has been sitting down and resting for a long period of time.    Patients daughter will be having a baby in two weeks.     Past Medical History:  Diagnosis Date  . ADD (attention deficit disorder)   . Allergy   . Anxiety state, unspecified 03/10/2013  . Diverticulitis   . Insomnia   . Sleep apnea     Current Outpatient Prescriptions on File Prior to Visit  Medication Sig Dispense Refill  . amphetamine-dextroamphetamine (ADDERALL XR) 10 MG 24 hr capsule Take 1-2 capsules (10-20 mg total) by mouth 2 (two) times daily as needed (ADD). 60 capsule 0  . clonazePAM (KLONOPIN) 1 MG tablet Take 1-2 tablets (1-2 mg total) by mouth at bedtime. 60 tablet 5  . amphetamine-dextroamphetamine (ADDERALL XR) 10 MG 24 hr capsule Take 1-2 capsules (10-20 mg total) by mouth 2 (two) times daily as needed (ADD).  (Patient not taking: Reported on 12/16/2015) 60 capsule 0  . amphetamine-dextroamphetamine (ADDERALL XR) 10 MG 24 hr capsule Take 1-2 capsules (10-20 mg total) by mouth 2 (two) times daily as needed (ADD). (Patient not taking: Reported on 12/16/2015) 60 capsule 0  . amphetamine-dextroamphetamine (ADDERALL XR) 10 MG 24 hr capsule Take 1-2 capsules (10-20 mg total) by mouth 2 (two) times daily as needed (ADD). (Patient not taking: Reported on 12/16/2015) 60 capsule 0  . methylphenidate (CONCERTA) 18 MG PO CR tablet 1 tab by mouth twice a day as directed (Patient not taking: Reported on 12/16/2015) 60 tablet 0   No current facility-administered medications on file prior to visit.     Allergies  Allergen Reactions  . Ibuprofen Other (See Comments)    Per patient feels like KNOT in throat and itching with rash  . Tramadol Anxiety      Review of Systems  Constitutional: Negative for chills and fever.  Eyes: Negative for pain and redness.  Respiratory: Negative for cough, choking and shortness of breath.   Gastrointestinal: Negative for nausea and vomiting.  Musculoskeletal: Negative for neck pain and neck stiffness.  Neurological: Negative for syncope and speech difficulty.       Objective:   Physical Exam  Constitutional: He is oriented to person, place, and time. He appears well-developed and well-nourished. No  distress.  HENT:  Head: Normocephalic and atraumatic.  Eyes: Conjunctivae and EOM are normal.  Neck: No thyromegaly present.  Cardiovascular: Normal rate and regular rhythm.  Exam reveals no friction rub.   No murmur heard. Pulmonary/Chest: Effort normal and breath sounds normal. No respiratory distress. He has no wheezes. He has no rales.  Musculoskeletal: Normal range of motion.  Discomfort posterior to the malleolus on the right foot.   Neurological: He is alert and oriented to person, place, and time.  Skin: Skin is warm and dry.  Psychiatric: He has a normal mood and  affect. His behavior is normal.  Nursing note and vitals reviewed.     Vitals:   12/16/15 0805  BP: 122/86  Pulse: 81  Resp: 18  Temp: 97.6 F (36.4 C)  TempSrc: Oral  SpO2: 97%  Weight: 245 lb (111.1 kg)  Height: 5\' 11"  (1.803 m)          Assessment & Plan:  Okay to call in for refills when needed in 3-4 mos. Follow up in 6 months.   1. Anxiety state   2. Attention deficit hyperactivity disorder (ADHD), unspecified ADHD type   3. Pain in joint of right ankle   4. Need for prophylactic vaccination and inoculation against influenza   Doing well, refilled meds  Orders Placed This Encounter  Procedures  . Flu Vaccine QUAD 36+ mos IM    Meds ordered this encounter  Medications  . amphetamine-dextroamphetamine (ADDERALL XR) 10 MG 24 hr capsule    Sig: Take 1-2 capsules (10-20 mg total) by mouth 2 (two) times daily as needed (ADD).    Dispense:  60 capsule    Refill:  0  . amphetamine-dextroamphetamine (ADDERALL XR) 10 MG 24 hr capsule    Sig: Take 1-2 capsules (10-20 mg total) by mouth 2 (two) times daily as needed (ADD).    Dispense:  60 capsule    Refill:  0    May fill 20 days from date written  . amphetamine-dextroamphetamine (ADDERALL XR) 10 MG 24 hr capsule    Sig: Take 1-2 capsules (10-20 mg total) by mouth 2 (two) times daily as needed (ADD).    Dispense:  60 capsule    Refill:  0    May fill on or after 40 days from date written  . amphetamine-dextroamphetamine (ADDERALL XR) 10 MG 24 hr capsule    Sig: Take 1-2 capsules (10-20 mg total) by mouth 2 (two) times daily as needed (ADD).    Dispense:  60 capsule    Refill:  0    May fill on or after 60 days from date written  . clonazePAM (KLONOPIN) 1 MG tablet    Sig: Take 1-2 tablets (1-2 mg total) by mouth at bedtime.    Dispense:  60 tablet    Refill:  5    I personally performed the services described in this documentation, which was scribed in my presence. The recorded information has been reviewed  and considered, and addended by me as needed.   [name], M.D.  [facility] [address] [place], [place] [address] [phone] phone [phone] fax  12/17/15 11:48 PM

## 2015-12-16 NOTE — Patient Instructions (Signed)
     IF you received an x-ray today, you will receive an invoice from [facility]. Please contact [facility] at [phone] with questions or concerns regarding your invoice.   IF you received labwork today, you will receive an invoice from [facility]. Please contact [facility] at [phone] with questions or concerns regarding your invoice.   Our billing staff will not be able to assist you with questions regarding bills from these companies.  You will be contacted with the lab results as soon as they are available. The fastest way to get your results is to activate your My Chart account. Instructions are located on the last page of this paperwork. If you have not heard from us regarding the results in 2 weeks, please contact this office.      

## 2016-03-01 ENCOUNTER — Ambulatory Visit (INDEPENDENT_AMBULATORY_CARE_PROVIDER_SITE_OTHER): Payer: BLUE CROSS/BLUE SHIELD | Admitting: Physician Assistant

## 2016-03-01 VITALS — BP 124/82 | HR 99 | Temp 98.2°F | Resp 17 | Ht 71.0 in | Wt 238.0 lb

## 2016-03-01 DIAGNOSIS — S61011A Laceration without foreign body of right thumb without damage to nail, initial encounter: Secondary | ICD-10-CM

## 2016-03-01 NOTE — Progress Notes (Signed)
   03/01/2016 4:07 PM   DOB: [birth year] / MRN: [id number]  SUBJECTIVE:  Jose Shaffer is a 45 y.o. male presenting for a saw injury to his right thumb that occurred today.  He glued the digit and continued to work.  He denies any difficulty with bleeding and states that it did not bleed at all.   He is allergic to ibuprofen and tramadol.   He  has a past medical history of ADD (attention deficit disorder); Allergy; Anxiety state, unspecified (03/10/2013); Diverticulitis; Insomnia; and Sleep apnea.    He  reports that he has never smoked. He has never used smokeless tobacco. He reports that he does not drink alcohol or use drugs. He  reports that he currently engages in sexual activity and has had male partners. He reports using the following method of birth control/protection: None. The patient  has a past surgical history that includes Quadriceps tendon repair (08/2008); Inguinal hernia repair (Bilateral, 01/22/2015); and Umbilical hernia repair (N/A, 01/22/2015).  His family history includes COPD in his paternal grandfather; Cancer in his maternal aunt and maternal aunt; Cancer (age of onset: 2173) in his maternal grandmother; Hypertension in his father and paternal grandmother.  Review of Systems  Constitutional: Negative for chills and fever.  Skin: Negative for itching.  Neurological: Negative for tingling, sensory change and focal weakness.    The problem list and medications were reviewed and updated by myself where necessary and exist elsewhere in the encounter.   OBJECTIVE:  BP 124/82 (BP Location: Right Arm, Patient Position: Sitting, Cuff Size: Large)   Pulse 99   Temp 98.2 F (36.8 C) (Oral)   Resp 17   Ht 5\' 11"  (1.803 m)   Wt 238 lb (108 kg)   SpO2 97%   BMI 33.19 kg/m   Physical Exam  Cardiovascular: Normal rate.   Pulmonary/Chest: Effort normal.  Musculoskeletal:       Hands:  Risk and benefits discussed and verbal consent obtained. Anesthetic allergies  reviewed. Patient anesthetized using 1:1 mix of 2% lidocaine without epi and Marcaine. The wound was cleansed thoroughly with soap and water. Sterile prep and drape. Wound closed with 3 throws using 5-0 dissolvable suture material. Hemostasis achieved. Mupirocin applied to the wound and bandage placed. The patient tolerated well. Wound instructions were provided.   No results found for this or any previous visit (from the past 72 hour(s)).  No results found.  ASSESSMENT AND PLAN  Jose Shaffer was seen today for finger injury.  Diagnoses and all orders for this visit:  Laceration of right thumb without foreign body without damage to nail, initial encounter Comments: Tissue pulled together given gaping that occurs with circular saws.  Good approximation.     The patient is advised to call or return to clinic if he does not see an improvement in symptoms, or to seek the care of the closest emergency department if he worsens with the above plan.   Deliah BostonMichael Clark, MHS, PA-C [facility] 03/01/2016 4:07 PM

## 2016-03-01 NOTE — Patient Instructions (Addendum)
WOUND CARE  . Keep area clean and dry for 24 hours. Do not remove bandage, if applied. . After 24 hours, remove bandage and wash wound gently with mild soap and warm water. Reapply a new bandage after cleaning wound, if directed. . Continue daily cleansing with soap and water until stitches/staples are removed. . Do not apply any ointments or creams to the wound while stitches/staples are in place, as this may cause delayed healing. . Notify the office if you experience any of the following signs of infection: Swelling, redness, pus drainage, streaking, fever >101.0 F . Notify the office if you experience excessive bleeding that does not stop after 15-20 minutes of constant, firm pressure.      IF you received an x-ray today, you will receive an invoice from [facility]. Please contact [facility] at [phone] with questions or concerns regarding your invoice.   IF you received labwork today, you will receive an invoice from [facility]. Please contact [facility] at [phone] with questions or concerns regarding your invoice.   Our billing staff will not be able to assist you with questions regarding bills from these companies.  You will be contacted with the lab results as soon as they are available. The fastest way to get your results is to activate your My Chart account. Instructions are located on the last page of this paperwork. If you have not heard from us regarding the results in 2 weeks, please contact this office.      

## 2016-04-25 ENCOUNTER — Telehealth: Payer: Self-pay

## 2016-04-25 NOTE — Telephone Encounter (Signed)
Pt sees dr. Clelia CroftShaw and would like a refill on his adderall medication

## 2016-04-25 NOTE — Telephone Encounter (Signed)
12/2015 last ov and refills x 4

## 2016-04-26 MED ORDER — AMPHETAMINE-DEXTROAMPHET ER 10 MG PO CP24: 10.0000 mg | ORAL_CAPSULE | Freq: Two times a day (BID) | ORAL | 0 refills | Status: DC | PRN

## 2016-04-26 NOTE — Telephone Encounter (Signed)
Refills ready for pick up. Sched OV for recheck in early May - 3 mos

## 2016-04-27 NOTE — Telephone Encounter (Signed)
Pt given message scripts ready for pick up

## 2016-06-24 ENCOUNTER — Other Ambulatory Visit: Payer: Self-pay | Admitting: Family Medicine

## 2016-06-24 DIAGNOSIS — F909 Attention-deficit hyperactivity disorder, unspecified type: Secondary | ICD-10-CM

## 2016-06-24 NOTE — Telephone Encounter (Signed)
Called to [facility] 

## 2016-07-14 ENCOUNTER — Ambulatory Visit (INDEPENDENT_AMBULATORY_CARE_PROVIDER_SITE_OTHER): Payer: BLUE CROSS/BLUE SHIELD | Admitting: Family Medicine

## 2016-07-14 ENCOUNTER — Encounter: Payer: Self-pay | Admitting: Family Medicine

## 2016-07-14 VITALS — BP 131/81 | HR 79 | Temp 98.2°F | Resp 16 | Ht 71.0 in | Wt 236.0 lb

## 2016-07-14 DIAGNOSIS — G4700 Insomnia, unspecified: Secondary | ICD-10-CM

## 2016-07-14 DIAGNOSIS — M25371 Other instability, right ankle: Secondary | ICD-10-CM | POA: Diagnosis not present

## 2016-07-14 DIAGNOSIS — F411 Generalized anxiety disorder: Secondary | ICD-10-CM | POA: Diagnosis not present

## 2016-07-14 DIAGNOSIS — M25571 Pain in right ankle and joints of right foot: Secondary | ICD-10-CM

## 2016-07-14 DIAGNOSIS — F902 Attention-deficit hyperactivity disorder, combined type: Secondary | ICD-10-CM

## 2016-07-14 MED ORDER — AMPHETAMINE-DEXTROAMPHET ER 10 MG PO CP24: 10.0000 mg | ORAL_CAPSULE | Freq: Two times a day (BID) | ORAL | 0 refills | Status: DC | PRN

## 2016-07-14 MED ORDER — TRAZODONE HCL 50 MG PO TABS: 50.0000 mg | ORAL_TABLET | Freq: Every evening | ORAL | 3 refills | Status: DC | PRN

## 2016-07-14 MED ORDER — CLONAZEPAM 1 MG PO TABS: 1.0000 mg | ORAL_TABLET | Freq: Three times a day (TID) | ORAL | 3 refills | Status: DC | PRN

## 2016-07-14 NOTE — Patient Instructions (Signed)
     IF you received an x-ray today, you will receive an invoice from [facility]. Please contact [facility] at [phone] with questions or concerns regarding your invoice.   IF you received labwork today, you will receive an invoice from [facility]. Please contact [facility] at [phone] with questions or concerns regarding your invoice.   Our billing staff will not be able to assist you with questions regarding bills from these companies.  You will be contacted with the lab results as soon as they are available. The fastest way to get your results is to activate your My Chart account. Instructions are located on the last page of this paperwork. If you have not heard from us regarding the results in 2 weeks, please contact this office.     

## 2016-07-14 NOTE — Progress Notes (Addendum)
Subjective:    Patient ID: Jose Shaffer, male    DOB: 1970-08-30, 46 y.o.   MRN: [id number] Chief Complaint  Patient presents with  . Medication Refill    ADDERALL and CLONAZEPAM  . ANKLE PROBLEMS    RIGHT - discuss problems from years ago    HPI  Jose Shaffer is a delightful 46 yo male here for a 6 mo f/u on his ADD/anxiety/insomnia with med refills.  ADD:  He takes his first XR and see how the day starts and some days he finds he takes the second about lunch.  If it is after 1 pm he won't take the second as he won't be able to sleep and then can't recover.  We did try him on other stimulants over the last sev yrs but concerta caused a sudden late day Drop off when it seems to quickly stop working and this makes him very irritable so he had to use the clonazepam in the mid afternoon to mask this and feels like he really functions best when he has the stimulant for the ADD so he can focus but the bzd so he doesn't worry and be irritable/angry. Also failed vyvanse and adderall IR.    Insomnia: Doesn't sleep as well when he has been to physically active or to busy - body just feels to tired to shut down.  He takes clonazepam 2mg  at night.  He can't take it if he has eaten as it won't be strong enough to work. Benadryl causes to much hangover but melatonin works somewhat.  Used zolpidem and GHB prior which both worked well.  Anxiety/Mood d/o: He wonders if he is having some anxiety as when he takes the clonazepam in the evening those few hours before bed are when he feels most clear-headed and productive (though admits he has always been a nighttime person and did his best work in the evenings). Did try taking 1 the klonopin during the day and it would help the irritability but then he would lack the ability to sleep at night as 1 klonopin does not make him tired. No desire to change bzd to shorter/quicker acting - likes how long this lasts but feels like he is on a low dose of 2mg  qhs and  requests increase.  Finds himself really snapping at his wife, loosing it w/ the people closest to him.  No EToH.  Sprained ankle at 46 yo playing basketball - put cast on and he sledded in it and it cracked open against a tree so he cut the cast off himself when sprain wasn't fully healed so since he has always noted  increased ROM w/ inversion and has twice gotten stuck at almost a 90 degree angle of inversion.  BUt now has developed "a very sharp pain between foot and tibia" (per pt but he points to lateral malleolus) and loosing dorsiflexion ROM. Has not ever been seen for this in the past 30 yrs. Dr. Fara Chute did his knee -  Has not seen any other ortho in town.  Past Medical History:  Diagnosis Date  . ADD (attention deficit disorder)   . Allergy   . Anxiety state, unspecified 03/10/2013  . Diverticulitis   . Insomnia   . Sleep apnea    Past Surgical History:  Procedure Laterality Date  . INGUINAL HERNIA REPAIR Bilateral 01/22/2015   Procedure: LAPAROSCOPIC BILATERAL INGUINAL HERNIA REPAIR WITH MESH AND UMBILICAL HERNIA REPAIR;  Surgeon: Abigail Miyamoto, MD;  Location: [facility];  Service: General;  Laterality: Bilateral;  . QUADRICEPS TENDON REPAIR  08/2008  . UMBILICAL HERNIA REPAIR N/A 01/22/2015   Procedure: UMBILICAL HERNIA REPAIR;  Surgeon: Abigail Miyamoto, MD;  Location: [facility];  Service: General;  Laterality: N/A;   No current outpatient prescriptions on file prior to visit.   No current facility-administered medications on file prior to visit.    Allergies  Allergen Reactions  . Ibuprofen Other (See Comments)    Per patient feels like KNOT in throat and itching with rash  . Tramadol Anxiety   Family History  Problem Relation Age of Onset  . Hypertension Father   . Cancer Maternal Grandmother 22    breast cancer  . Hypertension Paternal Grandmother   . COPD Paternal Grandfather     lung cancer; tobacco use, Doctor, general practice    . Cancer Maternal Aunt     breast cancer, late 35's  . Cancer Maternal Aunt     breast cancer; diagnosed late 69's   Social History   Social History  . Marital status: Married    Spouse name: Tristan Schroeder  . Number of children: 0  . Years of education: 12+   Occupational History  . truck Set designer  . contractor    Social History Main Topics  . Smoking status: Never Smoker  . Smokeless tobacco: Never Used  . Alcohol use No  . Drug use: No  . Sexual activity: Yes    Partners: Female    Birth control/ protection: None   Other Topics Concern  . None   Social History Narrative   Lives with wife, Tristan Schroeder   Caffeine 1 cup avg twice weekly   Depression screen [facility] 2/9 07/14/2016 03/01/2016 12/16/2015 07/09/2015 04/30/2015  Decreased Interest 0 0 0 0 0  Down, Depressed, Hopeless 0 0 0 0 0  PHQ - 2 Score 0 0 0 0 0    Review of Systems See hpi    Objective:   Physical Exam  Constitutional: He is oriented to person, place, and time. He appears well-developed and well-nourished. No distress.  HENT:  Head: Normocephalic and atraumatic.  Eyes: Conjunctivae are normal. Pupils are equal, round, and reactive to light. No scleral icterus.  Neck: Normal range of motion. Neck supple. No thyromegaly present.  Cardiovascular: Normal rate, regular rhythm, normal heart sounds and intact distal pulses.   Pulmonary/Chest: Effort normal and breath sounds normal. No respiratory distress.  Musculoskeletal: He exhibits no edema.  Lymphadenopathy:    He has no cervical adenopathy.  Neurological: He is alert and oriented to person, place, and time.  Skin: Skin is warm and dry. He is not diaphoretic.  Psychiatric: He has a normal mood and affect. His behavior is normal.      BP 131/81 (BP Location: Right Arm, Patient Position: Sitting, Cuff Size: Large)   Pulse 79   Temp 98.2 F (36.8 C) (Oral)   Resp 16   Ht 5\' 11"  (1.803 m)   Wt 236 lb (107 kg)   SpO2 97%   BMI 32.92 kg/m    Assessment & Plan:   1. Insomnia, unspecified type - Cont clonazepam 2mg  qhs on empty stomach - can't increase this dose as pt requests so try augmenting with prn trazodone. Discussed trial of belsomra (would have to stop klonopin) or clonidine qhs. Declines change to other bzd.  2. Attention deficit hyperactivity disorder (ADHD), combined type - current low dose stimulant working well for him - we  3. Anxiety state - Recommend routine lab work to r/o medical etiology of worsening symptoms - tsh, cbc. Pt declines today but agree to do at f/u.  Worsened on wellbutrin prior. Has done well on clonazepam qafternoon prior so ok to restart.  Consider Effexor as may also help ADD but pt reluctant. Consider ssri, buspar, or prn vistaril.  4. Right ankle instability - refer to ortho -30 yrs of slowly worsening instability that has now developed acute pain, [facility] issue prior  5. Pain in joint involving right ankle and foot    Pt refuses labs, very reluctant about blood draw.  He is taking a fair amount of supplements that he is quite confident I wouldn't approve of. These are in much lower doses than he did in years prior as are used in the "body building world" to increase anabolic steroids so he is quite confident his testosterone level is prob much higher than I would approve of. However, reviewed poss risks of testosterone supp with pt inc prostate ca, MI, CVA -discussed that we know there is this similar risk profile in women when using HRT past menopause indiscriminately - and we do not yet have a comparable study in men to prove or disprove risks but is likely to have some negative impact on cardiovascular and male cancer esp when using prolonged supraphysiologic levels. Pt aware and agrees but with no desire to change current regimen as so much less supplementation than prior and his body has adapted over all this time - feels confident he would have numerous  immed negative consequences in mood, energy, body habitus/ability if changed. However, I am worried this is exac/causing the irritability and anger he has gradually developed over the past few yrs but he is not at all open to changing reg.  Agrees to start monitoring for primary prevention - needs lipids, psa, cmp, cbc, tsh, ua at f/u - sched for CPE. F/u in 4 mos for med refills and recheck mood/insomnia sxs/med changes.  Meds ordered this encounter  Medications  . amphetamine-dextroamphetamine (ADDERALL XR) 10 MG 24 hr capsule    Sig: Take 1-2 capsules (10-20 mg total) by mouth 2 (two) times daily as needed (ADD).    Dispense:  60 capsule    Refill:  0    May fill on or after 40 days from date written  . amphetamine-dextroamphetamine (ADDERALL XR) 10 MG 24 hr capsule    Sig: Take 1-2 capsules (10-20 mg total) by mouth 2 (two) times daily as needed (ADD).    Dispense:  60 capsule    Refill:  0    May fill 20 days from date written  . amphetamine-dextroamphetamine (ADDERALL XR) 10 MG 24 hr capsule    Sig: Take 1-2 capsules (10-20 mg total) by mouth 2 (two) times daily as needed (ADD).    Dispense:  60 capsule    Refill:  0  . amphetamine-dextroamphetamine (ADDERALL XR) 10 MG 24 hr capsule    Sig: Take 1-2 capsules (10-20 mg total) by mouth 2 (two) times daily as needed (ADD).    Dispense:  60 capsule    Refill:  0    May fill on or after 60 days from date written  . clonazePAM (KLONOPIN) 1 MG tablet    Sig: Take 1 tablet (1 mg total) by mouth 3 (three) times daily as needed for anxiety.    Dispense:  90 tablet    Refill:  3  . traZODone (DESYREL) 50 MG tablet    Sig: Take 1-2 tablets (50-100 mg total) by mouth at bedtime as needed for sleep.    Dispense:  60 tablet    Refill:  3   Over 40 min spent in face-to-face evaluation of and consultation with patient and coordination of care.  Over 50% of this time was spent counseling this patient.    [name], M.D.  Primary Care at  [facility] [address] [place], [place] [address] [phone] phone [phone] fax  07/16/16 7:01 PM

## 2016-10-25 ENCOUNTER — Encounter: Payer: Self-pay | Admitting: Physician Assistant

## 2016-10-25 ENCOUNTER — Ambulatory Visit (INDEPENDENT_AMBULATORY_CARE_PROVIDER_SITE_OTHER): Payer: BLUE CROSS/BLUE SHIELD | Admitting: Physician Assistant

## 2016-10-25 VITALS — BP 132/86 | HR 91 | Temp 98.5°F | Resp 16 | Ht 70.5 in | Wt 238.4 lb

## 2016-10-25 DIAGNOSIS — H6123 Impacted cerumen, bilateral: Secondary | ICD-10-CM

## 2016-10-25 DIAGNOSIS — J0100 Acute maxillary sinusitis, unspecified: Secondary | ICD-10-CM

## 2016-10-25 MED ORDER — AMOXICILLIN-POT CLAVULANATE 875-125 MG PO TABS: 1.0000 | ORAL_TABLET | Freq: Two times a day (BID) | ORAL | 0 refills | Status: AC

## 2016-10-25 NOTE — Progress Notes (Signed)
MRN: [id number] DOB: Dec 14, 1970  Subjective:   Jose AllegraChristopher Shaffer is a 46 y.o. male presenting for chief complaint of sinus drainage (right sinuses hurt, teeth, eyes, x 3 days, this am "little green drainage") .  Reports 6 day history of sinus congestion, sinus pain, rhinorrhea, gum pain,and subjective fever. Has tried OTC dayquil, antihistamine, and flonase with no relief. Denies  ear pain, sore throat and cough, chills, nausea, vomiting, abdominal pain and diarrhea. Has had sick contact with niece. Has history of seasonal allergies. Has hx of sinus infections, notes his left sided nostril is chronically inflamed.  No history of asthma. Denies smoking. Denies any other aggravating or relieving factors, no other questions or concerns.  Jose Shaffer has a current medication list which includes the following prescription(s): amphetamine-dextroamphetamine, amphetamine-dextroamphetamine, amphetamine-dextroamphetamine, amphetamine-dextroamphetamine, clonazepam, and trazodone. Also is allergic to ibuprofen and tramadol.  Jose Shaffer  has a past medical history of ADD (attention deficit disorder); Allergy; Anxiety state, unspecified (03/10/2013); Diverticulitis; Insomnia; and Sleep apnea. Also  has a past surgical history that includes Quadriceps tendon repair (08/2008); Inguinal hernia repair (Bilateral, 01/22/2015); and Umbilical hernia repair (N/A, 01/22/2015).   Objective:   Vitals: BP 132/86   Pulse 91   Temp 98.5 F (36.9 C) (Oral)   Resp 16   Ht 5' 10.5" (1.791 m)   Wt 238 lb 6.4 oz (108.1 kg)   SpO2 98%   BMI 33.72 kg/m   Physical Exam  Constitutional: He is oriented to person, place, and time. He appears well-developed and well-nourished. No distress.  HENT:  Head: Normocephalic and atraumatic.  Right Ear: There is drainage (dark brown cerumen impaction blocking view of TM ).  Left Ear: There is drainage (dark brown cerumen impaction blocking view of TM ).  Nose: Mucosal edema  (moderate bilaterally) and rhinorrhea present. Right sinus exhibits maxillary sinus tenderness (moderate tenderness with palpation ). Right sinus exhibits no frontal sinus tenderness. Left sinus exhibits maxillary sinus tenderness ( moderate tenderness with palpation). Left sinus exhibits no frontal sinus tenderness.  Mouth/Throat: Uvula is midline, oropharynx is clear and moist and mucous membranes are normal. No dental caries.  No dental abscess palpated on upper gums bilaterally.    Eyes: Conjunctivae are normal.  Neck: Normal range of motion.  Cardiovascular: Normal rate, regular rhythm and normal heart sounds.   Pulmonary/Chest: Effort normal and breath sounds normal. He has no wheezes. He has no rales.  Lymphadenopathy:       Head (right side): No submental, no submandibular, no tonsillar, no preauricular, no posterior auricular and no occipital adenopathy present.       Head (left side): No submental, no submandibular, no tonsillar, no preauricular, no posterior auricular and no occipital adenopathy present.    He has no cervical adenopathy.       Right: No supraclavicular adenopathy present.       Left: No supraclavicular adenopathy present.  Neurological: He is alert and oriented to person, place, and time.  Skin: Skin is warm and dry.  Psychiatric: He has a normal mood and affect.  Vitals reviewed.   No results found for this or any previous visit (from the past 24 hour(s)).  Assessment and Plan :  1. Acute maxillary sinusitis, recurrence not specified Due to hx, duration, severity of symptoms, and PE findings, will cover for bacteria etiology at this time. Pt encouraged to return to clinic if symptoms worsen, do not improve in 7-10 days, or as needed - amoxicillin-clavulanate (AUGMENTIN) 875-125 MG tablet; Take 1  tablet by mouth 2 (two) times daily.  Dispense: 14 tablet; Refill: 0  2. Bilateral impacted cerumen Resolved - Ear wax removal  Benjiman Core, PA-C  Primary  Care at [facility] 10/25/2016 7:15 PM

## 2016-10-25 NOTE — Patient Instructions (Addendum)
Please take antibiotic as prescribed. I do recommend using neti pot in the future when you have nasal congestion and also OTC flonase. Please return to clinic if symptoms worsen, do not improve in 7-10 days, or as needed. Thank you for letting me participate in your health and well being.   Sinusitis, Adult Sinusitis is soreness and inflammation of your sinuses. Sinuses are hollow spaces in the bones around your face. They are located:  Around your eyes.  In the middle of your forehead.  Behind your nose.  In your cheekbones.  Your sinuses and nasal passages are lined with a stringy fluid (mucus). Mucus normally drains out of your sinuses. When your nasal tissues get inflamed or swollen, the mucus can get trapped or blocked so air cannot flow through your sinuses. This lets bacteria, viruses, and funguses grow, and that leads to infection. Follow these instructions at home: Medicines  Take, use, or apply over-the-counter and prescription medicines only as told by your doctor. These may include nasal sprays.  If you were prescribed an antibiotic medicine, take it as told by your doctor. Do not stop taking the antibiotic even if you start to feel better. Hydrate and Humidify  Drink enough water to keep your pee (urine) clear or pale yellow.  Use a cool mist humidifier to keep the humidity level in your home above 50%.  Breathe in steam for 10-15 minutes, 3-4 times a day or as told by your doctor. You can do this in the bathroom while a hot shower is running.  Try not to spend time in cool or dry air. Rest  Rest as much as possible.  Sleep with your head raised (elevated).  Make sure to get enough sleep each night. General instructions  Put a warm, moist washcloth on your face 3-4 times a day or as told by your doctor. This will help with discomfort.  Wash your hands often with soap and water. If there is no soap and water, use hand sanitizer.  Do not smoke. Avoid being around  people who are smoking (secondhand smoke).  Keep all follow-up visits as told by your doctor. This is important. Contact a doctor if:  You have a fever.  Your symptoms get worse.  Your symptoms do not get better within 10 days. Get help right away if:  You have a very bad headache.  You cannot stop throwing up (vomiting).  You have pain or swelling around your face or eyes.  You have trouble seeing.  You feel confused.  Your neck is stiff.  You have trouble breathing. This information is not intended to replace advice given to you by your health care provider. Make sure you discuss any questions you have with your health care provider. Document Released: 08/17/2007 Document Revised: 10/25/2015 Document Reviewed: 12/24/2014 Elsevier Interactive Patient Education  2018 [employer]    IF you received an x-ray today, you will receive an invoice from [facility]. Please contact [facility] at [phone] with questions or concerns regarding your invoice.   IF you received labwork today, you will receive an invoice from [facility]. Please contact [facility] at [phone] with questions or concerns regarding your invoice.   Our billing staff will not be able to assist you with questions regarding bills from these companies.  You will be contacted with the lab results as soon as they are available. The fastest way to get your results is to activate your My Chart account. Instructions are located on the last  page of this paperwork. If you have not heard from [place] regarding the results in 2 weeks, please contact this office.

## 2016-11-04 ENCOUNTER — Ambulatory Visit (INDEPENDENT_AMBULATORY_CARE_PROVIDER_SITE_OTHER): Payer: BLUE CROSS/BLUE SHIELD

## 2016-11-04 ENCOUNTER — Ambulatory Visit (INDEPENDENT_AMBULATORY_CARE_PROVIDER_SITE_OTHER): Payer: BLUE CROSS/BLUE SHIELD | Admitting: Physician Assistant

## 2016-11-04 ENCOUNTER — Encounter: Payer: Self-pay | Admitting: Physician Assistant

## 2016-11-04 VITALS — BP 132/73 | HR 97 | Temp 97.9°F | Resp 20 | Ht 70.75 in | Wt 236.2 lb

## 2016-11-04 DIAGNOSIS — J309 Allergic rhinitis, unspecified: Secondary | ICD-10-CM

## 2016-11-04 DIAGNOSIS — R059 Cough, unspecified: Secondary | ICD-10-CM

## 2016-11-04 DIAGNOSIS — R05 Cough: Secondary | ICD-10-CM

## 2016-11-04 LAB — POCT CBC
Granulocyte percent: 84.5 %G — AB (ref 37–80)
HEMATOCRIT: 51.3 % (ref 43.5–53.7)
HEMOGLOBIN: 17.1 g/dL (ref 14.1–18.1)
Lymph, poc: 2.2 (ref 0.6–3.4)
MCH: 28.3 pg (ref 27–31.2)
MCHC: 33.3 g/dL (ref 31.8–35.4)
MCV: 84.8 fL (ref 80–97)
MID (CBC): 0.8 (ref 0–0.9)
MPV: 8.4 fL (ref 0–99.8)
PLATELET COUNT, POC: 390 10*3/uL (ref 142–424)
POC Granulocyte: 16.2 — AB (ref 2–6.9)
POC LYMPH PERCENT: 11.4 %L (ref 10–50)
POC MID %: 4.1 %M (ref 0–12)
RBC: 6.04 M/uL (ref 4.69–6.13)
RDW, POC: 14.9 %
WBC: 19.2 10*3/uL — AB (ref 4.6–10.2)

## 2016-11-04 MED ORDER — DOXYCYCLINE HYCLATE 100 MG PO CAPS: 100.0000 mg | ORAL_CAPSULE | Freq: Two times a day (BID) | ORAL | 0 refills | Status: DC

## 2016-11-04 MED ORDER — CEFTRIAXONE SODIUM 1 G IJ SOLR
1.0000 g | Freq: Once | INTRAMUSCULAR | Status: AC
  Administered 2016-11-04: 1 g via INTRAMUSCULAR

## 2016-11-04 NOTE — Patient Instructions (Addendum)
We are going to treat you with antibiotics that cover atypical organisms for the lungs. You should start to feel improvement in 24-48 hours. I recommend also using your flonase and Claritin daily. Please return on Monday, 11/07/16, for reevaluation. If you develop any worsening symptoms, shortness of breath, fever, or chills while on this medication please seek care immediately at our clinic or the ED. Thank you for letting me participate in your health and well being.   IF you received an x-ray today, you will receive an invoice from [facility]. Please contact [facility] at [phone] with questions or concerns regarding your invoice.   IF you received labwork today, you will receive an invoice from [place]. Please contact [facility] at [phone] with questions or concerns regarding your invoice.   Our billing staff will not be able to assist you with questions regarding bills from these companies.  You will be contacted with the lab results as soon as they are available. The fastest way to get your results is to activate your My Chart account. Instructions are located on the last page of this paperwork. If you have not heard from [place] regarding the results in 2 weeks, please contact this office.

## 2016-11-04 NOTE — Progress Notes (Signed)
MRN: [id number] DOB: [birth year]  Subjective:   Jose Shaffer is a 46 y.o. male presenting for follow up on sinus issues. Pt initially seen on 10/25/16 for 6 days of sinus pressure, subjective fever, and rhinorrhea. He tried over-the-counter DayQuil, antihistamines, and Flonase with no relief. Has history of seasonal allergies and sinus infections. Exam revealed moderate maxillary sinus tenderness. Given Rx for augmentin. Please see 10/25/16 office note for further details. Today, patient reports he has completed prescription for Augmentin. He originally felt better. However, with 2 days of antibiotics he started having some minor sinus pressure and sneezing. 2 days ago he developed a productive cough. Has associated fatigue and subjective fever. Denies wheezing, shortness of breath, chest pain, ear pain, and sore throat. His most concerning symptom to him is his cough. He has continued trying his oral antihistamines. Denies smoking. Denies recent travel.  Review of Systems  Gastrointestinal: Negative for abdominal pain, diarrhea and nausea.  Genitourinary: Negative for dysuria and frequency.  Musculoskeletal: Negative for neck pain.  Neurological: Negative for dizziness and headaches.    Jose Shaffer has a current medication list which includes the following prescription(s): amphetamine-dextroamphetamine, amphetamine-dextroamphetamine, clonazepam, and trazodone. Also is allergic to ibuprofen and tramadol.  Jose Shaffer  has a past medical history of ADD (attention deficit disorder); Allergy; Anxiety state, unspecified (03/10/2013); Diverticulitis; Insomnia; and Sleep apnea. Also  has a past surgical history that includes Quadriceps tendon repair (08/2008); Inguinal hernia repair (Bilateral, 01/22/2015); and Umbilical hernia repair (N/A, 01/22/2015).   Objective:   Vitals: BP 132/73 (BP Location: Right Arm, Patient Position: Sitting, Cuff Size: Large)   Pulse 97   Temp 97.9 F (36.6 C)  (Oral)   Resp 20   Ht 5' 10.75" (1.797 m)   Wt 236 lb 3.2 oz (107.1 kg)   SpO2 95%   BMI 33.18 kg/m   Physical Exam  Constitutional: He is oriented to person, place, and time. He appears well-developed and well-nourished. No distress (sitting on exam table).  HENT:  Head: Normocephalic and atraumatic.  Right Ear: Tympanic membrane, external ear and ear canal normal.  Left Ear: Tympanic membrane, external ear and ear canal normal.  Nose: Mucosal edema (mild bilaterally) present. Right sinus exhibits no maxillary sinus tenderness and no frontal sinus tenderness. Left sinus exhibits no maxillary sinus tenderness and no frontal sinus tenderness.  Mouth/Throat: Uvula is midline, oropharynx is clear and moist and mucous membranes are normal. No tonsillar exudate.  Eyes: Conjunctivae are normal.  Neck: Normal range of motion.  Cardiovascular: Normal rate, regular rhythm and normal heart sounds.   Pulmonary/Chest: Effort normal. He has no decreased breath sounds. He has no wheezes. He has rhonchi in the right upper field. He has no rales.  Lymphadenopathy:       Head (right side): No submental, no submandibular, no tonsillar, no preauricular, no posterior auricular and no occipital adenopathy present.       Head (left side): No submental, no submandibular, no tonsillar, no preauricular, no posterior auricular and no occipital adenopathy present.    He has no cervical adenopathy.       Right: No supraclavicular adenopathy present.       Left: No supraclavicular adenopathy present.  Neurological: He is alert and oriented to person, place, and time.  Skin: Skin is warm. He is diaphoretic (mildly-moderately clammy to palpation ).  Psychiatric: He has a normal mood and affect.  Vitals reviewed.   Results for orders placed or performed in visit on 11/04/16 (from the  past 24 hour(s))  POCT CBC     Status: Abnormal   Collection Time: 11/04/16 12:11 PM  Result Value Ref Range   WBC 19.2 (A) 4.6 -  10.2 K/uL   Lymph, poc 2.2 0.6 - 3.4   POC LYMPH PERCENT 11.4 10 - 50 %L   MID (cbc) 0.8 0 - 0.9   POC MID % 4.1 0 - 12 %M   POC Granulocyte 16.2 (A) 2 - 6.9   Granulocyte percent 84.5 (A) 37 - 80 %G   RBC 6.04 4.69 - 6.13 M/uL   Hemoglobin 17.1 14.1 - 18.1 g/dL   HCT, POC 18.8 41.6 - 53.7 %   MCV 84.8 80 - 97 fL   MCH, POC 28.3 27 - 31.2 pg   MCHC 33.3 31.8 - 35.4 g/dL   RDW, POC 60.6 %   Platelet Count, POC 390 142 - 424 K/uL   MPV 8.4 0 - 99.8 fL    Dg Chest 2 View  Result Date: 11/04/2016 CLINICAL DATA:  46 year old male with productive cough for 2 days and intermittent fever. EXAM: CHEST  2 VIEW COMPARISON:  04/30/2015 FINDINGS: The heart size and mediastinal contours are within normal limits. Both lungs are clear. The visualized skeletal structures are unremarkable. IMPRESSION: No active cardiopulmonary disease. Electronically Signed   By: Sande Brothers M.D.   On: 11/04/2016 12:04    Assessment and Plan :  1. Cough Due to new onset productive cough with elevated WBC of 19.2 will treat empirically for underlying bacterial lung etiology despite normal CXR. Pt appears stable, he is mildly diaphoretic to palpation but otherwise appears well. Vitals are stable.  Will admin 1g of rocephin in office and treat with oral doxycycline to cover atypicals. Plan to follow up in 3 days for reevalution. Given strict return/ED precuations. Repeat POCT CBC at follow-up visit. - POCT CBC - DG Chest 2 View; Future - cefTRIAXone (ROCEPHIN) injection 1 g; Inject 1 g into the muscle once. - doxycycline (VIBRAMYCIN) 100 MG capsule; Take 1 capsule (100 mg total) by mouth 2 (two) times daily.  Dispense: 20 capsule; Refill: 0  2. Allergic rhinitis, unspecified seasonality, unspecified trigger Continue Flonase and Claritin daily.  Benjiman Core, PA-C  Primary Care at [facility] 11/06/2016 9:47 PM

## 2016-11-07 ENCOUNTER — Encounter: Payer: Self-pay | Admitting: Physician Assistant

## 2016-11-07 ENCOUNTER — Ambulatory Visit (INDEPENDENT_AMBULATORY_CARE_PROVIDER_SITE_OTHER): Payer: BLUE CROSS/BLUE SHIELD | Admitting: Physician Assistant

## 2016-11-07 VITALS — BP 127/78 | HR 82 | Temp 97.7°F | Resp 18 | Ht 70.75 in | Wt 235.8 lb

## 2016-11-07 DIAGNOSIS — D72829 Elevated white blood cell count, unspecified: Secondary | ICD-10-CM

## 2016-11-07 DIAGNOSIS — R05 Cough: Secondary | ICD-10-CM | POA: Diagnosis not present

## 2016-11-07 DIAGNOSIS — R059 Cough, unspecified: Secondary | ICD-10-CM

## 2016-11-07 LAB — POCT CBC
Granulocyte percent: 68.3 %G (ref 37–80)
HEMATOCRIT: 51.9 % (ref 43.5–53.7)
Hemoglobin: 17.4 g/dL (ref 14.1–18.1)
LYMPH, POC: 2.3 (ref 0.6–3.4)
MCH, POC: 28.9 pg (ref 27–31.2)
MCHC: 33.5 g/dL (ref 31.8–35.4)
MCV: 86.3 fL (ref 80–97)
MID (CBC): 0.4 (ref 0–0.9)
MPV: 8.7 fL (ref 0–99.8)
POC GRANULOCYTE: 5.8 (ref 2–6.9)
POC LYMPH %: 27.5 % (ref 10–50)
POC MID %: 4.2 %M (ref 0–12)
Platelet Count, POC: 397 10*3/uL (ref 142–424)
RBC: 6.02 M/uL (ref 4.69–6.13)
RDW, POC: 15.6 %
WBC: 8.5 10*3/uL (ref 4.6–10.2)

## 2016-11-07 NOTE — Progress Notes (Signed)
MRN: [id number] DOB: [birth year]  Subjective:   Jose Shaffer is a 46 y.o. male presenting for follow up on cough. Pt last seen in office on 11/04/16. He had developed a productive cough after being treated with Augmentin for a sinus infection. Rhonchi were auscultated in RUF on lung exam and pt was mildly diaphoretic to palpation. SpO2 of 95%. CXR was normal. WBC of 19.2. Tx with 1g ceftriaxone and 10 day course of oral doxycycline for underlying lung etiology. Instructed to return to clinic in 3 days for reevaluation. Today, pt notes he feels much better. His cough, chills, and fatigue have all improved. Still having intermittent cough. Notes he started feeling a difference about 24 hours after his last OV. He is still taking doxycycline as prescribed. Also, states the flonase is helping tremendously with his allergies. He denies SOB, chest pain, fever, night sweats, sore throat, and sinus pressure.   Jose Shaffer has a current medication list which includes the following prescription(s): amphetamine-dextroamphetamine, clonazepam, doxycycline, and trazodone. Also is allergic to ibuprofen and tramadol.  Jose Shaffer  has a past medical history of ADD (attention deficit disorder); Allergy; Anxiety state, unspecified (03/10/2013); Diverticulitis; Insomnia; and Sleep apnea. Also  has a past surgical history that includes Quadriceps tendon repair (08/2008); Inguinal hernia repair (Bilateral, 01/22/2015); and Umbilical hernia repair (N/A, 01/22/2015).   Objective:   Vitals: BP 127/78 (BP Location: Right Arm, Patient Position: Sitting, Cuff Size: Large)   Pulse 82   Temp 97.7 F (36.5 C) (Oral)   Resp 18   Ht 5' 10.75" (1.797 m)   Wt 235 lb 12.8 oz (107 kg)   SpO2 97%   BMI 33.12 kg/m   Physical Exam  Constitutional: He is oriented to person, place, and time. He appears well-developed and well-nourished. No distress.  HENT:  Head: Normocephalic and atraumatic.  Right Ear: Tympanic  membrane, external ear and ear canal normal.  Left Ear: Tympanic membrane, external ear and ear canal normal.  Nose: Mucosal edema (mild bilaterally) present. Right sinus exhibits no maxillary sinus tenderness and no frontal sinus tenderness. Left sinus exhibits no maxillary sinus tenderness and no frontal sinus tenderness.  Mouth/Throat: Uvula is midline, oropharynx is clear and moist and mucous membranes are normal. No tonsillar exudate.  Eyes: Conjunctivae are normal.  Neck: Normal range of motion.  Cardiovascular: Normal rate, regular rhythm and normal heart sounds.   Pulmonary/Chest: Effort normal and breath sounds normal. He has no wheezes. He has no rhonchi. He has no rales.  Lymphadenopathy:       Head (right side): No submental, no submandibular, no tonsillar, no preauricular, no posterior auricular and no occipital adenopathy present.       Head (left side): No submental, no submandibular, no tonsillar, no preauricular, no posterior auricular and no occipital adenopathy present.    He has no cervical adenopathy.       Right: No supraclavicular adenopathy present.       Left: No supraclavicular adenopathy present.  Neurological: He is alert and oriented to person, place, and time.  Skin: Skin is warm and dry. He is not diaphoretic.  Psychiatric: He has a normal mood and affect.  Vitals reviewed.   Results for orders placed or performed in visit on 11/07/16 (from the past 24 hour(s))  POCT CBC     Status: None   Collection Time: 11/07/16  8:42 AM  Result Value Ref Range   WBC 8.5 4.6 - 10.2 K/uL   Lymph, poc 2.3 0.6 -  3.4   POC LYMPH PERCENT 27.5 10 - 50 %L   MID (cbc) 0.4 0 - 0.9   POC MID % 4.2 0 - 12 %M   POC Granulocyte 5.8 2 - 6.9   Granulocyte percent 68.3 37 - 80 %G   RBC 6.02 4.69 - 6.13 M/uL   Hemoglobin 17.4 14.1 - 18.1 g/dL   HCT, POC 55.3 74.8 - 53.7 %   MCV 86.3 80 - 97 fL   MCH, POC 28.9 27 - 31.2 pg   MCHC 33.5 31.8 - 35.4 g/dL   RDW, POC 27.0 %   Platelet  Count, POC 397 142 - 424 K/uL   MPV 8.7 0 - 99.8 fL    Assessment and Plan :  1. Cough Improving. Pt has clearly responded well to IM ceftriaxone and oral doxycycline. Lungs are CTAB today. SpO2 up to 97%. Vitals stable. WBC has normalized. Encouraged to continue doxycycline to completion. Return as needed.   2. Leukocytosis, unspecified type Resolved.  - POCT CBC  Benjiman Core, PA-C  Primary Care at [facility] 11/07/2016 8:48 AM

## 2016-11-07 NOTE — Patient Instructions (Addendum)
Your physical exam findings and WBC findings are reassuring. I think you are responding well to the medications. I recommend completing doxycycline. Continue using flonase as it also appears to be working. Return to clinic if symptoms worsen, do not improve in 7-10 days, or as needed. Thank you for letting me participate in your health and well being.     IF you received an x-ray today, you will receive an invoice from [facility]. Please contact [facility] at [phone] with questions or concerns regarding your invoice.   IF you received labwork today, you will receive an invoice from [place]. Please contact [facility] at [phone] with questions or concerns regarding your invoice.   Our billing staff will not be able to assist you with questions regarding bills from these companies.  You will be contacted with the lab results as soon as they are available. The fastest way to get your results is to activate your My Chart account. Instructions are located on the last page of this paperwork. If you have not heard from [place] regarding the results in 2 weeks, please contact this office.

## 2016-11-17 ENCOUNTER — Ambulatory Visit (INDEPENDENT_AMBULATORY_CARE_PROVIDER_SITE_OTHER): Payer: BLUE CROSS/BLUE SHIELD | Admitting: Family Medicine

## 2016-11-17 ENCOUNTER — Encounter: Payer: Self-pay | Admitting: Family Medicine

## 2016-11-17 VITALS — BP 118/72 | HR 75 | Temp 98.0°F | Resp 16 | Ht 70.47 in | Wt 228.0 lb

## 2016-11-17 DIAGNOSIS — G4700 Insomnia, unspecified: Secondary | ICD-10-CM | POA: Diagnosis not present

## 2016-11-17 DIAGNOSIS — Z1389 Encounter for screening for other disorder: Secondary | ICD-10-CM

## 2016-11-17 DIAGNOSIS — Z1383 Encounter for screening for respiratory disorder NEC: Secondary | ICD-10-CM

## 2016-11-17 DIAGNOSIS — F411 Generalized anxiety disorder: Secondary | ICD-10-CM | POA: Diagnosis not present

## 2016-11-17 DIAGNOSIS — Z130 Encounter for screening for diseases of the blood and blood-forming organs and certain disorders involving the immune mechanism: Secondary | ICD-10-CM | POA: Diagnosis not present

## 2016-11-17 DIAGNOSIS — Z1329 Encounter for screening for other suspected endocrine disorder: Secondary | ICD-10-CM

## 2016-11-17 DIAGNOSIS — F902 Attention-deficit hyperactivity disorder, combined type: Secondary | ICD-10-CM | POA: Diagnosis not present

## 2016-11-17 DIAGNOSIS — Z136 Encounter for screening for cardiovascular disorders: Secondary | ICD-10-CM

## 2016-11-17 DIAGNOSIS — R5383 Other fatigue: Secondary | ICD-10-CM

## 2016-11-17 MED ORDER — AMPHETAMINE-DEXTROAMPHET ER 10 MG PO CP24: 10.0000 mg | ORAL_CAPSULE | Freq: Two times a day (BID) | ORAL | 0 refills | Status: DC | PRN

## 2016-11-17 MED ORDER — CLONAZEPAM 1 MG PO TABS
1.0000 mg | ORAL_TABLET | Freq: Three times a day (TID) | ORAL | 3 refills | Status: DC | PRN
Start: 2016-11-17 — End: 2017-03-23

## 2016-11-17 NOTE — Patient Instructions (Signed)
     IF you received an x-ray today, you will receive an invoice from [facility]. Please contact [facility] at [phone] with questions or concerns regarding your invoice.   IF you received labwork today, you will receive an invoice from [facility]. Please contact [facility] at [phone] with questions or concerns regarding your invoice.   Our billing staff will not be able to assist you with questions regarding bills from these companies.  You will be contacted with the lab results as soon as they are available. The fastest way to get your results is to activate your My Chart account. Instructions are located on the last page of this paperwork. If you have not heard from us regarding the results in 2 weeks, please contact this office.     

## 2016-11-17 NOTE — Progress Notes (Signed)
Subjective:    Patient ID: Jose Shaffer, male    DOB: [birth year], 46 y.o.   MRN: [id number] Chief Complaint  Patient presents with  . Medication Refill  . ADD    Adderall    HPI  [name] is a delightful 46 yo male here for a 6 mo f/u on his ADD/anxiety/insomnia with med refills.  He did get a stomach bug as he couldn't tolerate the doxycycline anymore - didn't take the last 4 - had a course of augmentin followed by a dose of rocephin and doxycycline.  Not getting as much rest as he should. Bowels improving. Abd pain resolved but appetite not back normal all the way and energy still low though dose.  Did start using the flonase with sig improvement and sleeping better. Stopped using cpap as felt like he was getting stiffled  Did wonder if now he is retaining less fluid - less bloated - since changing supplement regimen and better allergies so not needing cpap. Was getting a lot of muscle cramps with itching. Is getting a very high protein bar - 30g - as a snack. medrix  ADD:  He takes his first XR and see how the day starts and some days he finds he takes the second about lunch.  If it is after 1 pm he won't take the second as he won't be able to sleep and then can't recover.  We did try him on other stimulants over the last sev yrs but concerta caused a sudden late day Drop off when it seems to quickly stop working and this makes him very irritable so he had to use the clonazepam in the mid afternoon to mask this and feels like he really functions best when he has the stimulant for the ADD so he can focus but the bzd so he doesn't worry and be irritable/angry. Also failed vyvanse and adderall IR.    Insomnia: Doesn't sleep as well when he has been to physically active or to busy - body just feels to tired to shut down.  He takes clonazepam  at night.  He can't take it if he has eaten as it won't be strong enough to work. Benadryl causes to much hangover but melatonin works somewhat.   Used zolpidem and GHB prior which both worked well.  We did a trial of adding trazodone to the clonazepam 2 mg at night at his last visit 4 months prior.  Anxiety/Mood d/o: He wonders if he is having some anxiety as when he takes the clonazepam in the evening those few hours before bed are when he feels most clear-headed and productive (though admits he has always been a nighttime person and did his best work in the evenings). Did try taking 1 the klonopin during the day and it would help the irritability but then he would lack the ability to sleep at night as 1 klonopin does not make him tired. No desire to change bzd to shorter/quicker acting - likes how long this lasts but feels like he is on a low dose of  qhs and requests increase.  Finds himself really snapping at his wife, loosing it w/ the people closest to him.  Therefore we added in a second dose of Klonopin in the afternoon in addition to his evening dose he takes for sleep.  No EToH.  Sprained ankle at 46 yo playing basketball - put cast on and he sledded in it and it cracked open against a tree so  he cut the cast off himself when sprain wasn't fully healed so since he has always noted  increased ROM w/ inversion and has twice gotten stuck at almost a 90 degree angle of inversion.  BUt now has developed "a very sharp pain between foot and tibia" (per pt but he points to lateral malleolus) and loosing dorsiflexion ROM. Has not ever been seen for this in the past 30 yrs. Dr. Fara Chute did his knee -  Has not seen any other ortho in town.  Past Medical History:  Diagnosis Date  . ADD (attention deficit disorder)   . Allergy   . Anxiety state, unspecified 03/10/2013  . Diverticulitis   . Insomnia   . Sleep apnea    Past Surgical History:  Procedure Laterality Date  . INGUINAL HERNIA REPAIR Bilateral 01/22/2015   Procedure: LAPAROSCOPIC BILATERAL INGUINAL HERNIA REPAIR WITH MESH AND UMBILICAL HERNIA REPAIR;  Surgeon: Abigail Miyamoto,  MD;  Location: [facility];  Service: General;  Laterality: Bilateral;  . QUADRICEPS TENDON REPAIR  08/2008  . UMBILICAL HERNIA REPAIR N/A 01/22/2015   Procedure: UMBILICAL HERNIA REPAIR;  Surgeon: Abigail Miyamoto, MD;  Location: [facility];  Service: General;  Laterality: N/A;   Current Outpatient Prescriptions on File Prior to Visit  Medication Sig Dispense Refill  . amphetamine-dextroamphetamine (ADDERALL XR) 10 MG 24 hr capsule Take 1-2 capsules (10-20 mg total) by mouth 2 (two) times daily as needed (ADD). 60 capsule 0  . clonazePAM (KLONOPIN) 1 MG tablet Take 1 tablet (1 mg total) by mouth 3 (three) times daily as needed for anxiety. 90 tablet 3  . doxycycline (VIBRAMYCIN) 100 MG capsule Take 1 capsule (100 mg total) by mouth 2 (two) times daily. 20 capsule 0  . traZODone (DESYREL) 50 MG tablet Take 1-2 tablets (50-100 mg total) by mouth at bedtime as needed for sleep. 60 tablet 3   No current facility-administered medications on file prior to visit.    Allergies  Allergen Reactions  . Ibuprofen Other (See Comments)    Per patient feels like KNOT in throat and itching with rash  . Tramadol Anxiety   Family History  Problem Relation Age of Onset  . Hypertension Father   . Cancer Maternal Grandmother 21       breast cancer  . Hypertension Paternal Grandmother   . COPD Paternal Grandfather        lung cancer; tobacco use, Doctor, general practice  . Cancer Maternal Aunt        breast cancer, late 49's  . Cancer Maternal Aunt        breast cancer; diagnosed late 23's   Social History   Social History  . Marital status: Married    Spouse name: Tristan Schroeder  . Number of children: 0  . Years of education: 12+   Occupational History  . truck Set designer  . contractor    Social History Main Topics  . Smoking status: Never Smoker  . Smokeless tobacco: Never Used  . Alcohol use No  . Drug use: No  . Sexual activity: Yes    Partners: Female     Birth control/ protection: None   Other Topics Concern  . Not on file   Social History Narrative   Lives with wife, Tristan Schroeder   Caffeine 1 cup avg twice weekly   Depression screen [facility] 2/9 11/07/2016 11/04/2016 10/25/2016 07/14/2016 03/01/2016  Decreased Interest 0 0 0 0 0  Down, Depressed, Hopeless 0 0 0 0 0  PHQ - 2 Score 0 0 0 0 0    Review of Systems See hpi    Objective:   Physical Exam  Constitutional: He is oriented to person, place, and time. He appears well-developed and well-nourished. No distress.  HENT:  Head: Normocephalic and atraumatic.  Eyes: Pupils are equal, round, and reactive to light. Conjunctivae are normal. No scleral icterus.  Neck: Normal range of motion. Neck supple. No thyromegaly present.  Cardiovascular: Normal rate, regular rhythm, normal heart sounds and intact distal pulses.   Pulmonary/Chest: Effort normal and breath sounds normal. No respiratory distress.  Musculoskeletal: He exhibits no edema.  Lymphadenopathy:    He has no cervical adenopathy.  Neurological: He is alert and oriented to person, place, and time.  Skin: Skin is warm and dry. He is not diaphoretic.  Psychiatric: He has a normal mood and affect. His behavior is normal.      BP 118/72   Pulse 75   Temp 98 F (36.7 C) (Oral)   Resp 16   Ht 5' 10.47" (1.79 m)   Wt 228 lb (103.4 kg)   SpO2 98%   BMI 32.28 kg/m   Assessment & Plan:   1. Attention deficit hyperactivity disorder (ADHD), combined type   2. Insomnia, unspecified type   3. Anxiety state   4. Screening for cardiovascular, respiratory, and genitourinary diseases   5. Screening for deficiency anemia   6. Screening for thyroid disorder   7. Fatigue, unspecified type     Orders Placed This Encounter  Procedures  . CBC with Differential/Platelet  . Comprehensive metabolic panel    Order Specific Question:   Has the patient fasted?    Answer:   Yes  . Lipid panel    Order Specific Question:   Has the patient fasted?      Answer:   Yes  . TSH    Meds ordered this encounter  Medications  . DISCONTD: amphetamine-dextroamphetamine (ADDERALL XR) 10 MG 24 hr capsule    Sig: Take 1-2 capsules (10-20 mg total) by mouth 2 (two) times daily as needed (ADD).    Dispense:  60 capsule    Refill:  0  . clonazePAM (KLONOPIN) 1 MG tablet    Sig: Take 1 tablet (1 mg total) by mouth 3 (three) times daily as needed for anxiety.    Dispense:  90 tablet    Refill:  3  . amphetamine-dextroamphetamine (ADDERALL XR) 10 MG 24 hr capsule    Sig: Take 1-2 capsules (10-20 mg total) by mouth 2 (two) times daily as needed (ADD).    Dispense:  60 capsule    Refill:  0    May fill 20d after written.  Marland Kitchen. amphetamine-dextroamphetamine (ADDERALL XR) 10 MG 24 hr capsule    Sig: Take 1-2 capsules (10-20 mg total) by mouth 2 (two) times daily as needed (ADD).    Dispense:  60 capsule    Refill:  0    May fill 60d from date written.  Marland Kitchen. amphetamine-dextroamphetamine (ADDERALL XR) 10 MG 24 hr capsule    Sig: Take 1-2 capsules (10-20 mg total) by mouth 2 (two) times daily as needed.    Dispense:  60 capsule    Refill:  0    May fill 40 days after date written    Norberto SorensonEva Shaw, M.D.  Primary Care at [facility]  [place] [address] [place], [place] [address] [phone] phone [phone] fax  11/17/16 5:03 PM

## 2016-11-19 LAB — CBC WITH DIFFERENTIAL/PLATELET
BASOS: 1 %
Basophils Absolute: 0.1 10*3/uL (ref 0.0–0.2)
EOS (ABSOLUTE): 0.1 10*3/uL (ref 0.0–0.4)
Eos: 2 %
HEMOGLOBIN: 16.3 g/dL (ref 13.0–17.7)
Hematocrit: 51 % (ref 37.5–51.0)
IMMATURE GRANS (ABS): 0 10*3/uL (ref 0.0–0.1)
Immature Granulocytes: 0 %
Lymphocytes Absolute: 2.3 10*3/uL (ref 0.7–3.1)
Lymphs: 29 %
MCH: 28.2 pg (ref 26.6–33.0)
MCHC: 32 g/dL (ref 31.5–35.7)
MCV: 88 fL (ref 79–97)
MONOS ABS: 0.8 10*3/uL (ref 0.1–0.9)
Monocytes: 10 %
NEUTROS ABS: 4.7 10*3/uL (ref 1.4–7.0)
Neutrophils: 58 %
Platelets: 397 10*3/uL — ABNORMAL HIGH (ref 150–379)
RBC: 5.77 x10E6/uL (ref 4.14–5.80)
RDW: 15.2 % (ref 12.3–15.4)
WBC: 8.1 10*3/uL (ref 3.4–10.8)

## 2016-11-19 LAB — COMPREHENSIVE METABOLIC PANEL
ALBUMIN: 4.6 g/dL (ref 3.5–5.5)
ALT: 85 IU/L — ABNORMAL HIGH (ref 0–44)
AST: 56 IU/L — ABNORMAL HIGH (ref 0–40)
Albumin/Globulin Ratio: 1.6 (ref 1.2–2.2)
Alkaline Phosphatase: 42 IU/L (ref 39–117)
BILIRUBIN TOTAL: 0.4 mg/dL (ref 0.0–1.2)
BUN/Creatinine Ratio: 16 (ref 9–20)
BUN: 17 mg/dL (ref 6–24)
CHLORIDE: 102 mmol/L (ref 96–106)
CO2: 21 mmol/L (ref 20–29)
Calcium: 10.4 mg/dL — ABNORMAL HIGH (ref 8.7–10.2)
Creatinine, Ser: 1.08 mg/dL (ref 0.76–1.27)
GFR calc non Af Amer: 82 mL/min/{1.73_m2} (ref >59)
GFR, EST AFRICAN AMERICAN: 95 mL/min/{1.73_m2} (ref >59)
GLUCOSE: 82 mg/dL (ref 65–99)
Globulin, Total: 2.9 g/dL (ref 1.5–4.5)
POTASSIUM: 4.5 mmol/L (ref 3.5–5.2)
Sodium: 142 mmol/L (ref 134–144)
Total Protein: 7.5 g/dL (ref 6.0–8.5)

## 2016-11-19 LAB — LIPID PANEL
CHOLESTEROL TOTAL: 165 mg/dL (ref 100–199)
Chol/HDL Ratio: 10.3 ratio — ABNORMAL HIGH (ref 0.0–5.0)
HDL: 16 mg/dL — AB (ref >39)
LDL Calculated: 119 mg/dL — ABNORMAL HIGH (ref 0–99)
TRIGLYCERIDES: 151 mg/dL — AB (ref 0–149)
VLDL CHOLESTEROL CAL: 30 mg/dL (ref 5–40)

## 2016-11-19 LAB — TSH: TSH: 1.1 u[IU]/mL (ref 0.450–4.500)

## 2016-11-21 ENCOUNTER — Other Ambulatory Visit: Payer: Self-pay | Admitting: Family Medicine

## 2017-03-22 ENCOUNTER — Telehealth: Payer: Self-pay | Admitting: Family Medicine

## 2017-03-22 NOTE — Telephone Encounter (Signed)
Tried to call pt to remind them of their appt tomorrow 03/23/17 °

## 2017-03-23 ENCOUNTER — Encounter: Payer: Self-pay | Admitting: Gastroenterology

## 2017-03-23 ENCOUNTER — Other Ambulatory Visit: Payer: Self-pay

## 2017-03-23 ENCOUNTER — Ambulatory Visit (INDEPENDENT_AMBULATORY_CARE_PROVIDER_SITE_OTHER): Payer: BLUE CROSS/BLUE SHIELD | Admitting: Family Medicine

## 2017-03-23 ENCOUNTER — Encounter: Payer: Self-pay | Admitting: Family Medicine

## 2017-03-23 VITALS — BP 128/76 | HR 88 | Temp 98.9°F | Resp 16 | Ht 70.47 in | Wt 244.8 lb

## 2017-03-23 DIAGNOSIS — H8112 Benign paroxysmal vertigo, left ear: Secondary | ICD-10-CM | POA: Diagnosis not present

## 2017-03-23 DIAGNOSIS — G4700 Insomnia, unspecified: Secondary | ICD-10-CM

## 2017-03-23 DIAGNOSIS — S30851S Superficial foreign body of abdominal wall, sequela: Secondary | ICD-10-CM | POA: Diagnosis not present

## 2017-03-23 DIAGNOSIS — F411 Generalized anxiety disorder: Secondary | ICD-10-CM

## 2017-03-23 DIAGNOSIS — R109 Unspecified abdominal pain: Secondary | ICD-10-CM

## 2017-03-23 DIAGNOSIS — R1033 Periumbilical pain: Secondary | ICD-10-CM

## 2017-03-23 DIAGNOSIS — Z9889 Other specified postprocedural states: Secondary | ICD-10-CM | POA: Diagnosis not present

## 2017-03-23 DIAGNOSIS — Z8719 Personal history of other diseases of the digestive system: Secondary | ICD-10-CM

## 2017-03-23 DIAGNOSIS — F902 Attention-deficit hyperactivity disorder, combined type: Secondary | ICD-10-CM

## 2017-03-23 MED ORDER — CLONAZEPAM 1 MG PO TABS: 1.0000 mg | ORAL_TABLET | Freq: Three times a day (TID) | ORAL | 2 refills | Status: DC | PRN

## 2017-03-23 MED ORDER — MECLIZINE HCL 25 MG PO TABS: 25.0000 mg | ORAL_TABLET | Freq: Three times a day (TID) | ORAL | 0 refills | Status: DC | PRN

## 2017-03-23 MED ORDER — AMPHETAMINE-DEXTROAMPHET ER 10 MG PO CP24: 10.0000 mg | ORAL_CAPSULE | Freq: Two times a day (BID) | ORAL | 0 refills | Status: DC | PRN

## 2017-03-23 NOTE — Patient Instructions (Addendum)
   IF you received an x-ray today, you will receive an invoice from [facility]. Please contact  Radiology at [phone] with questions or concerns regarding your invoice.   IF you received labwork today, you will receive an invoice from [facility]. Please contact [facility] at [phone] with questions or concerns regarding your invoice.   Our billing staff will not be able to assist you with questions regarding bills from these companies.  You will be contacted with the lab results as soon as they are available. The fastest way to get your results is to activate your My Chart account. Instructions are located on the last page of this paperwork. If you have not heard from us regarding the results in 2 weeks, please contact this office.    How to Perform the Epley Maneuver The Epley maneuver is an exercise that relieves symptoms of vertigo. Vertigo is the feeling that you or your surroundings are moving when they are not. When you feel vertigo, you may feel like the room is spinning and have trouble walking. Dizziness is a little different than vertigo. When you are dizzy, you may feel unsteady or light-headed. You can do this maneuver at home whenever you have symptoms of vertigo. You can do it up to 3 times a day until your symptoms go away. Even though the Epley maneuver may relieve your vertigo for a few weeks, it is possible that your symptoms will return. This maneuver relieves vertigo, but it does not relieve dizziness. What are the risks? If it is done correctly, the Epley maneuver is considered safe. Sometimes it can lead to dizziness or nausea that goes away after a short time. If you develop other symptoms, such as changes in vision, weakness, or numbness, stop doing the maneuver and call your health care provider. How to perform the Epley maneuver 1. Sit on the edge of a bed or table with your back straight and your legs extended or hanging over the edge of the bed  or table. 2. Turn your head halfway toward the affected ear or side. 3. Lie backward quickly with your head turned until you are lying flat on your back. You may want to position a pillow under your shoulders. 4. Hold this position for 30 seconds. You may experience an attack of vertigo. This is normal. 5. Turn your head to the opposite direction until your unaffected ear is facing the floor. 6. Hold this position for 30 seconds. You may experience an attack of vertigo. This is normal. Hold this position until the vertigo stops. 7. Turn your whole body to the same side as your head. Hold for another 30 seconds. 8. Sit back up. You can repeat this exercise up to 3 times a day. Follow these instructions at home:  After doing the Epley maneuver, you can return to your normal activities.  Ask your health care provider if there is anything you should do at home to prevent vertigo. He or she may recommend that you: ? Keep your head raised (elevated) with two or more pillows while you sleep. ? Do not sleep on the side of your affected ear. ? Get up slowly from bed. ? Avoid sudden movements during the day. ? Avoid extreme head movement, like looking up or bending over. Contact a health care provider if:  Your vertigo gets worse.  You have other symptoms, including: ? Nausea. ? Vomiting. ? Headache. Get help right away if:  You have vision changes.  You have   a severe or worsening headache or neck pain.  You cannot stop vomiting.  You have new numbness or weakness in any part of your body. Summary  Vertigo is the feeling that you or your surroundings are moving when they are not.  The Epley maneuver is an exercise that relieves symptoms of vertigo.  If the Epley maneuver is done correctly, it is considered safe. You can do it up to 3 times a day. This information is not intended to replace advice given to you by your health care provider. Make sure you discuss any questions you have  with your health care provider. Document Released: 03/05/2013 Document Revised: 01/19/2016 Document Reviewed: 01/19/2016 Elsevier Interactive Patient Education  2017 Elsevier Inc.  Benign Positional Vertigo Vertigo is the feeling that you or your surroundings are moving when they are not. Benign positional vertigo is the most common form of vertigo. The cause of this condition is not serious (is benign). This condition is triggered by certain movements and positions (is positional). This condition can be dangerous if it occurs while you are doing something that could endanger you or others, such as driving. What are the causes? In many cases, the cause of this condition is not known. It may be caused by a disturbance in an area of the inner ear that helps your brain to sense movement and balance. This disturbance can be caused by a viral infection (labyrinthitis), head injury, or repetitive motion. What increases the risk? This condition is more likely to develop in:  Women.  People who are 50 years of age or older.  What are the signs or symptoms? Symptoms of this condition usually happen when you move your head or your eyes in different directions. Symptoms may start suddenly, and they usually last for less than a minute. Symptoms may include:  Loss of balance and falling.  Feeling like you are spinning or moving.  Feeling like your surroundings are spinning or moving.  Nausea and vomiting.  Blurred vision.  Dizziness.  Involuntary eye movement (nystagmus).  Symptoms can be mild and cause only slight annoyance, or they can be severe and interfere with daily life. Episodes of benign positional vertigo may return (recur) over time, and they may be triggered by certain movements. Symptoms may improve over time. How is this diagnosed? This condition is usually diagnosed by medical history and a physical exam of the head, neck, and ears. You may be referred to a health care provider  who specializes in ear, nose, and throat (ENT) problems (otolaryngologist) or a provider who specializes in disorders of the nervous system (neurologist). You may have additional testing, including:  MRI.  A CT scan.  Eye movement tests. Your health care provider may ask you to change positions quickly while he or she watches you for symptoms of benign positional vertigo, such as nystagmus. Eye movement may be tested with an electronystagmogram (ENG), caloric stimulation, the Dix-Hallpike test, or the roll test.  An electroencephalogram (EEG). This records electrical activity in your brain.  Hearing tests.  How is this treated? Usually, your health care provider will treat this by moving your head in specific positions to adjust your inner ear back to normal. Surgery may be needed in severe cases, but this is rare. In some cases, benign positional vertigo may resolve on its own in 2-4 weeks. Follow these instructions at home: Safety  Move slowly.Avoid sudden body or head movements.  Avoid driving.  Avoid operating heavy machinery.  Avoid doing any   tasks that would be dangerous to you or others if a vertigo episode would occur.  If you have trouble walking or keeping your balance, try using a cane for stability. If you feel dizzy or unstable, sit down right away.  Return to your normal activities as told by your health care provider. Ask your health care provider what activities are safe for you. General instructions  Take over-the-counter and prescription medicines only as told by your health care provider.  Avoid certain positions or movements as told by your health care provider.  Drink enough fluid to keep your urine clear or pale yellow.  Keep all follow-up visits as told by your health care provider. This is important. Contact a health care provider if:  You have a fever.  Your condition gets worse or you develop new symptoms.  Your family or friends notice any  behavioral changes.  Your nausea or vomiting gets worse.  You have numbness or a "pins and needles" sensation. Get help right away if:  You have difficulty speaking or moving.  You are always dizzy.  You faint.  You develop severe headaches.  You have weakness in your legs or arms.  You have changes in your hearing or vision.  You develop a stiff neck.  You develop sensitivity to light. This information is not intended to replace advice given to you by your health care provider. Make sure you discuss any questions you have with your health care provider. Document Released: 12/06/2005 Document Revised: 08/06/2015 Document Reviewed: 06/23/2014 Elsevier Interactive Patient Education  2018 Elsevier Inc.  

## 2017-03-23 NOTE — Progress Notes (Signed)
Subjective:  By signing my name below, I, Essence Howell, attest that this documentation has been prepared under the direction and in the presence of [name], MD Electronically Signed: Charline Bills, ED Scribe 03/23/2017 at 8:52 AM.   Patient ID: Jose Shaffer, male    DOB: 1970/07/25, 47 y.o.   MRN: [id number]  Chief Complaint  Patient presents with  . Medication Refill    adderall, klonopin    HPI Jose Shaffer is a 47 y.o. male who presents to Primary Care at [facility] for a medication refill. Last seen 4 months prior. On adderall XR 10 mg bid for ADD. Failed concerta, vyvanse, adderall IR for anxiety and mood increases throughout the day as adderall wears off. Feels he responds very well to long-lasting which we increased from 2 mg qhs to add 1 mg during the day.  Insomnia   Takes klonopin 2 mg qhs with trazodone 50 prn. Adderall filled 10/4, 11/6, 12/10 and klonopin filled 9/16, 10/15, 11/17, 12/16. Pt states that adding in klonopin has improved sleep. 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  Abdominal Pain H/o pseudo blockage four times. Reports that he experienced similar abdominal cramping, diarrhea and nausea with difficulty urinating x 8-10 hours twice last week. He has also felt the mesh from his hernia recently which he is concerned will "poke through" his skin. He takes Pepto Bismol and minimizes food/fluid intake when he experiences these episodes. Denies blood in stools, melena, vomiting. He was scheduled with GI but states that he didn't follow up.  Dizziness Pt also presents with dizziness x 2 weeks. He initially noticed dizziness when he sat up one morning to get out of bed. Dizziness is positional and worse with rolling on the L side but only lasts for a few mins.  Patient Active Problem List   Diagnosis Date Noted  . Abdominal pain, left lower quadrant 11/12/2014  . Sleep apnea  04/15/2014  . Snoring 04/15/2014  . Insomnia 03/10/2013  . Anxiety state 03/10/2013  . ADD (attention deficit disorder) 09/11/2011   Past Medical History:  Diagnosis Date  . ADD (attention deficit disorder)   . Allergy   . Anxiety state, unspecified 03/10/2013  . Diverticulitis   . Insomnia   . Sleep apnea    Past Surgical History:  Procedure Laterality Date  . INGUINAL HERNIA REPAIR Bilateral 01/22/2015   Procedure: LAPAROSCOPIC BILATERAL INGUINAL HERNIA REPAIR WITH MESH AND UMBILICAL HERNIA REPAIR;  Surgeon: Abigail Miyamoto, MD;  Location: [facility];  Service: General;  Laterality: Bilateral;  . QUADRICEPS TENDON REPAIR  08/2008  . UMBILICAL HERNIA REPAIR N/A 01/22/2015   Procedure: UMBILICAL HERNIA REPAIR;  Surgeon: Abigail Miyamoto, MD;  Location: [facility];  Service: General;  Laterality: N/A;   Allergies  Allergen Reactions  . Ibuprofen Other (See Comments)    Per patient feels like KNOT in throat and itching with rash  . Tramadol Anxiety   Prior to Admission medications   Medication Sig Start Date End Date Taking? Authorizing Provider  amphetamine-dextroamphetamine (ADDERALL XR) 10 MG 24 hr capsule Take 1-2 capsules (10-20 mg total) by mouth 2 (two) times daily as needed (ADD). 12/07/16  Yes Sherren Mocha, MD  amphetamine-dextroamphetamine (ADDERALL XR) 10 MG 24 hr capsule Take 1-2 capsules (10-20 mg total) by mouth 2 (two) times daily as needed (ADD). 01/16/17  Yes Sherren Mocha, MD  amphetamine-dextroamphetamine (  ADDERALL XR) 10 MG 24 hr capsule Take 1-2 capsules (10-20 mg total) by mouth 2 (two) times daily as needed. 12/27/16  Yes Sherren Mocha, MD  clonazePAM (KLONOPIN) 1 MG tablet Take 1 tablet (1 mg total) by mouth 3 (three) times daily as needed for anxiety. 11/17/16  Yes Sherren Mocha, MD  traZODone (DESYREL) 50 MG tablet TAKE 1 TO 2 TABLETS(50 TO 100 MG) BY MOUTH AT BEDTIME AS NEEDED FOR SLEEP Patient not taking: Reported on 03/23/2017 11/21/16    Sherren Mocha, MD   Social History   Socioeconomic History  . Marital status: Married    Spouse name: Tristan Schroeder  . Number of children: 0  . Years of education: 12+  . Highest education level: Not on file  Social Needs  . Financial resource strain: Not on file  . Food insecurity - worry: Not on file  . Food insecurity - inability: Not on file  . Transportation needs - medical: Not on file  . Transportation needs - non-medical: Not on file  Occupational History  . Occupation: truck Air traffic controller: SELF-EMPLOYED  . Occupation: Surveyor, minerals  Tobacco Use  . Smoking status: Never Smoker  . Smokeless tobacco: Never Used  Substance and Sexual Activity  . Alcohol use: No  . Drug use: No  . Sexual activity: Yes    Partners: Female    Birth control/protection: None  Other Topics Concern  . Not on file  Social History Narrative   Lives with wife, Tristan Schroeder   Caffeine 1 cup avg twice weekly   Past Surgical History:  Procedure Laterality Date  . INGUINAL HERNIA REPAIR Bilateral 01/22/2015   Procedure: LAPAROSCOPIC BILATERAL INGUINAL HERNIA REPAIR WITH MESH AND UMBILICAL HERNIA REPAIR;  Surgeon: Abigail Miyamoto, MD;  Location: [facility];  Service: General;  Laterality: Bilateral;  . QUADRICEPS TENDON REPAIR  08/2008  . UMBILICAL HERNIA REPAIR N/A 01/22/2015   Procedure: UMBILICAL HERNIA REPAIR;  Surgeon: Abigail Miyamoto, MD;  Location: [facility];  Service: General;  Laterality: N/A;   Family History  Problem Relation Age of Onset  . Hypertension Father   . Cancer Maternal Grandmother 34       breast cancer  . Hypertension Paternal Grandmother   . COPD Paternal Grandfather        lung cancer; tobacco use, Doctor, general practice  . Cancer Maternal Aunt        breast cancer, late 37's  . Cancer Maternal Aunt        breast cancer; diagnosed late 56's   Social History   Socioeconomic History  . Marital status: Married    Spouse name: Tristan Schroeder  . Number of  children: 0  . Years of education: 12+  . Highest education level: None  Social Needs  . Financial resource strain: None  . Food insecurity - worry: None  . Food insecurity - inability: None  . Transportation needs - medical: None  . Transportation needs - non-medical: None  Occupational History  . Occupation: truck Air traffic controller: SELF-EMPLOYED  . Occupation: Surveyor, minerals  Tobacco Use  . Smoking status: Never Smoker  . Smokeless tobacco: Never Used  Substance and Sexual Activity  . Alcohol use: No  . Drug use: No  . Sexual activity: Yes    Partners: Female    Birth control/protection: None  Other Topics Concern  . None  Social History Narrative   Lives with wife, Tristan Schroeder   Caffeine 1 cup  avg twice weekly   Depression screen [facility] 2/9 03/23/2017 11/17/2016 11/07/2016 11/04/2016 10/25/2016  Decreased Interest 0 0 0 0 0  Down, Depressed, Hopeless 0 0 0 0 0  PHQ - 2 Score 0 0 0 0 0    Review of Systems  Gastrointestinal: Positive for abdominal pain, diarrhea and nausea. Negative for blood in stool and vomiting.  Genitourinary: Positive for difficulty urinating.  Neurological: Positive for dizziness.      Objective:   Physical Exam  Constitutional: He is oriented to person, place, and time. He appears well-developed and well-nourished. No distress.  HENT:  Head: Normocephalic and atraumatic.  Right Ear: Tympanic membrane normal.  Left Ear: Tympanic membrane normal.  Nose: Nose normal.  Mouth/Throat: Oropharynx is clear and moist.  TMs: mildly irritated  Eyes: Conjunctivae and EOM are normal.  Neck: Neck supple. No tracheal deviation present.  Cardiovascular: Normal rate.  Pulmonary/Chest: Effort normal. No respiratory distress.  Abdominal: Soft. Bowel sounds are normal. There is no hepatosplenomegaly. There is no tenderness.  3 mm erythematous papules at upper umbilicus with a very sharp pinpoint subcutaneous foreign body beneath.  Musculoskeletal: Normal range of motion.    Neurological: He is alert and oriented to person, place, and time.  Skin: Skin is warm and dry.  Psychiatric: He has a normal mood and affect. His behavior is normal.  Nursing note and vitals reviewed.  BP 128/76   Pulse 88   Temp 98.9 F (37.2 C)   Resp 16   Ht 5' 10.47" (1.79 m)   Wt 244 lb 12.8 oz (111 kg)   SpO2 97%   BMI 34.66 kg/m     Assessment & Plan:  Adderall can be filled 1/10, 2/7, 3/7.  1. Recurrent abdominal pain   2. Anxiety state   3. History of colitis   4. Status post hernia repair   5. Abdominal wall pain in periumbilical region   6. Foreign body of abdominal wall, sequela   7. Attention deficit hyperactivity disorder (ADHD), combined type   8. Insomnia, unspecified type   9. Benign paroxysmal positional vertigo of left ear - reviewed epley manuvers    Orders Placed This Encounter  Procedures  . Ambulatory referral to General Surgery    Referral Priority:   Routine    Referral Type:   Surgical    Referral Reason:   Specialty Services Required    Requested Specialty:   General Surgery    Number of Visits Requested:   1  . Ambulatory referral to Gastroenterology    Referral Priority:   Routine    Referral Type:   Consultation    Referral Reason:   Specialty Services Required    Number of Visits Requested:   1    Meds ordered this encounter  Medications  . clonazePAM (KLONOPIN) 1 MG tablet    Sig: Take 1 tablet (1 mg total) by mouth 3 (three) times daily as needed for anxiety.    Dispense:  90 tablet    Refill:  2  . amphetamine-dextroamphetamine (ADDERALL XR) 10 MG 24 hr capsule    Sig: Take 1-2 capsules (10-20 mg total) by mouth 2 (two) times daily as needed.    Dispense:  60 capsule    Refill:  0  . amphetamine-dextroamphetamine (ADDERALL XR) 10 MG 24 hr capsule    Sig: Take 1-2 capsules (10-20 mg total) by mouth 2 (two) times daily as needed (ADD).    Dispense:  60 capsule  Refill:  0  . amphetamine-dextroamphetamine (ADDERALL XR) 10  MG 24 hr capsule    Sig: Take 1-2 capsules (10-20 mg total) by mouth 2 (two) times daily as needed (ADD).    Dispense:  60 capsule    Refill:  0  . meclizine (ANTIVERT) 25 MG tablet    Sig: Take 1 tablet (25 mg total) by mouth 3 (three) times daily as needed for dizziness.    Dispense:  30 tablet    Refill:  0    I personally performed the services described in this documentation, which was scribed in my presence. The recorded information has been reviewed and considered, and addended by me as needed.   [name], M.D.  Primary Care at [facility] [address] [place], [place] [address] [phone] phone [phone] fax  03/23/17 1:37 PM

## 2017-04-28 ENCOUNTER — Encounter (INDEPENDENT_AMBULATORY_CARE_PROVIDER_SITE_OTHER): Payer: Self-pay

## 2017-04-28 ENCOUNTER — Ambulatory Visit (INDEPENDENT_AMBULATORY_CARE_PROVIDER_SITE_OTHER): Payer: BLUE CROSS/BLUE SHIELD | Admitting: Gastroenterology

## 2017-04-28 ENCOUNTER — Encounter: Payer: Self-pay | Admitting: Gastroenterology

## 2017-04-28 VITALS — BP 126/82 | HR 74 | Ht 70.0 in | Wt 246.4 lb

## 2017-04-28 DIAGNOSIS — K5732 Diverticulitis of large intestine without perforation or abscess without bleeding: Secondary | ICD-10-CM | POA: Diagnosis not present

## 2017-04-28 DIAGNOSIS — R1033 Periumbilical pain: Secondary | ICD-10-CM | POA: Diagnosis not present

## 2017-04-28 DIAGNOSIS — K625 Hemorrhage of anus and rectum: Secondary | ICD-10-CM

## 2017-04-28 DIAGNOSIS — R1032 Left lower quadrant pain: Secondary | ICD-10-CM

## 2017-04-28 MED ORDER — NA SULFATE-K SULFATE-MG SULF 17.5-3.13-1.6 GM/177ML PO SOLN: 1.0000 | Freq: Once | ORAL | 0 refills | Status: AC

## 2017-04-28 NOTE — Patient Instructions (Addendum)
You have been scheduled for a CT scan of the abdomen and pelvis at [facility] ([address]---this is in the same building as Press [facility]).   You are scheduled on 2/26 at 2pm. You should arrive 15 minutes prior to your appointment time for registration. Please follow the written instructions below on the day of your exam:  WARNING: IF YOU ARE ALLERGIC TO IODINE/X-RAY DYE, PLEASE NOTIFY RADIOLOGY IMMEDIATELY AT [phone]! YOU WILL BE GIVEN A 13 HOUR PREMEDICATION PREP.  1) Do not eat or drink anything after 10am (4 hours prior to your test) 2) You have been given 2 bottles of oral contrast to drink. The solution may taste               better if refrigerated, but do NOT add ice or any other liquid to this solution. Shake             well before drinking.    Drink 1 bottle of contrast @ 12pm (2 hours prior to your exam)  Drink 1 bottle of contrast @ 1pm (1 hour prior to your exam)  You may take any medications as prescribed with a small amount of water except for the following: Metformin, Glucophage, Glucovance, Avandamet, Riomet, Fortamet, Actoplus Met, Janumet, Glumetza or Metaglip. The above medications must be held the day of the exam AND 48 hours after the exam.  The purpose of you drinking the oral contrast is to aid in the visualization of your intestinal tract. The contrast solution may cause some diarrhea. Before your exam is started, you will be given a small amount of fluid to drink. Depending on your individual set of symptoms, you may also receive an intravenous injection of x-ray contrast/dye. Plan on being at [facility] for 30 minutes or longer, depending on the type of exam you are having performed.  This test typically takes 30-45 minutes to complete.  If you have any questions regarding your exam or if you need to reschedule, you may call the CT department at [phone] between the hours of 8:00 am and 5:00 pm, Monday-Friday.  You have been  scheduled for a colonoscopy. Please follow written instructions given to you at your visit today.  Please pick up your prep supplies at the pharmacy within the next 1-3 days. If you use inhalers (even only as needed), please bring them with you on the day of your procedure. Your physician has requested that you go to www.startemmi.com and enter the access code given to you at your visit today. This web site gives a general overview about your procedure. However, you should still follow specific instructions given to you by our office regarding your preparation for the procedure.  Take Benefiber 1 tablespoon 2-3 times daily   ________________________________________________________________________

## 2017-04-28 NOTE — Progress Notes (Signed)
Jose Shaffer    [id number]    [birth year]  Primary Care Physician:Shaw, Levell July, MD  Referring Physician: Sherren Mocha, MD [address] [place], [place] [address]  Chief complaint:  Abdominal pain, history of diverticulitis, rectal bleeding HPI: 71 yr M here for evlauation with multiple GI complaints. Predominantly LLQ abdominal pain intermittent, worse with certain foods especially high fiber diet. He gets extremely bloated and gets constipated, sometimes cant even pass flatus. He presented to ER in August 2016 with similar complaints and was thought secondary to hernia, had a loop of small bowel entering the umbilical hernia, underwent repair of umbilical and b/l inguinal hernia.  He continues to have episodic symptoms, he learnt to self manage, avoid eating anything and usually within a day he passes a lot of flatus and pain and bloating resolves. He is avoiding high fiber diet, currently eating mostly soft foods.  On CT abd & pelvis 10/15/2014 showed significant left sided diverticulosis and inflammatory process in the pelvis with pericolonic stranding in the sigmoid region concerning for acute diverticulitis vs segmental colitis. He also complaints of intermittent diarrhea with semi formed stool alternating with episodes of constipation. Intermittent BRBPR for past 6-12 months, mostly when he wipes Denies any dysphagia, odynophagia, nausea, or vomiting. No weight loss.  Never had colonoscopy.  No family history of colon cancer or IBD.  In the umbilical area he can feel suture sub cutaneous and is worried if it will perforate through.   Outpatient Encounter Medications as of 04/28/2017  Medication Sig  . amphetamine-dextroamphetamine (ADDERALL XR) 10 MG 24 hr capsule Take 1-2 capsules (10-20 mg total) by mouth 2 (two) times daily as needed (ADD).  Marland Kitchen clonazePAM (KLONOPIN) 1 MG tablet Take 1 tablet (1 mg total) by mouth 3 (three) times daily as needed for anxiety.  . Multiple  Vitamin (MULTIVITAMIN) tablet Take 1 tablet by mouth daily.  . naproxen sodium (ALEVE) 220 MG tablet Take 220 mg by mouth daily as needed.  . [DISCONTINUED] amphetamine-dextroamphetamine (ADDERALL XR) 10 MG 24 hr capsule Take 1-2 capsules (10-20 mg total) by mouth 2 (two) times daily as needed.  . [DISCONTINUED] amphetamine-dextroamphetamine (ADDERALL XR) 10 MG 24 hr capsule Take 1-2 capsules (10-20 mg total) by mouth 2 (two) times daily as needed (ADD).  . [DISCONTINUED] meclizine (ANTIVERT) 25 MG tablet Take 1 tablet (25 mg total) by mouth 3 (three) times daily as needed for dizziness.   No facility-administered encounter medications on file as of 04/28/2017.     Allergies as of 04/28/2017 - Review Complete 04/28/2017  Allergen Reaction Noted  . Ibuprofen Other (See Comments) 04/08/2010  . Tramadol Anxiety 01/15/2015    Past Medical History:  Diagnosis Date  . ADD (attention deficit disorder)   . Allergy   . Anxiety state, unspecified 03/10/2013  . Diverticulitis   . Insomnia   . Sleep apnea     Past Surgical History:  Procedure Laterality Date  . INGUINAL HERNIA REPAIR Bilateral 01/22/2015   Procedure: LAPAROSCOPIC BILATERAL INGUINAL HERNIA REPAIR WITH MESH AND UMBILICAL HERNIA REPAIR;  Surgeon: Abigail Miyamoto, MD;  Location: [facility];  Service: General;  Laterality: Bilateral;  . QUADRICEPS TENDON REPAIR  08/2008  . UMBILICAL HERNIA REPAIR N/A 01/22/2015   Procedure: UMBILICAL HERNIA REPAIR;  Surgeon: Abigail Miyamoto, MD;  Location: [facility];  Service: General;  Laterality: N/A;    Family History  Problem Relation Age of Onset  .  Hypertension Father   . Cancer Maternal Grandmother 4973       breast cancer  . Hypertension Paternal Grandmother   . COPD Paternal Grandfather        lung cancer; tobacco use, Doctor, general practicesteel mill worker  . Cancer Maternal Aunt        breast cancer, late 3950's  . Cancer Maternal Aunt        breast cancer; diagnosed  late 3650's    Social History   Socioeconomic History  . Marital status: Married    Spouse name: Tristan SchroederLien  . Number of children: 0  . Years of education: 12+  . Highest education level: Not on file  Social Needs  . Financial resource strain: Not on file  . Food insecurity - worry: Not on file  . Food insecurity - inability: Not on file  . Transportation needs - medical: Not on file  . Transportation needs - non-medical: Not on file  Occupational History  . Occupation: truck Air traffic controllerdriver    Employer: SELF-EMPLOYED  . Occupation: Surveyor, mineralscontractor  Tobacco Use  . Smoking status: Never Smoker  . Smokeless tobacco: Never Used  Substance and Sexual Activity  . Alcohol use: No  . Drug use: No  . Sexual activity: Yes    Partners: Female    Birth control/protection: None  Other Topics Concern  . Not on file  Social History Narrative   Lives with wife, Tristan SchroederLien   Caffeine 1 cup avg twice weekly      Review of systems: Review of Systems  Constitutional: Negative for fever and chills.  HENT: Negative.   Eyes: Negative for blurred vision.  Respiratory: Negative for cough, shortness of breath and wheezing.   Cardiovascular: Negative for chest pain and palpitations.  Gastrointestinal: as per HPI Genitourinary: Negative for dysuria, urgency, frequency and hematuria.  Musculoskeletal: Negative for myalgias, back pain and joint pain.  Skin: Negative for itching and rash.  Neurological: Negative for dizziness, tremors, focal weakness, seizures and loss of consciousness.  Endo/Heme/Allergies: Positive for seasonal allergies.  Psychiatric/Behavioral: Negative for depression, suicidal ideas and hallucinations.  All other systems reviewed and are negative.   Physical Exam: Vitals:   04/28/17 1424  BP: 126/82  Pulse: 74   Body mass index is 35.35 kg/m. Gen:      No acute distress HEENT:  EOMI, sclera anicteric Neck:     No masses; no thyromegaly Lungs:    Clear to auscultation bilaterally;  normal respiratory effort CV:         Regular rate and rhythm; no murmurs Abd:      + bowel sounds; soft, non-tender; no palpable masses, no distension Ext:    No edema; adequate peripheral perfusion Skin:      Warm and dry; no rash Neuro: alert and oriented x 3 Psych: normal mood and affect Rectal exam: Normal anal sphincter tone, no anal fissure or external hemorrhoids  Data Reviewed:  Reviewed labs, radiology imaging, old records and pertinent past GI work up  CT abd & pelvis with contrast 10/15/2014 1. There is inflammatory process in left pelvis just above the urinary bladder with segmental thickening of sigmoid colon wall and stranding of pericolonic fat. Best seen in coronal image 51. Findings are consistent with segmental colitis or acute diverticulitis. No definite evidence of perforation. 2. There is a left paraumbilical hernia containing fat, small amount air-fluid and partial anterior wall of a small bowel loop without evidence of small bowel obstruction or complication. 3. No hydronephrosis or  hydroureter. 4. Normal appendix.  No pericecal inflammation.  Assessment and Plan/Recommendations:  1 yr M with severe left sided diverticulosis and history of acute sigmoid diverticulitis vs segmental colitis here with complaints of LLQ abdominal pain, episodic partial bowel obstruction that self resolves with dietary changes, alternating constipation and diarrhea and intermittent BRBPR Obtain CT abd & pelvis to evaluate for intermittent episodes of partial bowel obstruction and severe LLQ abdominal pain Schedule for colonoscopy, never had colonoscopy after abnormal CT scan in 2016 with ?acute sigmoid diverticulitis vs segmental colitis and also to evaluate intermittent rectal bleeding.  The risks and benefits as well as alternatives of endoscopic procedure(s) have been discussed and reviewed. All questions answered. The patient agrees to proceed. Benefiber 1 tablespoon TID with  meals. Periumbilical area, suture is palpable subcutaneous and he has mild tenderness, reassured patient and advised him to follow up with surgery to see if it can be removed.     Iona Beard , MD [phone] Mon-Fri 8a-5p [phone] after 5p, weekends, holidays  CC: Sherren Mocha, MD

## 2017-05-05 ENCOUNTER — Other Ambulatory Visit: Payer: Self-pay

## 2017-05-05 ENCOUNTER — Ambulatory Visit (AMBULATORY_SURGERY_CENTER): Payer: BLUE CROSS/BLUE SHIELD | Admitting: Gastroenterology

## 2017-05-05 ENCOUNTER — Encounter: Payer: BLUE CROSS/BLUE SHIELD | Admitting: Gastroenterology

## 2017-05-05 ENCOUNTER — Encounter: Payer: Self-pay | Admitting: Gastroenterology

## 2017-05-05 VITALS — BP 121/69 | HR 87 | Temp 99.3°F | Resp 11 | Ht 70.0 in | Wt 246.0 lb

## 2017-05-05 DIAGNOSIS — R1032 Left lower quadrant pain: Secondary | ICD-10-CM

## 2017-05-05 DIAGNOSIS — K5732 Diverticulitis of large intestine without perforation or abscess without bleeding: Secondary | ICD-10-CM | POA: Diagnosis not present

## 2017-05-05 DIAGNOSIS — R1033 Periumbilical pain: Secondary | ICD-10-CM | POA: Diagnosis not present

## 2017-05-05 MED ORDER — SODIUM CHLORIDE 0.9 % IV SOLN: 500.0000 mL | Freq: Once | INTRAVENOUS | Status: DC

## 2017-05-05 NOTE — Progress Notes (Signed)
To PACU, VSS. Report to [email or web address] 

## 2017-05-05 NOTE — Patient Instructions (Signed)
Handouts given for diverticulosis and hemorrhoids  YOU HAD AN ENDOSCOPIC PROCEDURE TODAY AT THE [facility]:   Refer to the procedure report that was given to you for any specific questions about what was found during the examination.  If the procedure report does not answer your questions, please call your gastroenterologist to clarify.  If you requested that your care partner not be given the details of your procedure findings, then the procedure report has been included in a sealed envelope for you to review at your convenience later.  YOU SHOULD EXPECT: Some feelings of bloating in the abdomen. Passage of more gas than usual.  Walking can help get rid of the air that was put into your GI tract during the procedure and reduce the bloating. If you had a lower endoscopy (such as a colonoscopy or flexible sigmoidoscopy) you may notice spotting of blood in your stool or on the toilet paper. If you underwent a bowel prep for your procedure, you may not have a normal bowel movement for a few days.  Please Note:  You might notice some irritation and congestion in your nose or some drainage.  This is from the oxygen used during your procedure.  There is no need for concern and it should clear up in a day or so.  SYMPTOMS TO REPORT IMMEDIATELY:   Following lower endoscopy (colonoscopy or flexible sigmoidoscopy):  Excessive amounts of blood in the stool  Significant tenderness or worsening of abdominal pains  Swelling of the abdomen that is new, acute  Fever of 100F or higher  For urgent or emergent issues, a gastroenterologist can be reached at any hour by calling [phone].   DIET:  We do recommend a small meal at first, but then you may proceed to your regular diet.  Drink plenty of fluids but you should avoid alcoholic beverages for 24 hours.  ACTIVITY:  You should plan to take it easy for the rest of today and you should NOT DRIVE or use heavy machinery until tomorrow (because  of the sedation medicines used during the test).    FOLLOW UP: Our staff will call the number listed on your records the next business day following your procedure to check on you and address any questions or concerns that you may have regarding the information given to you following your procedure. If we do not reach you, we will leave a message.  However, if you are feeling well and you are not experiencing any problems, there is no need to return our call.  We will assume that you have returned to your regular daily activities without incident.  If any biopsies were taken you will be contacted by phone or by letter within the next 1-3 weeks.  Please call us at [phone] if you have not heard about the biopsies in 3 weeks.    SIGNATURES/CONFIDENTIALITY: You and/or your care partner have signed paperwork which will be entered into your electronic medical record.  These signatures attest to the fact that that the information above on your After Visit Summary has been reviewed and is understood.  Full responsibility of the confidentiality of this discharge information lies with you and/or your care-partner. 

## 2017-05-05 NOTE — Progress Notes (Signed)
No egg allergy- soy upsets stomach No A fib No chronic constipation but does have some issues if gets dehydrated  No diet pills No blood thinners

## 2017-05-05 NOTE — Op Note (Signed)
[facility] Patient Name: Jose Shaffer Procedure Date: 05/05/2017 10:28 AM MRN: [id number] Endoscopist: Napoleon Form , MD Age: 47 Referring MD:  Date of Birth: 1970-05-31 Gender: Male Account #: [id number] Procedure:                Colonoscopy Indications:              Abdominal pain in the left lower quadrant, Abnormal                            CT of the GI tract, Diverticulitis, Follow-up of                            diverticulitis Medicines:                Monitored Anesthesia Care Procedure:                Pre-Anesthesia Assessment:                           - Prior to the procedure, a History and Physical                            was performed, and patient medications and                            allergies were reviewed. The patient's tolerance of                            previous anesthesia was also reviewed. The risks                            and benefits of the procedure and the sedation                            options and risks were discussed with the patient.                            All questions were answered, and informed consent                            was obtained. Prior Anticoagulants: The patient has                            taken no previous anticoagulant or antiplatelet                            agents. ASA Grade Assessment: II - A patient with                            mild systemic disease. After reviewing the risks                            and benefits, the patient was deemed in  satisfactory condition to undergo the procedure.                           After obtaining informed consent, the colonoscope                            was passed under direct vision. Throughout the                            procedure, the patient's blood pressure, pulse, and                            oxygen saturations were monitored continuously. The                            Model PCF-[id number])  scope was introduced                            through the anus and advanced to the the cecum,                            identified by appendiceal orifice and ileocecal                            valve. The colonoscopy was performed without                            difficulty. The patient tolerated the procedure                            well. The quality of the bowel preparation was                            excellent. The ileocecal valve, appendiceal                            orifice, and rectum were photographed. Scope In: 10:36:00 AM Scope Out: 10:53:41 AM Scope Withdrawal Time: 0 hours 10 minutes 4 seconds  Total Procedure Duration: 0 hours 17 minutes 41 seconds  Findings:                 The perianal and digital rectal examinations were                            normal.                           Multiple small and large-mouthed diverticula were                            found in the sigmoid colon and descending colon.                            There was narrowing of the colon in association  with the diverticular opening. There was evidence                            of an impacted diverticulum. There was no evidence                            of diverticular bleeding.                           Non-bleeding internal hemorrhoids were found during                            retroflexion. The hemorrhoids were small.                           The exam was otherwise without abnormality. Complications:            No immediate complications. Estimated Blood Loss:     Estimated blood loss: none. Impression:               - Severe diverticulosis in the sigmoid colon and in                            the descending colon. There was narrowing of the                            colon in association with the diverticular opening.                            There was evidence of an impacted diverticulum.                            There was no evidence of  diverticular bleeding.                           - Non-bleeding internal hemorrhoids.                           - The examination was otherwise normal.                           - No specimens collected. Recommendation:           - Patient has a contact number available for                            emergencies. The signs and symptoms of potential                            delayed complications were discussed with the                            patient. Return to normal activities tomorrow.                            Written discharge instructions were provided to  the                            patient.                           - Resume previous diet.                           - Continue present medications.                           - Repeat colonoscopy in 10 years for screening                            purposes. Napoleon Form, MD 05/05/2017 10:59:16 AM This report has been signed electronically.

## 2017-05-08 ENCOUNTER — Telehealth: Payer: Self-pay | Admitting: *Deleted

## 2017-05-08 NOTE — Telephone Encounter (Signed)
  Follow up Call-  Call back number 05/05/2017  Post procedure Call Back phone  # [phone]  Permission to leave phone message Yes  Some recent data might be hidden     No answer at # given.  LM on VM.

## 2017-05-08 NOTE — Telephone Encounter (Signed)
  Follow up Call-  Call back number 05/05/2017  Post procedure Call Back phone  # [phone]  Permission to leave phone message Yes  Some recent data might be hidden     Patient questions:  Do you have a fever, pain , or abdominal swelling? No. Pain Score  0 *  Have you tolerated food without any problems? Yes.    Have you been able to return to your normal activities? Yes.    Do you have any questions about your discharge instructions: Diet   No. Medications  No. Follow up visit  No.  Do you have questions or concerns about your Care? No.  Actions: * If pain score is 4 or above: No action needed, pain <4.

## 2017-05-09 ENCOUNTER — Other Ambulatory Visit: Payer: BLUE CROSS/BLUE SHIELD

## 2017-05-22 ENCOUNTER — Other Ambulatory Visit: Payer: Self-pay | Admitting: Family Medicine

## 2017-05-22 NOTE — Telephone Encounter (Signed)
LOV 03/23/2017 with Dr. Clelia CroftShaw / Refill request for Adderall /

## 2017-05-22 NOTE — Telephone Encounter (Signed)
Copied from CRM [id number]. Topic: Quick Communication - Rx Refill/Question >> May 22, 2017  3:20 PM Avie ArenasSimmons, Ulus Hazen L, NT wrote: Medication:  amphetamine-dextroamphetamine (ADDERALL XR) 10 MG 24 hr capsule  Has the patient contacted their pharmacy? yes  (Agent: If no, request that the patient contact the pharmacy for the refill.) Preferred Pharmacy (with phone number or street name): [id number] - [place], [place] - [address] AT [facility] [address] & [phone] (Phone) [phone] (Fax Agent: Please be advised that RX refills may take up to 3 business days. We ask that you follow-up with your pharmacy. Patient is out of medication

## 2017-05-23 ENCOUNTER — Telehealth: Payer: Self-pay | Admitting: Family Medicine

## 2017-05-23 NOTE — Telephone Encounter (Signed)
Duplicate encounter. See refill encounter dated 05/22/2017.

## 2017-05-23 NOTE — Telephone Encounter (Signed)
Copied from CRM [phone]. Topic: Quick Communication - Rx Refill/Question >> May 23, 2017  1:31 PM [name], [name], [place] wrote: Medication: amphetamine-dextroamphetamine (ADDERALL XR) 10 MG 24 hr capsule [[id number]   Has the patient contacted their pharmacy? Yes    (Agent: If no, request that the patient contact the pharmacy for the refill.)   Preferred Pharmacy (with phone number or street name): [facility] [id number] - [place], [place] - [address] [facility] [address] & [address] [place] [address] Phone: [phone] Fax: [phone]     Agent: Please be advised that RX refills may take up to 3 business days. We ask that you follow-up with your pharmacy.

## 2017-05-23 NOTE — Telephone Encounter (Signed)
Refill request for  Adderall XR  Last refill was 03/23/17 for 60 tabs  LOV  03/23/17  Provider:  Norberto SorensonEva Shaw, MD  [facility] [address]  Please review.

## 2017-05-24 ENCOUNTER — Telehealth: Payer: Self-pay | Admitting: Family Medicine

## 2017-05-24 NOTE — Telephone Encounter (Signed)
Patient is requesting a refill of the following medications: Requested Prescriptions   Pending Prescriptions Disp Refills  . amphetamine-dextroamphetamine (ADDERALL XR) 10 MG 24 hr capsule 60 capsule 0    Sig: Take 1-2 capsules (10-20 mg total) by mouth 2 (two) times daily as needed (ADD).    Date of patient request: 05/22/2017 Last office visit: 03/23/2017 Date of last refill: 03/23/2017 Last refill amount: #60, 0 refills.  Follow up time period per chart: Adderall can be filled 1/10, 2/7, 3/7., return in 3 months (approx 06/21/2017)

## 2017-05-24 NOTE — Telephone Encounter (Signed)
Last OV: 03/23/17  PCP: Clelia CroftShaw [id number] [place] - [address] AT [facility] [address] & [address] [phone] (Phone) [phone] (Fax)

## 2017-05-24 NOTE — Telephone Encounter (Signed)
Copied from CRM [id number]. Topic: Quick Communication - Rx Refill/Question >> May 23, 2017  1:31 PM [name], Latoya M, [place] wrote: Medication: amphetamine-dextroamphetamine (ADDERALL XR) 10 MG 24 hr capsule [[id number]   Has the patient contacted their pharmacy? Yes    (Agent: If no, request that the patient contact the pharmacy for the refill.)   Preferred Pharmacy (with phone number or street name): [facility] [id number] - [place], [place] - [address] [facility] [address] & [address] [place] [id number] Phone: [phone] Fax: [phone]     Agent: Please be advised that RX refills may take up to 3 business days. We ask that you follow-up with your pharmacy. >> May 24, 2017  5:54 PM Raquel SarnaHayes, Teresa G wrote: Pt has been without Rx for 2 days. Needing refill ASAP.

## 2017-05-24 NOTE — Telephone Encounter (Signed)
Duplicate encounter, see refill encounter dated 05/22/17.

## 2017-05-25 ENCOUNTER — Telehealth: Payer: Self-pay | Admitting: Family Medicine

## 2017-05-25 NOTE — Telephone Encounter (Signed)
Patient called back about adderall refill request  on 05/25/17

## 2017-05-25 NOTE — Telephone Encounter (Signed)
Copied from CRM [id number]. Topic: Quick Communication - Rx Refill/Question >> May 25, 2017  6:39 PM Avie ArenasSimmons, Cloyce Paterson L, NT wrote: Medication:  amphetamine-dextroamphetamine (ADDERALL XR) 10 MG 24 hr capsule  Has the patient contacted their pharmacy? {yes  (Agent: If no, request that the patient contact the pharmacy for the refill.) Preferred Pharmacy (with phone number or street name): [facility] [id number] - [place], [place] - [address] [facility] [address] & [address] [phone] (Phone) [phone] (Fax Agent: Please be advised that RX refills may take up to 3 business days. We ask that you follow-up with your pharmacy.

## 2017-05-26 NOTE — Telephone Encounter (Signed)
Relation to pt: self  Call back number: [phone] [id number] [place], [place] - [address] [facility] [phone] (Phone) [phone] (Fax)     Reason for call:  Patient checking on the status of amphetamine-dextroamphetamine (ADDERALL XR) 10 MG 24 hr capsule refill. Patient been without since Monday and request was sent in on 05/24/17. Patient would like to resume back to 3 month supply and pick up hard copy, please advise

## 2017-05-26 NOTE — Telephone Encounter (Signed)
Pt requesting refill of Adderall.  Rx pended.   Patient is requesting a refill of the following medications: Requested Prescriptions   Pending Prescriptions Disp Refills  . amphetamine-dextroamphetamine (ADDERALL XR) 10 MG 24 hr capsule 60 capsule 0    Sig: Take 1-2 capsules (10-20 mg total) by mouth 2 (two) times daily as needed (ADD).    Date of patient request: 05/22/2017 Last office visit: for ADD 11/17/16, OV for acute visit 03/23/17 Date of last refill: 03/23/17 Last refill amount: #60 no refills  Follow up time period per chart: 06/21/2017

## 2017-05-26 NOTE — Telephone Encounter (Signed)
Pt wife Tristan SchroederLien states pt has been out of medication since Monday. Also states the pt will be [place] tomorrow b/c it should not take this long for a med refill.

## 2017-05-27 MED ORDER — AMPHETAMINE-DEXTROAMPHET ER 10 MG PO CP24: 10.0000 mg | ORAL_CAPSULE | Freq: Two times a day (BID) | ORAL | 0 refills | Status: DC | PRN

## 2017-05-27 NOTE — Telephone Encounter (Signed)
Sent to pharmacy 

## 2017-06-19 ENCOUNTER — Encounter: Payer: Self-pay | Admitting: Family Medicine

## 2017-06-19 ENCOUNTER — Other Ambulatory Visit: Payer: Self-pay

## 2017-06-19 ENCOUNTER — Ambulatory Visit (INDEPENDENT_AMBULATORY_CARE_PROVIDER_SITE_OTHER): Payer: BLUE CROSS/BLUE SHIELD | Admitting: Family Medicine

## 2017-06-19 VITALS — BP 118/80 | HR 94 | Temp 98.0°F | Resp 16 | Ht 70.0 in | Wt 249.4 lb

## 2017-06-19 DIAGNOSIS — G4700 Insomnia, unspecified: Secondary | ICD-10-CM

## 2017-06-19 DIAGNOSIS — F902 Attention-deficit hyperactivity disorder, combined type: Secondary | ICD-10-CM | POA: Diagnosis not present

## 2017-06-19 DIAGNOSIS — Z79899 Other long term (current) drug therapy: Secondary | ICD-10-CM

## 2017-06-19 DIAGNOSIS — Z5181 Encounter for therapeutic drug level monitoring: Secondary | ICD-10-CM

## 2017-06-19 DIAGNOSIS — F411 Generalized anxiety disorder: Secondary | ICD-10-CM

## 2017-06-19 MED ORDER — AMPHETAMINE-DEXTROAMPHET ER 5 MG PO CP24: ORAL_CAPSULE | ORAL | 0 refills | Status: DC

## 2017-06-19 MED ORDER — METHYLPHENIDATE HCL ER 18 MG PO TB24: 18.0000 mg | ORAL_TABLET | Freq: Every day | ORAL | 0 refills | Status: DC

## 2017-06-19 MED ORDER — CLONAZEPAM 1 MG PO TABS: 1.0000 mg | ORAL_TABLET | Freq: Three times a day (TID) | ORAL | 2 refills | Status: DC | PRN

## 2017-06-19 NOTE — Progress Notes (Signed)
Subjective:  By signing my name below, I, Stann Ore, attest that this documentation has been prepared under the direction and in the presence of [name], MD. Electronically Signed: Stann Ore, Scribe. 06/19/2017 , 6:10 PM .  Patient was seen in Room 3 .   Patient ID: Jose Shaffer, male    DOB: 26-Jun-1970, 47 y.o.   MRN: [id number] Chief Complaint  Patient presents with  . Medication Refill    adderall, klonopin, flonase.  Wants to discuss medications   HPI Jose Shaffer is a 47 y.o. male who presents to Primary Care at [facility] for medication refill. Patient was seen for office visit on 03/23/17, and had 3 prescriptions for Adderall XR were sent to pharmacy and confirmed as received. Yet, drug database showed only the first was filled on 04/22/17. Clonazepam filled 3 times, last was 05/22/17.   Patient states he wasn't able to work well when off of Adderall for a week. 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 When he was taking Adderall with klonopin, he felt klonopin seemed dulled and felt more fatigued with higher anxiety. After he restarted Adderall, he felt better initially, but then smoothed back out until he felt like he was on Adderall 30mg , "feeling more zombie-like". He was previously on Concerta 36mg  and up to 54mg  QD. He is still using CPAP at night for sleep apnea.   Past Medical History:  Diagnosis Date  . ADD (attention deficit disorder)   . Allergy   . Anxiety state, unspecified 03/10/2013  . Diverticulitis   . Insomnia   . Sleep apnea    Past Surgical History:  Procedure Laterality Date  . INGUINAL HERNIA REPAIR Bilateral 01/22/2015   Procedure: LAPAROSCOPIC BILATERAL INGUINAL HERNIA REPAIR WITH MESH AND UMBILICAL HERNIA REPAIR;  Surgeon: Abigail Miyamoto, MD;  Location: [facility];  Service: General;  Laterality: Bilateral;  . QUADRICEPS TENDON REPAIR  08/2008  . UMBILICAL HERNIA REPAIR N/A 01/22/2015   Procedure: UMBILICAL HERNIA REPAIR;  Surgeon: Abigail Miyamoto, MD;  Location: [facility];  Service: General;  Laterality: N/A;   Prior to Admission medications   Medication Sig Start Date End Date Taking? Authorizing Provider  amphetamine-dextroamphetamine (ADDERALL XR) 10 MG 24 hr capsule Take 1-2 capsules (10-20 mg total) by mouth 2 (two) times daily as needed (ADD). 05/27/17   Sherren Mocha, MD  clonazePAM (KLONOPIN) 1 MG tablet Take 1 tablet (1 mg total) by mouth 3 (three) times daily as needed for anxiety. 03/23/17   Sherren Mocha, MD  fluticasone (FLONASE) 50 MCG/ACT nasal spray Place 1 spray into both nostrils daily.    [provider]  Multiple Vitamin (MULTIVITAMIN) tablet Take 1 tablet by mouth daily.    [provider]  naproxen sodium (ALEVE) 220 MG tablet Take 220 mg by mouth daily as needed.    [provider]   Allergies  Allergen Reactions  . Ibuprofen Other (See Comments)    Per patient feels like KNOT in throat and itching with rash  . Tramadol Anxiety   Family History  Problem Relation Age of Onset  . Hypertension Father   . Colon polyps Father   . Cancer Maternal Grandmother 27       breast cancer  . Hypertension Paternal Grandmother   . COPD Paternal Grandfather        lung cancer; tobacco use, Doctor, general practice  . Cancer Maternal Aunt  breast cancer, late 101's  . Breast cancer Maternal Aunt   . Cancer Maternal Aunt        breast cancer; diagnosed late 37's  . Breast cancer Maternal Aunt   . Colon cancer Neg Hx   . Rectal cancer Neg Hx   . Stomach cancer Neg Hx   . Esophageal cancer Neg Hx    Social History   Socioeconomic History  . Marital status: Married    Spouse name: Tristan Schroeder  . Number of children: 0  . Years of education: 12+  . Highest education level: Not on file  Occupational History  . Occupation: truck Air traffic controller: SELF-EMPLOYED  . Occupation: Air cabin crew  . Financial resource  strain: Not on file  . Food insecurity:    Worry: Not on file    Inability: Not on file  . Transportation needs:    Medical: Not on file    Non-medical: Not on file  Tobacco Use  . Smoking status: Never Smoker  . Smokeless tobacco: Never Used  Substance and Sexual Activity  . Alcohol use: Yes    Comment: rare   . Drug use: No  . Sexual activity: Yes    Partners: Female    Birth control/protection: None  Lifestyle  . Physical activity:    Days per week: Not on file    Minutes per session: Not on file  . Stress: Not on file  Relationships  . Social connections:    Talks on phone: Not on file    Gets together: Not on file    Attends religious service: Not on file    Active member of club or organization: Not on file    Attends meetings of clubs or organizations: Not on file    Relationship status: Not on file  Other Topics Concern  . Not on file  Social History Narrative   Lives with wife, Tristan Schroeder   Caffeine 1 cup avg twice weekly   Depression screen [facility] 2/9 06/19/2017 03/23/2017 11/17/2016 11/07/2016 11/04/2016  Decreased Interest 0 0 0 0 0  Down, Depressed, Hopeless 0 0 0 0 0  PHQ - 2 Score 0 0 0 0 0    Review of Systems  Constitutional: Negative for fatigue and unexpected weight change.  Eyes: Negative for visual disturbance.  Respiratory: Negative for cough, chest tightness and shortness of breath.   Cardiovascular: Negative for chest pain, palpitations and leg swelling.  Gastrointestinal: Negative for abdominal pain and blood in stool.  Neurological: Negative for dizziness, light-headedness and headaches.  Psychiatric/Behavioral: Positive for decreased concentration.       Objective:   Physical Exam  Constitutional: He is oriented to person, place, and time. He appears well-developed and well-nourished. No distress.  HENT:  Head: Normocephalic and atraumatic.  Eyes: Pupils are equal, round, and reactive to light. EOM are normal.  Neck: Neck supple.  Cardiovascular:  Normal rate.  Pulmonary/Chest: Effort normal. No respiratory distress.  Musculoskeletal: Normal range of motion.  Neurological: He is alert and oriented to person, place, and time.  Skin: Skin is warm and dry.  Psychiatric: He has a normal mood and affect. His behavior is normal.  Nursing note and vitals reviewed.   BP 118/80 (BP Location: Right Arm, Patient Position: Sitting, Cuff Size: Large)   Pulse 94   Temp 98 F (36.7 C) (Oral)   Resp 16   Ht 5\' 10"  (1.778 m)   Wt 249 lb 6.4 oz (113.1 kg)  SpO2 96%   BMI 35.79 kg/m      Assessment & Plan:   1. High risk medications (not anticoagulants) long-term use   2. Medication monitoring encounter   3. Attention deficit hyperactivity disorder (ADHD), combined type   4. Anxiety state   5. Insomnia, unspecified type   Cross taper off of adderall than do re-trial of concerta  Orders Placed This Encounter  Procedures  . ToxASSURE Select 13 (MW), Urine    Meds ordered this encounter  Medications  . amphetamine-dextroamphetamine (ADDERALL XR) 5 MG 24 hr capsule    Sig: Take 2 tabs po qam and 1 tab qafternoon x 10d, then 2 tabs po qam x 10d, then 1 tab po qam    Dispense:  60 capsule    Refill:  0    Using to taper off of Adderall XR while cross tapering onto methylphenidate CR  . methylphenidate 18 MG PO CR tablet    Sig: Take 1 tablet (18 mg total) by mouth daily. And can repeat x 1 before lunch if needed    Dispense:  60 tablet    Refill:  0  . clonazePAM (KLONOPIN) 1 MG tablet    Sig: Take 1 tablet (1 mg total) by mouth 3 (three) times daily as needed for anxiety.    Dispense:  90 tablet    Refill:  2    I personally performed the services described in this documentation, which was scribed in my presence. The recorded information has been reviewed and considered, and addended by me as needed.   [name], M.D.  Primary Care at [facility] [address] [place], [place] [address] [phone] phone [phone] fax  07/27/17 8:01 PM

## 2017-06-19 NOTE — Patient Instructions (Addendum)
IF you received an x-ray today, you will receive an invoice from [facility]. Please contact [facility] at [phone] with questions or concerns regarding your invoice.   IF you received labwork today, you will receive an invoice from [facility]. Please contact [facility] at [phone] with questions or concerns regarding your invoice.   Our billing staff will not be able to assist you with questions regarding bills from these companies.  You will be contacted with the lab results as soon as they are available. The fastest way to get your results is to activate your My Chart account. Instructions are located on the last page of this paperwork. If you have not heard from [place] regarding the results in 2 weeks, please contact this office.    I DO NOT THINK YOU HAVE THIS - JUST SHOWING TO YOU THAT I DON'T THINK YOU NEED TO WORRY. Stimulant Use Disorder-Amphetamines Amphetamines belong to a group of powerful drugs known as stimulants. Common street names for amphetamines include speed and crank. Amphetamines have a number of medical uses, but they are often misused because of the effects that they produce. These effects include:  A feeling of extreme pleasure (euphoria).  Alertness.  Increased attention.  A high energy level.  Loss of appetite.  Stimulant use disorder is when your stimulant use disrupts your daily life. It may disrupt your relationships and how you do your job. Stimulant use disorder can be dangerous. Amphetamines increase your blood pressure and heart rate. Using them can lead to a heart attack or stroke. Amphetamines can also make your heart rate irregular, cause seizures, and raise your body temperature. These problems can lead to death. What are the causes? This condition is caused by misusing amphetamines for a period of time, such as by taking them for reasons other than to treat a diagnosed problem. Many people start using amphetamines because they  make them feel good. Over time, they get addicted to them. When they try to stop using them, they feel sick. What increases the risk? This condition is more likely to develop in people who:  Misuse other drugs.  Have problems with mood or behavior.  What are the signs or symptoms? Symptoms of this condition include:  Using greater amounts of an amphetamine than you want to, or using an amphetamine for longer than you want to.  Trying several times to use less of an amphetamine or to control your amphetamine use.  Craving amphetamines.  Spending a lot of time getting amphetamines, using them, or recovering from their effects.  Having problems at work, at school, at home, or in relationships because of amphetamine use.  Giving up or cutting down on important life activities because of amphetamine use.  Using amphetamines when it is dangerous, such as when driving a car.  Continuing to use amphetamines even though they are causing or have led to a physical problem, such as: ? Unintended weight loss. ? High blood pressure. ? Chest pain. ? An infection, such as hepatitis or HIV (human immunodeficiency virus).  Continuing to use amphetamines even though they are causing a mental problem, such as: ? Anxiety. ? Sleep problems. ? Schizophrenia-like symptoms. ? Depression. ? Bipolar mood swings. ? Violent behavior.  Needing more and more of an amphetamine to get the same effect that you want (building up a tolerance).  Having symptoms of withdrawal when you stop using an amphetamine. Symptoms of withdrawal include: ? Headache. ? Chills. ? Palpitations or an irregular heart beat. ?  Changes in blood pressure. ? Chest pain. ? Dry mouth or changes in taste. ? Cramping in the abdomen. ? Nausea, vomiting, or diarrhea. ? Mood changes. ? Fatigue. ? Sleep changes or bad dreams. ? Increased appetite. ? Restlessness.  How is this diagnosed? This condition is diagnosed with an  assessment. During the assessment, your health care provider will ask about your amphetamine use and about how it affects your life. You will be diagnosed with the condition if you have had at least two symptoms of this condition within a 10108-month period. How severe the condition is depends on how many symptoms you have:  If you have 2 or 3 symptoms, your condition is mild.  If you have 4 or 5 symptoms, your condition is moderate.  If you have 6 or more symptoms, your condition is severe.  Your health care provider may perform a physical exam or do lab tests to see if you have physical problems resulting from amphetamine use. Your health care provider may also screen for drug use and refer you to a mental health professional for evaluation. How is this treated? There are two types of treatment for this condition:  Short-term medical treatment. This type helps you live your life. It helps prevent or minimize damage from any physical or mental problems that are related to your amphetamine use.  Long-term substance abuse treatment. This type helps you stop using amphetamines. It is usually provided by mental health professionals with training in substance use disorders. It usually involves a combination of the following: ? Counseling. This treatment is also called talk therapy. It is provided by substance use treatment counselors. A counselor can address the reasons you use amphetamines and suggest ways to keep you from using amphetamines again. The goals of talk therapy are to find healthy activities and ways to cope with stress, identify and avoid what triggers your amphetamine use, and help you learn how to handle cravings. ? Support groups. Support groups are run by people who have quit using stimulants. They provide emotional support, advice, and guidance. ? Medicine. Medicines may be prescribed to reduce cravings or to block the good feeling that you get from using amphetamines.  Follow these  instructions at home:  Take over-the-counter and prescription medicines only as told by your health care provider.  Check with your health care provider before starting any new medicines.  Keep all follow-up visits as told by your health care provider. This is important. Where to find more information:  [employer] on Drug Abuse: http://www.price-smith.com/www.drugabuse.gov  Substance Abuse and Mental Health Services Administration: SkateOasis.com.ptwww.samhsa.gov Contact a health care provider if:  Your symptoms get worse.  You use amphetamines again.  You are not able to take medicines as told. Get help right away if:  You have serious thoughts about hurting yourself or others.  You have a seizure.  You have chest pain.  You have sudden weakness.  You lose some of your vision.  You lose some of your speech. If you ever feel like you may hurt yourself or others, or have thoughts about taking your own life, get help right away. You can go to your nearest emergency department or call:  Your local emergency services (911 in the [place]  A suicide crisis helpline, such as the National Suicide Prevention Lifeline at 706-277-09661-209-677-3889. This is open 24 hours a day.  This information is not intended to replace advice given to you by your health care provider. Make sure you discuss any questions  you have with your health care provider. Document Released: 02/22/2001 Document Revised: 12/11/2015 Document Reviewed: 12/11/2015 Elsevier Interactive Patient Education  [employer].

## 2017-06-23 LAB — TOXASSURE SELECT 13 (MW), URINE

## 2017-07-24 ENCOUNTER — Other Ambulatory Visit: Payer: Self-pay | Admitting: Family Medicine

## 2017-07-24 NOTE — Telephone Encounter (Signed)
Copied from CRM [id number]. Topic: Quick Communication - Rx Refill/Question >> Jul 24, 2017  3:56 PM Arlyss Gandy, NT wrote: Medication:   methylphenidate 18 MG PO CR tablet  Has the patient contacted their pharmacy? Yes.   (Agent: If no, request that the patient contact the pharmacy for the refill.) Preferred Pharmacy (with phone number or street name): [id number] - [place],  - [address] AT [address] [phone] (Phone) [phone] (Fax)     Agent: Please be advised that RX refills may take up to 3 business days. We ask that you follow-up with your pharmacy.

## 2017-07-25 NOTE — Telephone Encounter (Signed)
LOV 06/19/17   Dr. Clelia Croft Last refill 06/19/17 # 60  0 refill

## 2017-07-25 NOTE — Addendum Note (Signed)
Addended by: Ledon Snare A on: 07/25/2017 01:15 PM   Modules accepted: Orders

## 2017-07-27 MED ORDER — METHYLPHENIDATE HCL ER 18 MG PO TB24: 18.0000 mg | ORAL_TABLET | Freq: Every day | ORAL | 0 refills | Status: DC

## 2017-08-29 ENCOUNTER — Telehealth: Payer: Self-pay | Admitting: Family Medicine

## 2017-08-29 DIAGNOSIS — F411 Generalized anxiety disorder: Secondary | ICD-10-CM

## 2017-08-29 MED ORDER — CLONAZEPAM 1 MG PO TABS: 1.0000 mg | ORAL_TABLET | Freq: Three times a day (TID) | ORAL | 0 refills | Status: DC | PRN

## 2017-08-29 MED ORDER — METHYLPHENIDATE HCL ER 18 MG PO TB24: 18.0000 mg | ORAL_TABLET | Freq: Every day | ORAL | 0 refills | Status: DC

## 2017-08-29 NOTE — Telephone Encounter (Signed)
Copied from CRM [id number]. Topic: Quick Communication - Rx Refill/Question >> Aug 28, 2017  3:53 PM [name], Jose W wrote: Medication   clonazePAM (KLONOPIN) 1 MG tablet                  methylphenidate 18 MG PO CR tablet      Preferred Pharmacy  [facility] [address]  Agent: Please be advised that RX refills may take up to 3 business days. We ask that you follow-up with your pharmacy.

## 2017-08-29 NOTE — Addendum Note (Signed)
Addended by: Sherren MochaSHAW, Dorine Duffey N on: 08/29/2017 11:31 AM   Modules accepted: Orders

## 2017-08-29 NOTE — Telephone Encounter (Signed)
Sent in 1 mo of refills but pt needs f/u OV before additional refills

## 2017-09-16 ENCOUNTER — Other Ambulatory Visit: Payer: Self-pay | Admitting: Family Medicine

## 2017-09-16 DIAGNOSIS — F411 Generalized anxiety disorder: Secondary | ICD-10-CM

## 2017-09-18 ENCOUNTER — Telehealth: Payer: Self-pay | Admitting: Family Medicine

## 2017-09-18 NOTE — Telephone Encounter (Signed)
Copied from CRM [phone]. Topic: Quick Communication - Rx Refill/Question >> Sep 18, 2017  6:38 PM Alexander BergeronBarksdale, Jose B wrote: Medication: clonazePAM (KLONOPIN) 1 MG tablet [[id number]][[id number]]   Has the patient contacted their pharmacy? Yes.   (Agent: If no, request that the patient contact the pharmacy for the refill.) (Agent: If yes, when and what did the pharmacy advise?)  [facility]  Agent: Please be advised that RX refills may take up to 3 business days. We ask that you follow-up with your pharmacy.

## 2017-09-21 ENCOUNTER — Other Ambulatory Visit: Payer: Self-pay | Admitting: Family Medicine

## 2017-09-21 DIAGNOSIS — F411 Generalized anxiety disorder: Secondary | ICD-10-CM

## 2017-09-21 NOTE — Telephone Encounter (Signed)
Pt has been out of med for 3 or 4 day

## 2017-09-21 NOTE — Telephone Encounter (Signed)
Pt calling back regarding refill of Clonazepam. Pt spoke with pharmacy prior to call and prescription was not received.  [facility] and spoke with the pharmacist Zonia Kiefnik, who states that prescription sent on 08/29/17 to be filled on 09/17/17 was never received. [facility] [place]

## 2017-09-22 ENCOUNTER — Other Ambulatory Visit: Payer: Self-pay

## 2017-09-22 ENCOUNTER — Telehealth: Payer: Self-pay

## 2017-09-22 DIAGNOSIS — F411 Generalized anxiety disorder: Secondary | ICD-10-CM

## 2017-09-22 MED ORDER — CLONAZEPAM 1 MG PO TABS: 1.0000 mg | ORAL_TABLET | Freq: Three times a day (TID) | ORAL | 0 refills | Status: DC | PRN

## 2017-09-22 NOTE — Telephone Encounter (Signed)
See phone note- 09/18/17  Resend Rx to pharmacy- they did receive Rx.

## 2017-09-22 NOTE — Telephone Encounter (Signed)
Recd fax Telephone advice record. [facility].  Called & spoke to pharmacist Gave verbal order for Clonazepam as in chart sent 7/7/ 2019 by Dr Clelia CroftShaw. Pharmacy states they did not get.  Attempted to send electronically- printed instead x 2 - destroyed.   Pharmacy accepted my verbal order from Dr. Clelia CroftSHaw

## 2017-09-26 NOTE — Telephone Encounter (Signed)
Thank you :)

## 2017-09-27 ENCOUNTER — Ambulatory Visit (INDEPENDENT_AMBULATORY_CARE_PROVIDER_SITE_OTHER): Payer: BLUE CROSS/BLUE SHIELD | Admitting: Family Medicine

## 2017-09-27 ENCOUNTER — Encounter: Payer: Self-pay | Admitting: Family Medicine

## 2017-09-27 ENCOUNTER — Other Ambulatory Visit: Payer: Self-pay

## 2017-09-27 VITALS — BP 128/84 | HR 83 | Temp 98.1°F | Ht 70.0 in | Wt 247.0 lb

## 2017-09-27 DIAGNOSIS — F902 Attention-deficit hyperactivity disorder, combined type: Secondary | ICD-10-CM

## 2017-09-27 DIAGNOSIS — F411 Generalized anxiety disorder: Secondary | ICD-10-CM

## 2017-09-27 MED ORDER — METHYLPHENIDATE HCL ER 18 MG PO TB24: 18.0000 mg | ORAL_TABLET | Freq: Every day | ORAL | 0 refills | Status: DC

## 2017-09-27 MED ORDER — CLONAZEPAM 1 MG PO TABS: 1.0000 mg | ORAL_TABLET | Freq: Three times a day (TID) | ORAL | 2 refills | Status: DC | PRN

## 2017-09-27 MED ORDER — METHYLPHENIDATE HCL ER (OSM) 18 MG PO TBCR: 18.0000 mg | EXTENDED_RELEASE_TABLET | Freq: Every day | ORAL | 0 refills | Status: DC

## 2017-09-27 NOTE — Patient Instructions (Signed)
     IF you received an x-ray today, you will receive an invoice from [facility]. Please contact [facility] at [phone] with questions or concerns regarding your invoice.   IF you received labwork today, you will receive an invoice from [facility]. Please contact [facility] at [phone] with questions or concerns regarding your invoice.   Our billing staff will not be able to assist you with questions regarding bills from these companies.  You will be contacted with the lab results as soon as they are available. The fastest way to get your results is to activate your My Chart account. Instructions are located on the last page of this paperwork. If you have not heard from us regarding the results in 2 weeks, please contact this office.     

## 2017-09-27 NOTE — Progress Notes (Signed)
Subjective:  By signing my name below, I, Stann Ore, attest that this documentation has been prepared under the direction and in the presence of [name], MD. Electronically Signed: Stann Ore, Scribe. 09/27/2017 , 12:25 PM .  Patient was seen in Room 2 .   Patient ID: Jose Shaffer, male    DOB: 10/13/1970, 47 y.o.   MRN: [id number] Chief Complaint  Patient presents with  . Medication Refill    Concerta. wants to talk about refills.    HPI Jose Shaffer is a 47 y.o. male who presents to Primary Care at [facility] for medication refills. He had recently received klonopin for 1 month, no refills. Patient states he's doing well on Concerta 18 mg tablets, and will need refill on the 22nd (in 5 days). He's been taking 2 tablets (36 mg) on weekday mornings. He mentions mostly needing just 1 tablet (18 mg) on weekend mornings.   His Concerta was filled on 4/13, 5/20 and 6/22. His klonopin was filled 4/8, 5/11, 6/8 and 7/12.   Past Medical History:  Diagnosis Date  . ADD (attention deficit disorder)   . Allergy   . Anxiety state, unspecified 03/10/2013  . Diverticulitis   . Insomnia   . Sleep apnea    Past Surgical History:  Procedure Laterality Date  . INGUINAL HERNIA REPAIR Bilateral 01/22/2015   Procedure: LAPAROSCOPIC BILATERAL INGUINAL HERNIA REPAIR WITH MESH AND UMBILICAL HERNIA REPAIR;  Surgeon: Abigail Miyamoto, MD;  Location: [facility];  Service: General;  Laterality: Bilateral;  . QUADRICEPS TENDON REPAIR  08/2008  . UMBILICAL HERNIA REPAIR N/A 01/22/2015   Procedure: UMBILICAL HERNIA REPAIR;  Surgeon: Abigail Miyamoto, MD;  Location: [facility];  Service: General;  Laterality: N/A;   Prior to Admission medications   Medication Sig Start Date End Date Taking? Authorizing Provider  clonazePAM (KLONOPIN) 1 MG tablet Take 1 tablet (1 mg total) by mouth 3 (three) times daily as needed for anxiety. 09/22/17   Sherren Mocha, MD    fluticasone (FLONASE) 50 MCG/ACT nasal spray Place 1 spray into both nostrils daily.    [provider]  methylphenidate 18 MG PO CR tablet Take 1 tablet (18 mg total) by mouth daily. And can repeat x 1 before lunch if needed 08/29/17   Sherren Mocha, MD  Multiple Vitamin (MULTIVITAMIN) tablet Take 1 tablet by mouth daily.    [provider]  naproxen sodium (ALEVE) 220 MG tablet Take 220 mg by mouth daily as needed.    [provider]   Allergies  Allergen Reactions  . Ibuprofen Other (See Comments)    Per patient feels like KNOT in throat and itching with rash  . Tramadol Anxiety   Family History  Problem Relation Age of Onset  . Hypertension Father   . Colon polyps Father   . Cancer Maternal Grandmother 17       breast cancer  . Hypertension Paternal Grandmother   . COPD Paternal Grandfather        lung cancer; tobacco use, Doctor, general practice  . Cancer Maternal Aunt        breast cancer, late 78's  . Breast cancer Maternal Aunt   . Cancer Maternal Aunt        breast cancer; diagnosed late 46's  . Breast cancer Maternal Aunt   . Colon cancer Neg Hx   . Rectal cancer Neg Hx   . Stomach cancer Neg Hx   . Esophageal cancer Neg  Hx    Social History   Socioeconomic History  . Marital status: Married    Spouse name: Tristan Schroeder  . Number of children: 0  . Years of education: 12+  . Highest education level: Not on file  Occupational History  . Occupation: truck Air traffic controller: SELF-EMPLOYED  . Occupation: Air cabin crew  . Financial resource strain: Not on file  . Food insecurity:    Worry: Not on file    Inability: Not on file  . Transportation needs:    Medical: Not on file    Non-medical: Not on file  Tobacco Use  . Smoking status: Never Smoker  . Smokeless tobacco: Never Used  Substance and Sexual Activity  . Alcohol use: Yes    Comment: rare   . Drug use: No  . Sexual activity: Yes    Partners: Female    Birth  control/protection: None  Lifestyle  . Physical activity:    Days per week: Not on file    Minutes per session: Not on file  . Stress: Not on file  Relationships  . Social connections:    Talks on phone: Not on file    Gets together: Not on file    Attends religious service: Not on file    Active member of club or organization: Not on file    Attends meetings of clubs or organizations: Not on file    Relationship status: Not on file  Other Topics Concern  . Not on file  Social History Narrative   Lives with wife, Tristan Schroeder   Caffeine 1 cup avg twice weekly   Depression screen [facility] 2/9 09/27/2017 06/19/2017 03/23/2017 11/17/2016 11/07/2016  Decreased Interest 0 0 0 0 0  Down, Depressed, Hopeless 0 0 0 0 0  PHQ - 2 Score 0 0 0 0 0    Review of Systems  Constitutional: Negative for fatigue and unexpected weight change.  Eyes: Negative for visual disturbance.  Respiratory: Negative for cough, chest tightness and shortness of breath.   Cardiovascular: Negative for chest pain, palpitations and leg swelling.  Gastrointestinal: Negative for abdominal pain and blood in stool.  Neurological: Negative for dizziness, light-headedness and headaches.       Objective:   Physical Exam  Constitutional: He is oriented to person, place, and time. He appears well-developed and well-nourished. No distress.  HENT:  Head: Normocephalic and atraumatic.  Eyes: Pupils are equal, round, and reactive to light. EOM are normal.  Neck: Neck supple.  Cardiovascular: Normal rate.  Pulmonary/Chest: Effort normal. No respiratory distress.  Musculoskeletal: Normal range of motion.  Neurological: He is alert and oriented to person, place, and time.  Skin: Skin is warm and dry.  Psychiatric: He has a normal mood and affect. His behavior is normal.  Nursing note and vitals reviewed.   BP 128/84 (BP Location: Left Arm, Patient Position: Sitting, Cuff Size: Large)   Pulse 83   Temp 98.1 F (36.7 C) (Oral)   Ht 5'  10" (1.778 m)   Wt 247 lb (112 kg)   SpO2 97%   BMI 35.44 kg/m      Assessment & Plan:   1. Attention deficit hyperactivity disorder (ADHD), combined type   2. Anxiety state     Meds ordered this encounter  Medications  . clonazePAM (KLONOPIN) 1 MG tablet    Sig: Take 1 tablet (1 mg total) by mouth 3 (three) times daily as needed for anxiety.    Dispense:  90 tablet    Refill:  2  . methylphenidate 18 MG PO CR tablet    Sig: Take 1 tablet (18 mg total) by mouth daily. Repeat x 1 before lunch if needed    Dispense:  60 tablet    Refill:  0  . methylphenidate 18 MG PO CR tablet    Sig: Take 1 tablet (18 mg total) by mouth daily. Repeat x 1 before lunch if needed    Dispense:  60 tablet    Refill:  0  . methylphenidate 18 MG PO CR tablet    Sig: Take 1 tablet (18 mg total) by mouth daily. Repeat x 1 before lunch if needed    Dispense:  60 tablet    Refill:  0   I personally performed the services described in this documentation, which was scribed in my presence. The recorded information has been reviewed and considered, and addended by me as needed.   Norberto SorensonEva Shaw, M.D.  Primary Care at [facility]  [place] [address] [place], [place] [address] [phone] phone [phone] fax  12/30/17 11:31 PM

## 2017-11-23 ENCOUNTER — Telehealth: Payer: Self-pay | Admitting: Family Medicine

## 2017-11-23 NOTE — Telephone Encounter (Signed)
Called pt. To reschedule visit with Dr. Shaw on 12/18/17. Rescheduled with pt.  °

## 2017-12-18 ENCOUNTER — Encounter: Payer: Self-pay | Admitting: Family Medicine

## 2017-12-18 ENCOUNTER — Ambulatory Visit: Payer: BLUE CROSS/BLUE SHIELD | Admitting: Family Medicine

## 2017-12-18 ENCOUNTER — Other Ambulatory Visit: Payer: Self-pay

## 2017-12-18 ENCOUNTER — Ambulatory Visit (INDEPENDENT_AMBULATORY_CARE_PROVIDER_SITE_OTHER): Payer: BLUE CROSS/BLUE SHIELD | Admitting: Family Medicine

## 2017-12-18 VITALS — BP 147/87 | HR 95 | Temp 97.6°F | Ht 70.25 in | Wt 253.8 lb

## 2017-12-18 DIAGNOSIS — F902 Attention-deficit hyperactivity disorder, combined type: Secondary | ICD-10-CM

## 2017-12-18 DIAGNOSIS — Z79899 Other long term (current) drug therapy: Secondary | ICD-10-CM

## 2017-12-18 DIAGNOSIS — Z5181 Encounter for therapeutic drug level monitoring: Secondary | ICD-10-CM | POA: Diagnosis not present

## 2017-12-18 DIAGNOSIS — Z23 Encounter for immunization: Secondary | ICD-10-CM | POA: Diagnosis not present

## 2017-12-18 NOTE — Progress Notes (Signed)
10/7/20198:41 AM  Jose Shaffer 1971-02-04, 47 y.o. male [id number]  Chief Complaint  Patient presents with  . Medication Management    wanting to increase his meds, has been on current dose for yrs    HPI:   Patient is a 47 y.o. male with past medical history significant for ADHD, anxiety and insomnia who presents today for followup  PCP Dr Clelia Croft Last appt July 2019  PMP reviewed, last rx refills for concerta and clonazepam 12/05/17  Has had issues on not getting his prescription on time Therefore he gets paper rx He gets very irritable and miserable when concerta 36mg  starts wearing off around 2pm, feels like he is crashing He uses clonazepam to help bridge those symptoms, takes 2mg  around noon and then might take another 1mg  aorund 4pm Notices that his afternoons and weekends are shot, as he is so exhausted Has also noticed worsening memory Used to be on aderral XR, did better on that, but started to feel it was losing its effectivity vyvanse made him very sleepy  Has problems with insomnia  Has periods when his focus is on point, intermittent periods of brain feels like mud  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 No No No No No  Number falls in past yr: - - - - -     Depression screen [facility] 2/9 12/18/2017 09/27/2017 06/19/2017  Decreased Interest 0 0 0  Down, Depressed, Hopeless 0 0 0  PHQ - 2 Score 0 0 0    Allergies  Allergen Reactions  . Ibuprofen Other (See Comments)    Per patient feels like KNOT in throat and itching with rash  . Tramadol Anxiety    Prior to Admission medications   Medication Sig Start Date End Date Taking? Authorizing Provider  clonazePAM (KLONOPIN) 1 MG tablet Take 1 tablet (1 mg total) by mouth 3 (three) times daily as needed for anxiety. 09/27/17   Sherren Mocha, MD    fluticasone (FLONASE) 50 MCG/ACT nasal spray Place 1 spray into both nostrils daily.    [provider]  methylphenidate 18 MG PO CR tablet Take 1 tablet (18 mg total) by mouth daily. Repeat x 1 before lunch if needed 09/27/17   Sherren Mocha, MD  methylphenidate 18 MG PO CR tablet Take 1 tablet (18 mg total) by mouth daily. Repeat x 1 before lunch if needed 10/25/17   Sherren Mocha, MD  Multiple Vitamin (MULTIVITAMIN) tablet Take 1 tablet by mouth daily.    [provider]  naproxen sodium (ALEVE) 220 MG tablet Take 220 mg by mouth daily as needed.    [provider]    Past Medical History:  Diagnosis Date  . ADD (attention deficit disorder)   . Allergy   . Anxiety state, unspecified 03/10/2013  . Diverticulitis   . Insomnia   . Sleep apnea     Past Surgical History:  Procedure Laterality Date  . INGUINAL HERNIA REPAIR Bilateral 01/22/2015   Procedure: LAPAROSCOPIC BILATERAL INGUINAL HERNIA REPAIR WITH MESH AND UMBILICAL HERNIA REPAIR;  Surgeon: Abigail Miyamoto, MD;  Location: [facility];  Service: General;  Laterality: Bilateral;  . QUADRICEPS TENDON REPAIR  08/2008  . UMBILICAL HERNIA REPAIR N/A 01/22/2015   Procedure: UMBILICAL HERNIA REPAIR;  Surgeon: Abigail Miyamoto, MD;  Location: [facility];  Service: General;  Laterality: N/A;    Social History   Tobacco Use  . Smoking status: Never Smoker  . Smokeless tobacco: Never Used  Substance Use Topics  . Alcohol use: Yes    Comment: rare     Family History  Problem Relation Age of Onset  . Hypertension Father   . Colon polyps Father   . Cancer Maternal Grandmother 68       breast cancer  . Hypertension Paternal Grandmother   . COPD Paternal Grandfather        lung cancer; tobacco use, Doctor, general practice  . Cancer Maternal Aunt        breast cancer, late 46's  . Breast cancer Maternal Aunt   . Cancer Maternal Aunt        breast cancer; diagnosed late 50's  .  Breast cancer Maternal Aunt   . Colon cancer Neg Hx   . Rectal cancer Neg Hx   . Stomach cancer Neg Hx   . Esophageal cancer Neg Hx     ROS Per hpi  OBJECTIVE:  Blood pressure (!) 147/87, pulse 95, temperature 97.6 F (36.4 C), temperature source Oral, height 5' 10.25" (1.784 m), weight 253 lb 12.8 oz (115.1 kg), SpO2 95 %. Body mass index is 36.16 kg/m.   Wt Readings from Last 3 Encounters:  12/18/17 253 lb 12.8 oz (115.1 kg)  09/27/17 247 lb (112 kg)  06/19/17 249 lb 6.4 oz (113.1 kg)   BP Readings from Last 3 Encounters:  12/18/17 (!) 147/87  09/27/17 128/84  06/19/17 118/80    Physical Exam  Constitutional: He is oriented to person, place, and time. He appears well-developed and well-nourished.  HENT:  Head: Normocephalic and atraumatic.  Mouth/Throat: Oropharynx is clear and moist.  Eyes: Pupils are equal, round, and reactive to light. Conjunctivae and EOM are normal.  Neck: Neck supple.  Pulmonary/Chest: Effort normal.  Neurological: He is alert and oriented to person, place, and time.  Skin: Skin is warm and dry.  Psychiatric: He has a normal mood and affect.  Nursing note and vitals reviewed.   ASSESSMENT and PLAN  1. Attention deficit hyperactivity disorder (ADHD), combined type 2. Encounter for long term benzodiazepine therapy 3. Encounter for medication monitoring - ToxASSURE Select 13 (MW), Urine  More than 50% of this 25 min visit was spent discussing, providing counseling regarding medications, side effects and diagnosis. I had avery frank conversation with him that I think he is over medicated at this time. He is using medications to address side effects from medications. Discussed weaning off completely from clonzaepam, schedule given. Discussed ssx of concern. Also discussed cutting back on concerta dose, given such significant crash. Discussed once off meds, then we can see what symptoms if any remain.   4. Need for prophylactic vaccination and  inoculation against influenza - Flu Vaccine QUAD 36+ mos IM  Return in about 16 days (around 01/03/2018) for medication followup.    [name], MD Primary Care at [facility] [address] [place], [place] [address] Ph.  [phone] Fax [phone]

## 2017-12-18 NOTE — Patient Instructions (Addendum)
Clonzepam: decrease 2mg  x 2 weeks, then 1.5mg  x 2 weeks, 1mg  x 2 weeks, 0.5mg  x 2 weeks than stop  Decreased Concentra to 18mg       If you have lab work done today you will be contacted with your lab results within the next 2 weeks.  If you have not heard from [place] then please contact us. The fastest way to get your results is to register for My Chart.   IF you received an x-ray today, you will receive an invoice from [facility]. Please contact [facility] at [phone] with questions or concerns regarding your invoice.   IF you received labwork today, you will receive an invoice from [facility]. Please contact [facility] at [phone] with questions or concerns regarding your invoice.   Our billing staff will not be able to assist you with questions regarding bills from these companies.  You will be contacted with the lab results as soon as they are available. The fastest way to get your results is to activate your My Chart account. Instructions are located on the last page of this paperwork. If you have not heard from [place] regarding the results in 2 weeks, please contact this office.

## 2017-12-22 LAB — TOXASSURE SELECT 13 (MW), URINE

## 2017-12-25 ENCOUNTER — Ambulatory Visit: Payer: BLUE CROSS/BLUE SHIELD | Admitting: Family Medicine

## 2018-01-03 ENCOUNTER — Encounter: Payer: Self-pay | Admitting: Family Medicine

## 2018-01-03 ENCOUNTER — Ambulatory Visit (INDEPENDENT_AMBULATORY_CARE_PROVIDER_SITE_OTHER): Payer: BLUE CROSS/BLUE SHIELD | Admitting: Family Medicine

## 2018-01-03 VITALS — BP 130/82 | HR 90 | Temp 98.6°F | Resp 17 | Ht 71.5 in | Wt 249.0 lb

## 2018-01-03 DIAGNOSIS — Z79899 Other long term (current) drug therapy: Secondary | ICD-10-CM | POA: Diagnosis not present

## 2018-01-03 DIAGNOSIS — R030 Elevated blood-pressure reading, without diagnosis of hypertension: Secondary | ICD-10-CM | POA: Diagnosis not present

## 2018-01-03 DIAGNOSIS — F902 Attention-deficit hyperactivity disorder, combined type: Secondary | ICD-10-CM

## 2018-01-03 DIAGNOSIS — F411 Generalized anxiety disorder: Secondary | ICD-10-CM | POA: Diagnosis not present

## 2018-01-03 MED ORDER — CLONAZEPAM 1 MG PO TABS
1.0000 mg | ORAL_TABLET | Freq: Two times a day (BID) | ORAL | 0 refills | Status: DC
Start: 2018-01-03 — End: 2018-02-26

## 2018-01-03 MED ORDER — METHYLPHENIDATE HCL ER 18 MG PO TB24: ORAL_TABLET | ORAL | 0 refills | Status: DC

## 2018-01-03 NOTE — Progress Notes (Signed)
10/23/20193:21 PM  Jose Shaffer January 02, 1971, 47 y.o. male [id number]  Chief Complaint  Patient presents with  . medication check    HPI:   Patient is a 47 y.o. male with past medical history significant for ADD  and long tern use of clonazepam who presents today for followup on bzd wean    Having increased anxiety, tremors and raise in BP Last 3 days has been taking 1.5mg  a day, has split the dose into 0.5mg  am and 1mg  later in the day  Instructions was decrease 2mg  x 2 weeks, then 1.5mg  x 2 weeks, 1mg  x 2 weeks, 0.5mg  x 2 weeks than stop  Has cleaned up diet  Does not like lower dose concerta Has not been functioning well, feels like when he was before ADD meds For about the last week he has been doing 36mg  AM and 18mg  around noon He did great functioning wise and having significant less crash  Fall Risk  01/03/2018 12/18/2017 09/27/2017 06/19/2017 03/23/2017  Falls in the past year? No No No No No  Number falls in past yr: - - - - -     Depression screen [facility] 2/9 01/03/2018 12/18/2017 09/27/2017  Decreased Interest 0 0 0  Down, Depressed, Hopeless 0 0 0  PHQ - 2 Score 0 0 0    Allergies  Allergen Reactions  . Ibuprofen Other (See Comments)    Per patient feels like KNOT in throat and itching with rash  . Tramadol Anxiety    Prior to Admission medications   Medication Sig Start Date End Date Taking? Authorizing Provider  clonazePAM (KLONOPIN) 1 MG tablet Take 1 tablet (1 mg total) by mouth 3 (three) times daily as needed for anxiety. 09/27/17  Yes Sherren Mocha, MD  fluticasone (FLONASE) 50 MCG/ACT nasal spray Place 1 spray into both nostrils daily.   Yes [provider]  methylphenidate 18 MG PO CR tablet Take 1 tablet (18 mg total) by mouth daily. Repeat x 1 before lunch if needed 09/27/17  Yes Sherren Mocha, MD  methylphenidate 18 MG PO CR tablet Take 1 tablet (18 mg total) by mouth daily. Repeat x 1 before lunch if needed 10/25/17  Yes Sherren Mocha, MD    Multiple Vitamin (MULTIVITAMIN) tablet Take 1 tablet by mouth daily.   Yes [provider]  naproxen sodium (ALEVE) 220 MG tablet Take 220 mg by mouth daily as needed.   Yes [provider]    Past Medical History:  Diagnosis Date  . ADD (attention deficit disorder)   . Allergy   . Anxiety state, unspecified 03/10/2013  . Diverticulitis   . Insomnia   . Sleep apnea     Past Surgical History:  Procedure Laterality Date  . INGUINAL HERNIA REPAIR Bilateral 01/22/2015   Procedure: LAPAROSCOPIC BILATERAL INGUINAL HERNIA REPAIR WITH MESH AND UMBILICAL HERNIA REPAIR;  Surgeon: Abigail Miyamoto, MD;  Location: [facility];  Service: General;  Laterality: Bilateral;  . QUADRICEPS TENDON REPAIR  08/2008  . UMBILICAL HERNIA REPAIR N/A 01/22/2015   Procedure: UMBILICAL HERNIA REPAIR;  Surgeon: Abigail Miyamoto, MD;  Location: [facility];  Service: General;  Laterality: N/A;    Social History   Tobacco Use  . Smoking status: Never Smoker  . Smokeless tobacco: Never Used  Substance Use Topics  . Alcohol use: Yes    Comment: rare     Family History  Problem Relation Age of Onset  . Hypertension Father   .  Colon polyps Father   . Cancer Maternal Grandmother 16       breast cancer  . Hypertension Paternal Grandmother   . COPD Paternal Grandfather        lung cancer; tobacco use, Doctor, general practice  . Cancer Maternal Aunt        breast cancer, late 56's  . Breast cancer Maternal Aunt   . Cancer Maternal Aunt        breast cancer; diagnosed late 69's  . Breast cancer Maternal Aunt   . Colon cancer Neg Hx   . Rectal cancer Neg Hx   . Stomach cancer Neg Hx   . Esophageal cancer Neg Hx     ROS Per hpi  OBJECTIVE:  Blood pressure (!) 149/88, pulse 90, temperature 98.6 F (37 C), temperature source Oral, resp. rate 17, height 5' 11.5" (1.816 m), weight 249 lb (112.9 kg), SpO2 98 %. Body mass index is 34.24 kg/m.   BP Readings  from Last 3 Encounters:  01/03/18 130/82  12/18/17 (!) 147/87  09/27/17 128/84    Physical Exam  Constitutional: He is oriented to person, place, and time. He appears well-developed and well-nourished.  HENT:  Head: Normocephalic and atraumatic.  Mouth/Throat: Oropharynx is clear and moist.  Eyes: Pupils are equal, round, and reactive to light. Conjunctivae and EOM are normal.  Neck: Neck supple.  Cardiovascular: Normal rate and regular rhythm. Exam reveals no gallop and no friction rub.  No murmur heard. Pulmonary/Chest: Effort normal and breath sounds normal. He has no wheezes. He has no rales.  Musculoskeletal: He exhibits no edema.  Neurological: He is alert and oriented to person, place, and time.  Skin: Skin is warm and dry.  Psychiatric: He has a normal mood and affect.  Nursing note and vitals reviewed.    ASSESSMENT and PLAN  1. Anxiety state Not tolerating pace of wean, will slow down, back to 2mg  day x 4 weeks. Decrease wean next step by 0.25mg .  - clonazePAM (KLONOPIN) 1 MG tablet; Take 1 tablet (1 mg total) by mouth 2 (two) times daily.  2. Attention deficit hyperactivity disorder (ADHD), combined type Uncontrolled at lower concerta dose. Separating dose helps with crash. Will continue with current regime and reassess.  3. Encounter for long term benzodiazepine therapy  4. Elevated BP without diagnosis of hypertension Repeat BP < 140/90, continue with LFM - Care order/instruction:  Other orders - methylphenidate 18 MG PO CR tablet; Take 2 tablets (36 mg total) by mouth every morning AND 1 tablet (18 mg total) daily after lunch. Repeat x 1 before lunch if needed.  Return in about 4 weeks (around 01/31/2018).    [name], MD Primary Care at [facility] [address] [place], [place] [address] Ph.  [phone] Fax [phone]

## 2018-01-03 NOTE — Patient Instructions (Signed)
° ° ° °  If you have lab work done today you will be contacted with your lab results within the next 2 weeks.  If you have not heard from us then please contact us. The fastest way to get your results is to register for My Chart. ° ° °IF you received an x-ray today, you will receive an invoice from [facility]. Please contact [facility] at [phone] with questions or concerns regarding your invoice.  ° °IF you received labwork today, you will receive an invoice from [facility]. Please contact [facility] at [phone] with questions or concerns regarding your invoice.  ° °Our billing staff will not be able to assist you with questions regarding bills from these companies. ° °You will be contacted with the lab results as soon as they are available. The fastest way to get your results is to activate your My Chart account. Instructions are located on the last page of this paperwork. If you have not heard from us regarding the results in 2 weeks, please contact this office. °  ° ° ° °

## 2018-01-31 ENCOUNTER — Ambulatory Visit: Payer: BLUE CROSS/BLUE SHIELD | Admitting: Family Medicine

## 2018-02-26 ENCOUNTER — Encounter: Payer: Self-pay | Admitting: Family Medicine

## 2018-02-26 ENCOUNTER — Ambulatory Visit (INDEPENDENT_AMBULATORY_CARE_PROVIDER_SITE_OTHER): Payer: BLUE CROSS/BLUE SHIELD | Admitting: Family Medicine

## 2018-02-26 ENCOUNTER — Other Ambulatory Visit: Payer: Self-pay

## 2018-02-26 VITALS — BP 122/82 | HR 79 | Temp 97.3°F | Ht 71.5 in | Wt 248.2 lb

## 2018-02-26 DIAGNOSIS — F902 Attention-deficit hyperactivity disorder, combined type: Secondary | ICD-10-CM

## 2018-02-26 DIAGNOSIS — F411 Generalized anxiety disorder: Secondary | ICD-10-CM

## 2018-02-26 DIAGNOSIS — Z79899 Other long term (current) drug therapy: Secondary | ICD-10-CM

## 2018-02-26 MED ORDER — CLONAZEPAM 1 MG PO TABS: 1.5000 mg | ORAL_TABLET | Freq: Every day | ORAL | 0 refills | Status: DC

## 2018-02-26 MED ORDER — AMPHETAMINE-DEXTROAMPHET ER 20 MG PO CP24: 20.0000 mg | ORAL_CAPSULE | Freq: Every day | ORAL | 0 refills | Status: DC

## 2018-02-26 NOTE — Patient Instructions (Signed)
° ° ° °  If you have lab work done today you will be contacted with your lab results within the next 2 weeks.  If you have not heard from us then please contact us. The fastest way to get your results is to register for My Chart. ° ° °IF you received an x-ray today, you will receive an invoice from [facility]. Please contact [facility] at [phone] with questions or concerns regarding your invoice.  ° °IF you received labwork today, you will receive an invoice from [facility]. Please contact [facility] at [phone] with questions or concerns regarding your invoice.  ° °Our billing staff will not be able to assist you with questions regarding bills from these companies. ° °You will be contacted with the lab results as soon as they are available. The fastest way to get your results is to activate your My Chart account. Instructions are located on the last page of this paperwork. If you have not heard from us regarding the results in 2 weeks, please contact this office. °  ° ° ° °

## 2018-02-26 NOTE — Progress Notes (Signed)
12/16/20192:54 PM  Jose Shaffer 08/07/1970, 47 y.o. male [id number]  Chief Complaint  Patient presents with  . ADHD    depends on the day, may not take as rx'd. Tries to take bid. Seems not to be working as well due to being on it so long  . Anxiety    now taking the 1.5. Says it is a bust time of yr. May be difficult to start the weaning process right now    HPI:   Patient is a 47 y.o. male with past medical history significant for ADD and anxiety who presents today for routine followup  Clonazepam using about 1.5 tabs about 3-4 pm Concerta not working well, not really focusing, just making him a bit more anxious and irritable, he did much better on adderall XR 20mg   Originally requested change due to having issues getting it refilled Sleeping ok. Denies decreased appetite. Denies any palpitations or chest pain  Checking BP in the evening after he comes back from the gym, 130/80s  GAD 7= 15  Fall Risk  02/26/2018 01/03/2018 12/18/2017 09/27/2017 06/19/2017  Falls in the past year? 0 No No No No  Number falls in past yr: - - - - -     Depression screen [facility] 2/9 02/26/2018 01/03/2018 12/18/2017  Decreased Interest 0 0 0  Down, Depressed, Hopeless 0 0 0  PHQ - 2 Score 0 0 0    Allergies  Allergen Reactions  . Ibuprofen Other (See Comments)    Per patient feels like KNOT in throat and itching with rash  . Tramadol Anxiety    Prior to Admission medications   Medication Sig Start Date End Date Taking? Authorizing Provider  clonazePAM (KLONOPIN) 1 MG tablet Take 1 tablet (1 mg total) by mouth 2 (two) times daily. 01/03/18  Yes [name], MD  fluticasone (FLONASE) 50 MCG/ACT nasal spray Place 1 spray into both nostrils daily.   Yes [provider]  methylphenidate 18 MG PO CR tablet Take 2 tablets (36 mg total) by mouth every morning AND 1 tablet (18 mg total) daily after lunch. Repeat x 1 before lunch if needed. 01/03/18  Yes [name], MD    Multiple Vitamin (MULTIVITAMIN) tablet Take 1 tablet by mouth daily.   Yes [provider]  naproxen sodium (ALEVE) 220 MG tablet Take 220 mg by mouth daily as needed.   Yes [provider]    Past Medical History:  Diagnosis Date  . ADD (attention deficit disorder)   . Allergy   . Anxiety state, unspecified 03/10/2013  . Diverticulitis   . Insomnia   . Sleep apnea     Past Surgical History:  Procedure Laterality Date  . INGUINAL HERNIA REPAIR Bilateral 01/22/2015   Procedure: LAPAROSCOPIC BILATERAL INGUINAL HERNIA REPAIR WITH MESH AND UMBILICAL HERNIA REPAIR;  Surgeon: Abigail Miyamoto, MD;  Location: [facility];  Service: General;  Laterality: Bilateral;  . QUADRICEPS TENDON REPAIR  08/2008  . UMBILICAL HERNIA REPAIR N/A 01/22/2015   Procedure: UMBILICAL HERNIA REPAIR;  Surgeon: Abigail Miyamoto, MD;  Location: [facility];  Service: General;  Laterality: N/A;    Social History   Tobacco Use  . Smoking status: Never Smoker  . Smokeless tobacco: Never Used  Substance Use Topics  . Alcohol use: Yes    Comment: rare     Family History  Problem Relation Age of Onset  . Hypertension Father   . Colon polyps Father   .  Cancer Maternal Grandmother 35       breast cancer  . Hypertension Paternal Grandmother   . COPD Paternal Grandfather        lung cancer; tobacco use, Doctor, general practice  . Cancer Maternal Aunt        breast cancer, late 76's  . Breast cancer Maternal Aunt   . Cancer Maternal Aunt        breast cancer; diagnosed late 29's  . Breast cancer Maternal Aunt   . Colon cancer Neg Hx   . Rectal cancer Neg Hx   . Stomach cancer Neg Hx   . Esophageal cancer Neg Hx     ROS Per hpi  OBJECTIVE:  Blood pressure (!) 144/83, pulse 79, temperature (!) 97.3 F (36.3 C), temperature source Oral, height 5' 11.5" (1.816 m), weight 248 lb 3.2 oz (112.6 kg), SpO2 97 %. Body mass index is 34.13 kg/m.   BP Readings from  Last 3 Encounters:  02/26/18 (!) 144/83  01/03/18 130/82  12/18/17 (!) 147/87    Physical Exam Vitals signs and nursing note reviewed.  Constitutional:      Appearance: He is well-developed.  HENT:     Head: Normocephalic and atraumatic.  Eyes:     Conjunctiva/sclera: Conjunctivae normal.     Pupils: Pupils are equal, round, and reactive to light.  Neck:     Musculoskeletal: Neck supple.  Pulmonary:     Effort: Pulmonary effort is normal.  Skin:    General: Skin is warm and dry.  Neurological:     Mental Status: He is alert and oriented to person, place, and time.     ASSESSMENT and PLAN  1. Attention deficit hyperactivity disorder (ADHD), combined type Not well controlled. Having side effects without any benefit. Will change back to adderall. Continue to monitor BP, suspect 2/2 medication. Consider referral to specialist  2. Anxiety state Continue to wean. Klonopin mostly used to address side effects from stimulant. Next wean, change to 0.5mg  tabs - 1.5 tab = 1.25mg   - clonazePAM (KLONOPIN) 1 MG tablet; Take 1.5 tablets (1.5 mg total) by mouth daily.  3. Medication management - ToxASSURE Select 13 (MW), Urine - Care order/instruction:  Other orders - amphetamine-dextroamphetamine (ADDERALL XR) 20 MG 24 hr capsule; Take 1 capsule (20 mg total) by mouth daily.  Return in about 4 weeks (around 03/26/2018).    [name], MD Primary Care at [facility] [address] [place], [place] [address] Ph.  [phone] Fax [phone]

## 2018-02-28 LAB — TOXASSURE SELECT 13 (MW), URINE

## 2018-03-27 ENCOUNTER — Other Ambulatory Visit: Payer: Self-pay

## 2018-03-27 ENCOUNTER — Ambulatory Visit (INDEPENDENT_AMBULATORY_CARE_PROVIDER_SITE_OTHER): Payer: BLUE CROSS/BLUE SHIELD | Admitting: Family Medicine

## 2018-03-27 ENCOUNTER — Encounter: Payer: Self-pay | Admitting: Family Medicine

## 2018-03-27 VITALS — BP 133/88 | HR 81 | Temp 98.6°F | Ht 71.5 in | Wt 248.4 lb

## 2018-03-27 DIAGNOSIS — Z79899 Other long term (current) drug therapy: Secondary | ICD-10-CM

## 2018-03-27 DIAGNOSIS — F902 Attention-deficit hyperactivity disorder, combined type: Secondary | ICD-10-CM

## 2018-03-27 DIAGNOSIS — R748 Abnormal levels of other serum enzymes: Secondary | ICD-10-CM

## 2018-03-27 MED ORDER — CLONAZEPAM 0.5 MG PO TABS: 1.2500 mg | ORAL_TABLET | Freq: Every day | ORAL | 0 refills | Status: DC

## 2018-03-27 MED ORDER — AMPHETAMINE-DEXTROAMPHET ER 20 MG PO CP24: 20.0000 mg | ORAL_CAPSULE | Freq: Every day | ORAL | 0 refills | Status: DC

## 2018-03-27 NOTE — Progress Notes (Signed)
1/14/20205:42 PM  Jose Shaffer 26-Jul-1970, 48 y.o. male [id number]  Chief Complaint  Patient presents with  . ADHD    MEDICATION REFILL    HPI:   Patient is a 48 y.o. male with past medical history significant for ADD and anxiety  who presents today for routine followup  Changed to adderral ER last visit, to 20mg   Has been at times spliting at taking only 10mg  Slowly undergoing clonazepam wean Decreased to TTD 1.5mg , he currently taking 1mg  and feels about every 3rd day starts having withdrawal sx  Was taking supplements for body building Including testosterone and other anabolic steroids Has been weaning off supplements Will be working with a urologist for testosterone management   Fall Risk  02/26/2018 01/03/2018 12/18/2017 09/27/2017 06/19/2017  Falls in the past year? 0 No No No No  Number falls in past yr: - - - - -     [facility] 03/27/2018 02/26/2018 01/03/2018  Decreased Interest 0 0 0  Down, Depressed, Hopeless 0 0 0  PHQ - 2 Score 0 0 0    Allergies  Allergen Reactions  . Ibuprofen Other (See Comments)    Per patient feels like KNOT in throat and itching with rash  . Tramadol Anxiety    Prior to Admission medications   Medication Sig Start Date End Date Taking? Authorizing Provider  amphetamine-dextroamphetamine (ADDERALL XR) 20 MG 24 hr capsule Take 1 capsule (20 mg total) by mouth daily. 02/26/18  Yes Myles LippsSantiago, Irma M, MD  clonazePAM (KLONOPIN) 1 MG tablet Take 1.5 tablets (1.5 mg total) by mouth daily. 02/26/18  Yes Myles LippsSantiago, Irma M, MD  fluticasone (FLONASE) 50 MCG/ACT nasal spray Place 1 spray into both nostrils daily.   Yes [provider]  Multiple Vitamin (MULTIVITAMIN) tablet Take 1 tablet by mouth daily.   Yes [provider]  naproxen sodium (ALEVE) 220 MG tablet Take 220 mg by mouth daily as needed.   Yes [provider]    Past Medical History:  Diagnosis Date  . ADD (attention deficit  disorder)   . Allergy   . Anxiety state, unspecified 03/10/2013  . Diverticulitis   . Insomnia   . Long-term current use of testosterone replacement therapy   . Sleep apnea     Past Surgical History:  Procedure Laterality Date  . INGUINAL HERNIA REPAIR Bilateral 01/22/2015   Procedure: LAPAROSCOPIC BILATERAL INGUINAL HERNIA REPAIR WITH MESH AND UMBILICAL HERNIA REPAIR;  Surgeon: Abigail Miyamotoouglas Blackman, MD;  Location: [facility];  Service: General;  Laterality: Bilateral;  . QUADRICEPS TENDON REPAIR  08/2008  . UMBILICAL HERNIA REPAIR N/A 01/22/2015   Procedure: UMBILICAL HERNIA REPAIR;  Surgeon: Abigail Miyamotoouglas Blackman, MD;  [facility];  Service: General;  Laterality: N/A;    Social History   Tobacco Use  . Smoking status: Never Smoker  . Smokeless tobacco: Never Used  Substance Use Topics  . Alcohol use: Yes    Comment: rare     Family History  Problem Relation Age of Onset  . Hypertension Father   . Colon polyps Father   . Cancer Maternal Grandmother [phone]       breast cancer  . Hypertension Paternal Grandmother   . COPD Paternal Grandfather        lung cancer; tobacco use, Doctor, general practicesteel mill worker  . Cancer Maternal Aunt        breast cancer, late 6050's  . Breast cancer Maternal Aunt   . Cancer Maternal Aunt  breast cancer; diagnosed late 2650's  . Breast cancer Maternal Aunt   . Colon cancer Neg Hx   . Rectal cancer Neg Hx   . Stomach cancer Neg Hx   . Esophageal cancer Neg Hx     ROS Per hpi  OBJECTIVE:  Blood pressure 133/88, pulse 81, temperature 98.6 F (37 C), temperature source Oral, height 5' 11.5" (1.816 m), weight 248 lb 6.4 oz (112.7 kg), SpO2 96 %. Body mass index is 34.16 kg/m.   Wt Readings from Last 3 Encounters:  03/27/18 248 lb 6.4 oz (112.7 kg)  02/26/18 248 lb 3.2 oz (112.6 kg)  01/03/18 249 lb (112.9 kg)    Physical Exam Vitals signs and nursing note reviewed.  Constitutional:      Appearance: He is  well-developed.  HENT:     Head: Normocephalic and atraumatic.  Eyes:     Conjunctiva/sclera: Conjunctivae normal.     Pupils: Pupils are equal, round, and reactive to light.  Neck:     Musculoskeletal: Neck supple.  Pulmonary:     Effort: Pulmonary effort is normal.  Skin:    General: Skin is warm and dry.  Neurological:     Mental Status: He is alert and oriented to person, place, and time.      ASSESSMENT and PLAN   1. Attention deficit hyperactivity disorder (ADHD), combined type Stable. Doing well on adderral. pmp reviewed. meds refilled.  2. Medication management 3. Encounter for long term benzodiazepine therapy Discussed once again necessity of slow wean. Decreasing to TDD 1.25mg , next wean down to TDD 1mg   4. Abnormal liver enzymes New. Might be related to anabolic steroids. Has stopped some and continues to wean off others. Rechecking LFTs today, if persistent, will need further workup - Comprehensive metabolic panel  Other orders - clonazePAM (KLONOPIN) 0.5 MG tablet; Take 2.5 tablets (1.25 mg total) by mouth daily. - amphetamine-dextroamphetamine (ADDERALL XR) 20 MG 24 hr capsule; Take 1 capsule (20 mg total) by mouth daily.    Return in about 4 weeks (around 04/24/2018).    Myles LippsIrma M Santiago, MD Primary Care at [facility] [address] [place], [place] [address] Ph.  [phone] Fax [phone]

## 2018-03-27 NOTE — Patient Instructions (Signed)
° ° ° °  If you have lab work done today you will be contacted with your lab results within the next 2 weeks.  If you have not heard from us then please contact us. The fastest way to get your results is to register for My Chart. ° ° °IF you received an x-ray today, you will receive an invoice from [facility]. Please contact [facility] at [phone] with questions or concerns regarding your invoice.  ° °IF you received labwork today, you will receive an invoice from [facility]. Please contact [facility] at [phone] with questions or concerns regarding your invoice.  ° °Our billing staff will not be able to assist you with questions regarding bills from these companies. ° °You will be contacted with the lab results as soon as they are available. The fastest way to get your results is to activate your My Chart account. Instructions are located on the last page of this paperwork. If you have not heard from us regarding the results in 2 weeks, please contact this office. °  ° ° ° °

## 2018-03-28 LAB — COMPREHENSIVE METABOLIC PANEL
ALT: 74 IU/L — ABNORMAL HIGH (ref 0–44)
AST: 43 IU/L — ABNORMAL HIGH (ref 0–40)
Albumin/Globulin Ratio: 1.9 (ref 1.2–2.2)
Albumin: 4.7 g/dL (ref 3.5–5.5)
Alkaline Phosphatase: 38 IU/L — ABNORMAL LOW (ref 39–117)
BUN/Creatinine Ratio: 16 (ref 9–20)
BUN: 20 mg/dL (ref 6–24)
Bilirubin Total: 0.6 mg/dL (ref 0.0–1.2)
CO2: 22 mmol/L (ref 20–29)
Calcium: 9.9 mg/dL (ref 8.7–10.2)
Chloride: 103 mmol/L (ref 96–106)
Creatinine, Ser: 1.28 mg/dL — ABNORMAL HIGH (ref 0.76–1.27)
GFR calc Af Amer: 76 mL/min/{1.73_m2} (ref >59)
GFR calc non Af Amer: 66 mL/min/{1.73_m2} (ref >59)
Globulin, Total: 2.5 g/dL (ref 1.5–4.5)
Glucose: 90 mg/dL (ref 65–99)
Potassium: 4.6 mmol/L (ref 3.5–5.2)
Sodium: 141 mmol/L (ref 134–144)
Total Protein: 7.2 g/dL (ref 6.0–8.5)

## 2018-04-10 ENCOUNTER — Telehealth: Payer: Self-pay | Admitting: Family Medicine

## 2018-04-10 NOTE — Telephone Encounter (Signed)
LVM for pt to call the office and reschedule his appt that is scheduled for 04/25/18 with Dr. Leretha Pol. Due to provider being in a meeting, pt will eed to reschedule. When pt calls back, please rescheduled at his convenience. Thank you!

## 2018-04-25 ENCOUNTER — Other Ambulatory Visit: Payer: Self-pay

## 2018-04-25 ENCOUNTER — Encounter: Payer: Self-pay | Admitting: Family Medicine

## 2018-04-25 ENCOUNTER — Ambulatory Visit: Payer: BLUE CROSS/BLUE SHIELD | Admitting: Family Medicine

## 2018-04-25 ENCOUNTER — Ambulatory Visit (INDEPENDENT_AMBULATORY_CARE_PROVIDER_SITE_OTHER): Payer: BLUE CROSS/BLUE SHIELD | Admitting: Family Medicine

## 2018-04-25 VITALS — BP 138/87 | HR 89 | Temp 98.1°F | Ht 71.5 in | Wt 248.0 lb

## 2018-04-25 DIAGNOSIS — F902 Attention-deficit hyperactivity disorder, combined type: Secondary | ICD-10-CM

## 2018-04-25 DIAGNOSIS — F411 Generalized anxiety disorder: Secondary | ICD-10-CM | POA: Diagnosis not present

## 2018-04-25 DIAGNOSIS — R748 Abnormal levels of other serum enzymes: Secondary | ICD-10-CM | POA: Diagnosis not present

## 2018-04-25 DIAGNOSIS — Z79899 Other long term (current) drug therapy: Secondary | ICD-10-CM

## 2018-04-25 DIAGNOSIS — R3915 Urgency of urination: Secondary | ICD-10-CM

## 2018-04-25 DIAGNOSIS — R351 Nocturia: Secondary | ICD-10-CM

## 2018-04-25 DIAGNOSIS — J302 Other seasonal allergic rhinitis: Secondary | ICD-10-CM

## 2018-04-25 LAB — POCT URINALYSIS DIP (MANUAL ENTRY)
Bilirubin, UA: NEGATIVE
Glucose, UA: NEGATIVE mg/dL
Ketones, POC UA: NEGATIVE mg/dL
Leukocytes, UA: NEGATIVE
Nitrite, UA: NEGATIVE
Protein Ur, POC: 30 mg/dL — AB
Spec Grav, UA: 1.02 (ref 1.010–1.025)
Urobilinogen, UA: 0.2 E.U./dL
pH, UA: 5.5 (ref 5.0–8.0)

## 2018-04-25 LAB — POC MICROSCOPIC URINALYSIS (UMFC): Mucus: ABSENT

## 2018-04-25 MED ORDER — DOXYCYCLINE HYCLATE 100 MG PO TABS: 100.0000 mg | ORAL_TABLET | Freq: Two times a day (BID) | ORAL | 0 refills | Status: DC

## 2018-04-25 MED ORDER — MONTELUKAST SODIUM 10 MG PO TABS: 10.0000 mg | ORAL_TABLET | Freq: Every day | ORAL | 3 refills | Status: DC

## 2018-04-25 MED ORDER — TAMSULOSIN HCL 0.4 MG PO CAPS: 0.4000 mg | ORAL_CAPSULE | Freq: Every day | ORAL | 3 refills | Status: DC

## 2018-04-25 MED ORDER — CLONAZEPAM 0.5 MG PO TABS: 1.2500 mg | ORAL_TABLET | Freq: Every day | ORAL | 0 refills | Status: DC

## 2018-04-25 MED ORDER — AMPHETAMINE-DEXTROAMPHET ER 20 MG PO CP24: 20.0000 mg | ORAL_CAPSULE | Freq: Every day | ORAL | 0 refills | Status: DC

## 2018-04-25 MED ORDER — ESCITALOPRAM OXALATE 10 MG PO TABS: 10.0000 mg | ORAL_TABLET | Freq: Every day | ORAL | 2 refills | Status: DC

## 2018-04-25 NOTE — Progress Notes (Signed)
2/12/20204:17 PM  Jose Shaffer [birth year], 48 y.o. male [id number]  Chief Complaint  Patient presents with  . Medication Refill    Tapering down of the medication for adhd  . Urine Output    having trouble urinating thinks it may have something to do with past suppliments taking while doing body building. Would like PSA checked    HPI:   Patient is a 47 y.o. 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  Overall doing ok Still taking 10mg  adderall BID on most days Having "stupor" around noon, maybe related to sleep Not sleeping well as he has started having issues with nocturia x 3-4, no dysuria Had psa checked by urology, patient reports 1.3 Having some weak steam, urgency, dribbling, frequency Not having any ED He has used afrin for long term, he reports h/o ENT Reports seasonal allergies and dogs Trying to wean from afrin, doing flonase  Doing 2 tabs of clonzepam on most days, takes 1/2 tab on very bad days, anxiety wise Still coming down from anabolics No fhx prostate cancer  Has been on paxil - helped with anxiety but made him too bold wellbutrin made him suicidal   Wt Readings from Last 3 Encounters:  04/25/18 248 lb (112.5 kg)  03/27/18 248 lb 6.4 oz (112.7 kg)  02/26/18 248 lb 3.2 oz (112.6 kg)   BP Readings from Last 3 Encounters:  04/25/18 138/87  03/27/18 133/88  02/26/18 122/82    Lab Results  Component Value Date   ALT 74 (H) 03/27/2018   AST 43 (H) 03/27/2018   ALKPHOS 38 (L) 03/27/2018   BILITOT 0.6 03/27/2018    Fall Risk  04/25/2018 02/26/2018 01/03/2018 12/18/2017 09/27/2017  Falls in the past year? 0 0 No No No  Number falls in past yr: 0 - - - -  Injury with Fall? 0 - - - -     Depression screen [facility] 2/9 04/25/2018 03/27/2018 02/26/2018  Decreased Interest 0 0 0  Down, Depressed, Hopeless 0 0 0  PHQ - 2 Score 0 0 0    Allergies  Allergen Reactions  .  Ibuprofen Other (See Comments)    Per patient feels like KNOT in throat and itching with rash  . Tramadol Anxiety    Prior to Admission medications   Medication Sig Start Date End Date Taking? Authorizing Provider  amphetamine-dextroamphetamine (ADDERALL XR) 20 MG 24 hr capsule Take 1 capsule (20 mg total) by mouth daily. 03/27/18  Yes [name], MD  clonazePAM (KLONOPIN) 0.5 MG tablet Take 2.5 tablets (1.25 mg total) by mouth daily. 03/27/18  Yes [name], MD  Multiple Vitamin (MULTIVITAMIN) tablet Take 1 tablet by mouth daily.   Yes [provider]  naproxen sodium (ALEVE) 220 MG tablet Take 220 mg by mouth daily as needed.   Yes [provider]    Past Medical History:  Diagnosis Date  . ADD (attention deficit disorder)   . Allergy   . Anxiety state, unspecified 03/10/2013  . Diverticulitis   . Insomnia   . Long-term current use of testosterone replacement therapy   . Sleep apnea     Past Surgical History:  Procedure Laterality Date  . INGUINAL HERNIA REPAIR Bilateral 01/22/2015   Procedure: LAPAROSCOPIC BILATERAL INGUINAL HERNIA REPAIR WITH MESH AND UMBILICAL HERNIA REPAIR;  Surgeon: Abigail Miyamoto, MD;  Location: [facility];  Service: General;  Laterality: Bilateral;  .  QUADRICEPS TENDON REPAIR  08/2008  . UMBILICAL HERNIA REPAIR N/A 01/22/2015   Procedure: UMBILICAL HERNIA REPAIR;  Surgeon: Abigail Miyamotoouglas Blackman, MD;  Location: [facility];  Service: General;  Laterality: N/A;    Social History   Tobacco Use  . Smoking status: Never Smoker  . Smokeless tobacco: Never Used  Substance Use Topics  . Alcohol use: Yes    Comment: rare     Family History  Problem Relation Age of Onset  . Hypertension Father   . Colon polyps Father   . Cancer Maternal Grandmother [phone]       breast cancer  . Hypertension Paternal Grandmother   . COPD Paternal Grandfather        lung cancer; tobacco use, Doctor, general practicesteel mill worker  .  Cancer Maternal Aunt        breast cancer, late 6550's  . Breast cancer Maternal Aunt   . Cancer Maternal Aunt        breast cancer; diagnosed late 2950's  . Breast cancer Maternal Aunt   . Colon cancer Neg Hx   . Rectal cancer Neg Hx   . Stomach cancer Neg Hx   . Esophageal cancer Neg Hx     ROS Per hpi  OBJECTIVE:  Blood pressure 138/87, pulse 89, temperature 98.1 F (36.7 C), temperature source Oral, height 5' 11.5" (1.816 m), weight 248 lb (112.5 kg), SpO2 99 %. Body mass index is 34.11 kg/m.   Physical Exam Vitals signs and nursing note reviewed.  Constitutional:      Appearance: He is well-developed.  HENT:     Head: Normocephalic and atraumatic.  Eyes:     Conjunctiva/sclera: Conjunctivae normal.     Pupils: Pupils are equal, round, and reactive to light.  Neck:     Musculoskeletal: Neck supple.  Pulmonary:     Effort: Pulmonary effort is normal.  Skin:    General: Skin is warm and dry.  Neurological:     Mental Status: He is alert and oriented to person, place, and time.    Results for orders placed or performed in visit on 04/25/18 (from the past 24 hour(s))  POCT urinalysis dipstick     Status: Abnormal   Collection Time: 04/25/18  5:07 PM  Result Value Ref Range   Color, UA yellow yellow   Clarity, UA clear clear   Glucose, UA negative negative mg/dL   Bilirubin, UA negative negative   Ketones, POC UA negative negative mg/dL   Spec Grav, UA 1.6101.020 9.6041.010 - 1.025   Blood, UA trace-intact (A) negative   pH, UA 5.5 5.0 - 8.0   Protein Ur, POC =30 (A) negative mg/dL   Urobilinogen, UA 0.2 0.2 or 1.0 E.U./dL   Nitrite, UA Negative Negative   Leukocytes, UA Negative Negative  POCT Microscopic Urinalysis ([facility])     Status: Abnormal   Collection Time: 04/25/18  5:23 PM  Result Value Ref Range   WBC,UR,HPF,POC None None WBC/hpf   RBC,UR,HPF,POC Few (A) None RBC/hpf   Bacteria Few (A) None, Too numerous to count   Mucus Absent Absent   Epithelial Cells, UR  Per Microscopy None None, Too numerous to count cells/hpf    ASSESSMENT and PLAN  1. Attention deficit hyperactivity disorder (ADHD), combined type Stable. Cont current dose  2. Nocturia New concerns. UA with RBC and bacteria. rx doxy, hold on flomax to see if symptoms resolve. Patient reports a recent normal PSA  3. Generalized anxiety disorder Not controlled as continues  wean of clonazepam. Starting lexapro. Cont slow wean. Reviewed ssx of concern  4. Abnormal liver enzymes - Comprehensive metabolic panel  5. Encounter for long term benzodiazepine therapy  6. Medication management  7. Urgency of urination - POCT urinalysis dipstick - POCT Microscopic Urinalysis ([facility]) - urine culture  8. Seasonal allergies Uncontrolled. Has component of rhinitis medicamentosa. Continue with flonase. Adding singulair  Other orders - tamsulosin (FLOMAX) 0.4 MG CAPS capsule; Take 1 capsule (0.4 mg total) by mouth daily. - escitalopram (LEXAPRO) 10 MG tablet; Take 1 tablet (10 mg total) by mouth daily. - amphetamine-dextroamphetamine (ADDERALL XR) 20 MG 24 hr capsule; Take 1 capsule (20 mg total) by mouth daily. - clonazePAM (KLONOPIN) 0.5 MG tablet; Take 2.5 tablets (1.25 mg total) by mouth daily. - montelukast (SINGULAIR) 10 MG tablet; Take 1 tablet (10 mg total) by mouth at bedtime.  Return in about 4 weeks (around 05/23/2018).    [name], MD Primary Care at [facility] [address] [place], [place] [address] Ph.  [phone] Fax [phone]

## 2018-04-26 LAB — COMPREHENSIVE METABOLIC PANEL
ALT: 72 IU/L — ABNORMAL HIGH (ref 0–44)
AST: 46 IU/L — ABNORMAL HIGH (ref 0–40)
Albumin/Globulin Ratio: 1.9 (ref 1.2–2.2)
Albumin: 4.7 g/dL (ref 4.0–5.0)
Alkaline Phosphatase: 46 IU/L (ref 39–117)
BUN/Creatinine Ratio: 17 (ref 9–20)
BUN: 21 mg/dL (ref 6–24)
Bilirubin Total: 0.3 mg/dL (ref 0.0–1.2)
CO2: 22 mmol/L (ref 20–29)
Calcium: 10.1 mg/dL (ref 8.7–10.2)
Chloride: 100 mmol/L (ref 96–106)
Creatinine, Ser: 1.24 mg/dL (ref 0.76–1.27)
GFR calc Af Amer: 79 mL/min/{1.73_m2} (ref >59)
GFR calc non Af Amer: 68 mL/min/{1.73_m2} (ref >59)
Globulin, Total: 2.5 g/dL (ref 1.5–4.5)
Glucose: 81 mg/dL (ref 65–99)
Potassium: 4.9 mmol/L (ref 3.5–5.2)
Sodium: 141 mmol/L (ref 134–144)
Total Protein: 7.2 g/dL (ref 6.0–8.5)

## 2018-04-26 LAB — URINE CULTURE

## 2018-05-22 ENCOUNTER — Encounter: Payer: Self-pay | Admitting: Family Medicine

## 2018-05-22 ENCOUNTER — Other Ambulatory Visit: Payer: Self-pay

## 2018-05-22 ENCOUNTER — Ambulatory Visit (INDEPENDENT_AMBULATORY_CARE_PROVIDER_SITE_OTHER): Payer: BLUE CROSS/BLUE SHIELD | Admitting: Family Medicine

## 2018-05-22 VITALS — BP 155/89 | HR 83 | Temp 97.6°F | Resp 18 | Ht 71.5 in | Wt 249.0 lb

## 2018-05-22 DIAGNOSIS — F902 Attention-deficit hyperactivity disorder, combined type: Secondary | ICD-10-CM | POA: Diagnosis not present

## 2018-05-22 DIAGNOSIS — N401 Enlarged prostate with lower urinary tract symptoms: Secondary | ICD-10-CM | POA: Diagnosis not present

## 2018-05-22 DIAGNOSIS — R351 Nocturia: Secondary | ICD-10-CM

## 2018-05-22 DIAGNOSIS — F411 Generalized anxiety disorder: Secondary | ICD-10-CM

## 2018-05-22 DIAGNOSIS — Z23 Encounter for immunization: Secondary | ICD-10-CM

## 2018-05-22 DIAGNOSIS — R748 Abnormal levels of other serum enzymes: Secondary | ICD-10-CM

## 2018-05-22 MED ORDER — AMPHETAMINE-DEXTROAMPHET ER 20 MG PO CP24: 20.0000 mg | ORAL_CAPSULE | ORAL | 0 refills | Status: DC

## 2018-05-22 MED ORDER — CLONAZEPAM 0.5 MG PO TABS: 1.0000 mg | ORAL_TABLET | Freq: Every day | ORAL | 0 refills | Status: DC

## 2018-05-22 MED ORDER — AMPHETAMINE-DEXTROAMPHET ER 20 MG PO CP24: 20.0000 mg | ORAL_CAPSULE | Freq: Every day | ORAL | 0 refills | Status: DC

## 2018-05-22 NOTE — Patient Instructions (Signed)
° ° ° °  If you have lab work done today you will be contacted with your lab results within the next 2 weeks.  If you have not heard from us then please contact us. The fastest way to get your results is to register for My Chart. ° ° °IF you received an x-ray today, you will receive an invoice from [facility]. Please contact  Radiology at [phone] with questions or concerns regarding your invoice.  ° °IF you received labwork today, you will receive an invoice from [facility]. Please contact [facility] at [phone] with questions or concerns regarding your invoice.  ° °Our billing staff will not be able to assist you with questions regarding bills from these companies. ° °You will be contacted with the lab results as soon as they are available. The fastest way to get your results is to activate your My Chart account. Instructions are located on the last page of this paperwork. If you have not heard from us regarding the results in 2 weeks, please contact this office. °  ° ° ° °

## 2018-05-22 NOTE — Progress Notes (Signed)
3/10/20205:49 PM  Jose Shaffer September 22, 1970, 48 y.o. male [id number]  Chief Complaint  Patient presents with  . ADHD    follow up   . Laceration    two fingers cut on left hand     HPI:   Patient is a 48 y.o. 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  Last OV feb 2020 Started on lexapro - taking 5mg ,first had some sleepiness and issues with ejaculation but now tolerating it well, helping with anxiety Started on flomax - working well, tolerating well Still weaning off anabolic steroids Has decreased down to clonazepam 1mg  daily for about 2 months, takes around 5pm, feeling much better as he has decreased dose, thinking clearer in the morning,  Doing well on adderall, able to sleep well, good appettite Had an accident with a table saw, nicked nails of left hand 2nd and 3rd, keeping clean, not bleeding, did not seek medical care Last Td 5 years ago Last pmp reviewed jan 2020 Last UDS dec 2019  Lab Results  Component Value Date   ALT 72 (H) 04/25/2018   AST 46 (H) 04/25/2018   ALKPHOS 46 04/25/2018   BILITOT 0.3 04/25/2018    Fall Risk  05/22/2018 04/25/2018 02/26/2018 01/03/2018 12/18/2017  Falls in the past year? 0 0 0 No No  Number falls in past yr: - 0 - - -  Injury with Fall? - 0 - - -  Follow up Falls evaluation completed - - - -     Depression screen [facility] 2/9 05/22/2018 04/25/2018 03/27/2018  Decreased Interest 0 0 0  Down, Depressed, Hopeless 0 0 0  PHQ - 2 Score 0 0 0    Allergies  Allergen Reactions  . Ibuprofen Other (See Comments)    Per patient feels like KNOT in throat and itching with rash  . Tramadol Anxiety    Prior to Admission medications   Medication Sig Start Date End Date Taking? Authorizing Provider  amphetamine-dextroamphetamine (ADDERALL XR) 20 MG 24 hr capsule Take 1 capsule (20 mg total) by mouth daily. 04/25/18  Yes [name], MD  clonazePAM  (KLONOPIN) 0.5 MG tablet Take 2.5 tablets (1.25 mg total) by mouth daily. 04/25/18  Yes [name], MD  doxycycline (VIBRA-TABS) 100 MG tablet Take 1 tablet (100 mg total) by mouth 2 (two) times daily. 04/25/18  Yes [name], MD  escitalopram (LEXAPRO) 10 MG tablet Take 1 tablet (10 mg total) by mouth daily. 04/25/18  Yes [name], MD  montelukast (SINGULAIR) 10 MG tablet Take 1 tablet (10 mg total) by mouth at bedtime. 04/25/18  Yes [name], MD  Multiple Vitamin (MULTIVITAMIN) tablet Take 1 tablet by mouth daily.   Yes [provider]  naproxen sodium (ALEVE) 220 MG tablet Take 220 mg by mouth daily as needed.   Yes [provider]  tamsulosin (FLOMAX) 0.4 MG CAPS capsule Take 1 capsule (0.4 mg total) by mouth daily. 04/25/18  Yes [name], MD    Past Medical History:  Diagnosis Date  . ADD (attention deficit disorder)   . Allergy   . Anxiety state, unspecified 03/10/2013  . Diverticulitis   . Insomnia   . Long-term current use of testosterone replacement therapy   . Sleep apnea     Past Surgical History:  Procedure Laterality Date  . INGUINAL HERNIA REPAIR Bilateral 01/22/2015   Procedure: LAPAROSCOPIC BILATERAL INGUINAL HERNIA  REPAIR WITH MESH AND UMBILICAL HERNIA REPAIR;  Surgeon: Abigail Miyamoto, MD;  Location: [facility];  Service: General;  Laterality: Bilateral;  . QUADRICEPS TENDON REPAIR  08/2008  . UMBILICAL HERNIA REPAIR N/A 01/22/2015   Procedure: UMBILICAL HERNIA REPAIR;  Surgeon: Abigail Miyamoto, MD;  Location: [facility];  Service: General;  Laterality: N/A;    Social History   Tobacco Use  . Smoking status: Never Smoker  . Smokeless tobacco: Never Used  Substance Use Topics  . Alcohol use: Yes    Comment: rare     Family History  Problem Relation Age of Onset  . Hypertension Father   . Colon polyps Father   . Cancer Maternal Grandmother 79       breast cancer  .  Hypertension Paternal Grandmother   . COPD Paternal Grandfather        lung cancer; tobacco use, Doctor, general practice  . Cancer Maternal Aunt        breast cancer, late 46's  . Breast cancer Maternal Aunt   . Cancer Maternal Aunt        breast cancer; diagnosed late 65's  . Breast cancer Maternal Aunt   . Colon cancer Neg Hx   . Rectal cancer Neg Hx   . Stomach cancer Neg Hx   . Esophageal cancer Neg Hx     ROS Per hpi  OBJECTIVE:  Blood pressure (!) 155/89, pulse 83, temperature 97.6 F (36.4 C), temperature source Oral, resp. rate 18, height 5' 11.5" (1.816 m), weight 249 lb (112.9 kg), SpO2 95 %. Body mass index is 34.24 kg/m.   BP Readings from Last 3 Encounters:  05/22/18 (!) 155/89  04/25/18 138/87  03/27/18 133/88    Physical Exam Vitals signs and nursing note reviewed.  Constitutional:      Appearance: He is well-developed.  HENT:     Head: Normocephalic and atraumatic.  Eyes:     Conjunctiva/sclera: Conjunctivae normal.     Pupils: Pupils are equal, round, and reactive to light.  Neck:     Musculoskeletal: Neck supple.  Pulmonary:     Effort: Pulmonary effort is normal.  Skin:    General: Skin is warm and dry.  Neurological:     Mental Status: He is alert and oriented to person, place, and time.     ASSESSMENT and PLAN  1. Attention deficit hyperactivity disorder (ADHD), combined type Controlled. Cont current med. Meds refilled x 3 months  2. Generalized anxiety disorder Improving. Discussed increasing lexapro to 10mg , continue clonazepam wean. Next goal is 0.75mg  a day.  3. Abnormal liver enzymes Stable. Thought to be 2/2 anabolic steroids which he is weaning off from slowly. Cont to monitor. Patient wants to postpone further workup if LFTs do not normalize once off steroids.  4. BPH associated with nocturia Controlled on flomax.  Other orders - Td vaccine greater than or equal to 7yo preservative free IM - clonazePAM (KLONOPIN) 0.5 MG tablet;  Take 2 tablets (1 mg total) by mouth daily. - amphetamine-dextroamphetamine (ADDERALL XR) 20 MG 24 hr capsule; Take 1 capsule (20 mg total) by mouth daily. - amphetamine-dextroamphetamine (ADDERALL XR) 20 MG 24 hr capsule; Take 1 capsule (20 mg total) by mouth every morning. - amphetamine-dextroamphetamine (ADDERALL XR) 20 MG 24 hr capsule; Take 1 capsule (20 mg total) by mouth every morning.  Return in about 4 weeks (around 06/19/2018).    [name], MD Primary Care at [facility] [address] [place],  [place] [address] Ph.  [phone] Fax [phone]

## 2018-06-14 ENCOUNTER — Telehealth: Payer: Self-pay | Admitting: Family Medicine

## 2018-06-14 MED ORDER — TAMSULOSIN HCL 0.4 MG PO CAPS: 0.8000 mg | ORAL_CAPSULE | Freq: Every day | ORAL | 3 refills | Status: DC

## 2018-06-14 NOTE — Telephone Encounter (Signed)
Copied from CRM [id number]. Topic: Quick Communication - Rx Refill/Question >> Jun 14, 2018  9:21 AM Reggie Pile, NT wrote: Medication:  tamsulosin (FLOMAX) 0.4 MG CAPS capsule  Has the patient contacted their pharmacy? Yes. Patient is requesting a higher dosage at this time. Possibly a .8MG . States .4 MG isn't as affective.  Preferred Pharmacy (with phone number or street name): [id number] - [place], [place] - [address] AT [facility] [address] [phone] (Phone) [phone] (Fax)  Agent: Please be advised that RX refills may take up to 3 business days. We ask that you follow-up with your pharmacy.

## 2018-06-14 NOTE — Telephone Encounter (Signed)
Spoke with patient and he said he would like a stronger dose as this isnt really helping and does have an apt on the 9th if you need to wait to speak with him.

## 2018-06-21 ENCOUNTER — Other Ambulatory Visit: Payer: Self-pay

## 2018-06-21 ENCOUNTER — Telehealth (INDEPENDENT_AMBULATORY_CARE_PROVIDER_SITE_OTHER): Payer: BLUE CROSS/BLUE SHIELD | Admitting: Family Medicine

## 2018-06-21 DIAGNOSIS — F902 Attention-deficit hyperactivity disorder, combined type: Secondary | ICD-10-CM | POA: Diagnosis not present

## 2018-06-21 DIAGNOSIS — Z79899 Other long term (current) drug therapy: Secondary | ICD-10-CM

## 2018-06-21 DIAGNOSIS — N401 Enlarged prostate with lower urinary tract symptoms: Secondary | ICD-10-CM

## 2018-06-21 DIAGNOSIS — F411 Generalized anxiety disorder: Secondary | ICD-10-CM

## 2018-06-21 DIAGNOSIS — R351 Nocturia: Secondary | ICD-10-CM

## 2018-06-21 MED ORDER — CLONAZEPAM 0.5 MG PO TABS: 0.7500 mg | ORAL_TABLET | Freq: Every day | ORAL | 0 refills | Status: DC

## 2018-06-21 NOTE — Progress Notes (Signed)
Chief Complaint : f/u on ADD  PHQ 9 score 4 and GAD score 2  1.  Feeling nervous, anxious, or on edge  1 2.  Not being able to stop or control worrying  0 3.  Worrying too much about different things  0 4.  Trouble relaxing  0 5.  Being so restless that it's hard to sit still  1 6.  Becoming easily annoyed or irritable 0 7.  Feeling afraid as if something awful might happen  0

## 2018-06-21 NOTE — Progress Notes (Signed)
Virtual Visit via telephone Note  I connected with patient on 06/21/18 at 532pm by telephone and verified that I am speaking with the correct person using two identifiers. Jose AllegraChristopher Shaffer is currently located at home and patient is currently with her during visit. The provider, Myles LippsIrma M Santiago, MD is located in their office at time of visit.  I discussed the limitations, risks, security and privacy concerns of performing an evaluation and management service by telephone and the availability of in person appointments. I also discussed with the patient that there may be a patient responsible charge related to this service. The patient expressed understanding and agreed to proceed.  Signing Visit        Chief Complaint : f/u on ADD  PHQ 9 score 4 and GAD score 2        HPI Last OV March 2020 Started flomax 0.4mg  a month ago - was having nocturia 3-4 times a night, sleeping really lightly, started taking about 1/4 tab of trazodone 50mg , helps, now wakes up about once a night, he has also cut back on drinking water later in the day  Has not been exercising as much due to covid 19 Stopped taking allergy meds for unclear reasons and that also was waking him up at night He does wake cracky with trazodone but ok with side effects Taking lexapro 5mg  only and not daily- when he tried to increase dose, it makes fidgety, cloud headed (sleepy) and unable to concentrate. Similar happened paxil.  Gets a bit blunted, similar happened wellbutrin Clonazepam wean going well, 0.5mg  tab taking 1 3/4 tabs  ADD well controlled on current regime, splitting dose, working well  Fall Risk  06/21/2018 05/22/2018 04/25/2018 02/26/2018 01/03/2018  Falls in the past year? 0 0 0 0 No  Number falls in past yr: 0 - 0 - -  Injury with Fall? 0 - 0 - -  Follow up Falls evaluation completed Falls evaluation completed - - -     [facility] 06/21/2018 05/22/2018 04/25/2018  Decreased Interest 0 0 0  Down,  Depressed, Hopeless 0 0 0  PHQ - 2 Score 0 0 0  Altered sleeping 3 - -  Tired, decreased energy 0 - -  Change in appetite 0 - -  Feeling bad or failure about yourself  0 - -  Trouble concentrating 1 - -  Moving slowly or fidgety/restless 0 - -  Suicidal thoughts 0 - -  PHQ-9 Score 4 - -  Difficult doing work/chores Not difficult at all - -    Allergies  Allergen Reactions  . Ibuprofen Other (See Comments)    Per patient feels like KNOT in throat and itching with rash  . Tramadol Anxiety    Prior to Admission medications   Medication Sig Start Date End Date Taking? Authorizing Provider  amphetamine-dextroamphetamine (ADDERALL XR) 20 MG 24 hr capsule Take 1 capsule (20 mg total) by mouth daily. 05/22/18  Yes Myles LippsSantiago, Rupinder Livingston M, MD  amphetamine-dextroamphetamine (ADDERALL XR) 20 MG 24 hr capsule Take 1 capsule (20 mg total) by mouth every morning. 06/22/18  Yes Myles LippsSantiago, Venora Kautzman M, MD  amphetamine-dextroamphetamine (ADDERALL XR) 20 MG 24 hr capsule Take 1 capsule (20 mg total) by mouth every morning. 07/22/18  Yes Myles LippsSantiago, Signe Tackitt M, MD  clonazePAM (KLONOPIN) 0.5 MG tablet Take 2 tablets (1 mg total) by mouth daily. 05/22/18  Yes Myles LippsSantiago, Ted Goodner M, MD  escitalopram (LEXAPRO) 10 MG tablet Take 1 tablet (10 mg total) by  mouth daily. 04/25/18  Yes [name], MD  montelukast (SINGULAIR) 10 MG tablet Take 1 tablet (10 mg total) by mouth at bedtime. 04/25/18  Yes [name], MD  Multiple Vitamin (MULTIVITAMIN) tablet Take 1 tablet by mouth daily.   Yes [provider]  naproxen sodium (ALEVE) 220 MG tablet Take 220 mg by mouth daily as needed.   Yes [provider]  tamsulosin (FLOMAX) 0.4 MG CAPS capsule Take 2 capsules (0.8 mg total) by mouth daily. 06/14/18  Yes [name], MD    Past Medical History:  Diagnosis Date  . ADD (attention deficit disorder)   . Allergy   . Anxiety state, unspecified 03/10/2013  . Diverticulitis   . Insomnia   . Long-term current use  of testosterone replacement therapy   . Sleep apnea     Past Surgical History:  Procedure Laterality Date  . INGUINAL HERNIA REPAIR Bilateral 01/22/2015   Procedure: LAPAROSCOPIC BILATERAL INGUINAL HERNIA REPAIR WITH MESH AND UMBILICAL HERNIA REPAIR;  Surgeon: Abigail Miyamoto, MD;  Location: [facility];  Service: General;  Laterality: Bilateral;  . QUADRICEPS TENDON REPAIR  08/2008  . UMBILICAL HERNIA REPAIR N/A 01/22/2015   Procedure: UMBILICAL HERNIA REPAIR;  Surgeon: Abigail Miyamoto, MD;  Location: [facility];  Service: General;  Laterality: N/A;    Social History   Tobacco Use  . Smoking status: Never Smoker  . Smokeless tobacco: Never Used  Substance Use Topics  . Alcohol use: Yes    Comment: rare     Family History  Problem Relation Age of Onset  . Hypertension Father   . Colon polyps Father   . Cancer Maternal Grandmother 76       breast cancer  . Hypertension Paternal Grandmother   . COPD Paternal Grandfather        lung cancer; tobacco use, Doctor, general practice  . Cancer Maternal Aunt        breast cancer, late 30's  . Breast cancer Maternal Aunt   . Cancer Maternal Aunt        breast cancer; diagnosed late 18's  . Breast cancer Maternal Aunt   . Colon cancer Neg Hx   . Rectal cancer Neg Hx   . Stomach cancer Neg Hx   . Esophageal cancer Neg Hx     ROS Per hpi  Objective  Vitals as reported by the patient: none  There were no vitals filed for this visit.  ASSESSMENT and PLAN  1. Attention deficit hyperactivity disorder (ADHD), combined type Stable. Cont current regime  2. Generalized anxiety disorder Appropriate diagnosis? Will leave meds as is, next visit will complete MDQ9.   3. BPH associated with nocturia Discussed increasing flomax to 0.8mg  daily  4. Encounter for long term benzodiazepine therapy Continue with wean - clonazePAM (KLONOPIN) 0.5 MG tablet; Take 1.5 tablets (0.75 mg total) by mouth daily.   FOLLOW-UP: 4 weeks   The above assessment and management plan was discussed with the patient. The patient verbalized understanding of and has agreed to the management plan. Patient is aware to call the clinic if symptoms persist or worsen. Patient is aware when to return to the clinic for a follow-up visit. Patient educated on when it is appropriate to go to the emergency department.    I provided 30 minutes of non-face-to-face time during this encounter.  [name], MD Primary Care at [facility] [address] [place], [place] [address] Ph.  [phone] Fax [phone]

## 2018-07-02 ENCOUNTER — Other Ambulatory Visit: Payer: Self-pay | Admitting: Family Medicine

## 2018-07-02 NOTE — Telephone Encounter (Signed)
Requested medication (s) are due for refill today - no  Requested medication (s) are on the active medication list -yes  Future visit scheduled -yes  Last refill: 1 week ago- dosing changed- this is old dosing  Notes to clinic: Rx refill received from pharmacy for old dosing- sent for review of request.-Too soon and dosing has changed at last visit.  Requested Prescriptions  Pending Prescriptions Disp Refills   clonazePAM (KLONOPIN) 0.5 MG tablet [Pharmacy Med Name: CLONAZEPAM 0.5MG  TABLETS] 60 tablet     Sig: TAKE 2 TABLETS(1 MG) BY MOUTH DAILY     Not Delegated - Psychiatry:  Anxiolytics/Hypnotics Failed - 07/02/2018 11:25 AM      Failed - This refill cannot be delegated      Failed - Urine Drug Screen completed in last 360 days.      Passed - Valid encounter within last 6 months    Recent Outpatient Visits          1 month ago Attention deficit hyperactivity disorder (ADHD), combined type   Primary Care at [facility], Meda Coffee, MD   2 months ago Attention deficit hyperactivity disorder (ADHD), combined type   [facility] at [facility], Meda Coffee, MD   3 months ago Attention deficit hyperactivity disorder (ADHD), combined type   [facility] at [facility], Meda Coffee, MD   4 months ago Attention deficit hyperactivity disorder (ADHD), combined type   Primary Care at [facility], Meda Coffee, MD   6 months ago Anxiety state   Primary Care at [facility], Meda Coffee, MD      Future Appointments            In 3 months [name], MD Primary Care at [place], [facility]            Requested Prescriptions  Pending Prescriptions Disp Refills   clonazePAM (KLONOPIN) 0.5 MG tablet [Pharmacy Med Name: CLONAZEPAM 0.5MG  TABLETS] 60 tablet     Sig: TAKE 2 TABLETS(1 MG) BY MOUTH DAILY     Not Delegated - Psychiatry:  Anxiolytics/Hypnotics Failed - 07/02/2018 11:25 AM      Failed - This refill cannot be delegated      Failed - Urine Drug Screen completed in last 360 days.       Passed - Valid encounter within last 6 months    Recent Outpatient Visits          1 month ago Attention deficit hyperactivity disorder (ADHD), combined type   Primary Care at [facility], Meda Coffee, MD   2 months ago Attention deficit hyperactivity disorder (ADHD), combined type   [facility] Care at [facility], Meda Coffee, MD   3 months ago Attention deficit hyperactivity disorder (ADHD), combined type   Primary Care at [facility], Meda Coffee, MD   4 months ago Attention deficit hyperactivity disorder (ADHD), combined type   Primary Care at [facility], Meda Coffee, MD   6 months ago Anxiety state   Primary Care at [facility], Meda Coffee, MD      Future Appointments            In 3 months [name], MD Primary Care at [place], [facility]

## 2018-07-05 ENCOUNTER — Other Ambulatory Visit: Payer: Self-pay | Admitting: Family Medicine

## 2018-07-27 ENCOUNTER — Telehealth: Payer: Self-pay | Admitting: Family Medicine

## 2018-07-27 MED ORDER — AMPHETAMINE-DEXTROAMPHET ER 20 MG PO CP24: 20.0000 mg | ORAL_CAPSULE | Freq: Every day | ORAL | 0 refills | Status: DC

## 2018-07-27 MED ORDER — AMPHETAMINE-DEXTROAMPHET ER 20 MG PO CP24: 20.0000 mg | ORAL_CAPSULE | ORAL | 0 refills | Status: DC

## 2018-07-27 NOTE — Telephone Encounter (Signed)
pmp reviewed. Med refilled x 3 months 

## 2018-07-27 NOTE — Telephone Encounter (Signed)
Copied from CRM [id number]. Topic: Quick Communication - Rx Refill/Question >> Jul 27, 2018  8:34 AM Crist Infante wrote: Medication: amphetamine-dextroamphetamine (ADDERALL XR) 20 MG 24 hr capsule  Pt had med follow up on 06/21/18 and requesting refill  [id number] - [place], [place] - [address] AT [facility] [address] [phone] (Phone) [phone] (Fax)

## 2018-07-27 NOTE — Telephone Encounter (Signed)
Please refill if possible

## 2018-08-18 ENCOUNTER — Other Ambulatory Visit: Payer: Self-pay | Admitting: Family Medicine

## 2018-08-18 NOTE — Telephone Encounter (Signed)
Requested Prescriptions  Pending Prescriptions Disp Refills  . montelukast (SINGULAIR) 10 MG tablet [Pharmacy Med Name: MONTELUKAST 10MG  TABLETS] 90 tablet 0    Sig: TAKE 1 TABLET(10 MG) BY MOUTH AT BEDTIME     Pulmonology:  Leukotriene Inhibitors Passed - 08/18/2018  2:21 PM      Passed - Valid encounter within last 12 months    Recent Outpatient Visits          2 months ago Attention deficit hyperactivity disorder (ADHD), combined type   Primary Care at [facility], Lilia Argue, MD   3 months ago Attention deficit hyperactivity disorder (ADHD), combined type   [facility] at [facility], Lilia Argue, MD   4 months ago Attention deficit hyperactivity disorder (ADHD), combined type   [facility] at [facility], Lilia Argue, MD   5 months ago Attention deficit hyperactivity disorder (ADHD), combined type   Primary Care at [facility], Lilia Argue, MD   7 months ago Anxiety state   Primary Care at [facility], Lilia Argue, MD      Future Appointments            In 2 months Rutherford Guys, MD Primary Care at [place], [facility]

## 2018-08-20 ENCOUNTER — Telehealth: Payer: BLUE CROSS/BLUE SHIELD | Admitting: Family Medicine

## 2018-08-21 ENCOUNTER — Other Ambulatory Visit: Payer: Self-pay

## 2018-08-21 ENCOUNTER — Telehealth (INDEPENDENT_AMBULATORY_CARE_PROVIDER_SITE_OTHER): Payer: BC Managed Care – PPO | Admitting: Registered Nurse

## 2018-08-21 DIAGNOSIS — N401 Enlarged prostate with lower urinary tract symptoms: Secondary | ICD-10-CM

## 2018-08-21 DIAGNOSIS — R351 Nocturia: Secondary | ICD-10-CM

## 2018-08-21 DIAGNOSIS — F902 Attention-deficit hyperactivity disorder, combined type: Secondary | ICD-10-CM

## 2018-08-21 MED ORDER — AMPHETAMINE-DEXTROAMPHET ER 20 MG PO CP24: 20.0000 mg | ORAL_CAPSULE | ORAL | 0 refills | Status: DC

## 2018-08-21 MED ORDER — DOXAZOSIN MESYLATE 1 MG PO TABS: ORAL_TABLET | ORAL | 0 refills | Status: DC

## 2018-08-21 NOTE — Progress Notes (Signed)
Telemedicine Encounter- SOAP NOTE Established Patient  This telephone encounter was conducted with the patient's (or proxy's) verbal consent via audio telecommunications: yes  Patient was instructed to have this encounter in a suitably private space; and to only have persons present to whom they give permission to participate. In addition, patient identity was confirmed by use of name plus two identifiers (DOB and address).  I discussed the limitations, risks, security and privacy concerns of performing an evaluation and management service by telephone and the availability of in person appointments. I also discussed with the patient that there may be a patient responsible charge related to this service. The patient expressed understanding and agreed to proceed.  I spent a total of 22 minutes talking with the patient or their proxy.  No chief complaint on file.   Subjective   Jose Shaffer is a 48 y.o. established patient. Telephone visit today to discuss current medications  HPI Pt with hx of BPH w/ nocturia - wakes every 2 hours or so to void.  Started on tamsulosin 0.4mg  PO qpm with ramp to 0.8mg  PO qpm - he took it in evenings to help avoid complications from the hypotension he experienced, he noticed that he felt dizzy/fatigued when taking it as well. He states that he is unsure if it helped with his voiding. We discussed that these are unfortunately expected side effects of tamsulosin, but there are other medicaitons that may be helpful for him. We discussed starting on doxazosin 1mg  PO qd - the side effect profile, while similar, is not entirely the same and we are optimistic that he will have better luck with this medication. An additional benefit is the ability to titrate between 4 different doses as we hope to reach therapeutic effect without giving adverse effects.   He additionally was interested in discussing his montelukast: he has noted that it had helped with asthma and  allergy symptoms in addition to sleep. We discussed that this is not unexpected and he was pleased with this effect.   Additionally, he needs  Refill on his adderall today.    Patient Active Problem List   Diagnosis Date Noted  . Abdominal pain, left lower quadrant 11/12/2014  . Sleep apnea 04/15/2014  . Snoring 04/15/2014  . Insomnia 03/10/2013  . Anxiety state 03/10/2013  . ADD (attention deficit disorder) 09/11/2011    Past Medical History:  Diagnosis Date  . ADD (attention deficit disorder)   . Allergy   . Anxiety state, unspecified 03/10/2013  . Diverticulitis   . Insomnia   . Long-term current use of testosterone replacement therapy   . Sleep apnea     Current Outpatient Medications  Medication Sig Dispense Refill  . amphetamine-dextroamphetamine (ADDERALL XR) 20 MG 24 hr capsule Take 1 capsule (20 mg total) by mouth daily. Ok to fill 60 days after signature 30 capsule 0  . amphetamine-dextroamphetamine (ADDERALL XR) 20 MG 24 hr capsule Take 1 capsule (20 mg total) by mouth every morning. Ok to fill 30 days after signature 30 capsule 0  . amphetamine-dextroamphetamine (ADDERALL XR) 20 MG 24 hr capsule Take 1 capsule (20 mg total) by mouth every morning. 30 capsule 0  . escitalopram (LEXAPRO) 10 MG tablet Take 1 tablet (10 mg total) by mouth daily. 30 tablet 2  . fluticasone (FLONASE) 50 MCG/ACT nasal spray Place into both nostrils daily.    . montelukast (SINGULAIR) 10 MG tablet TAKE 1 TABLET(10 MG) BY MOUTH AT BEDTIME 90 tablet 0  .  Multiple Vitamin (MULTIVITAMIN) tablet Take 1 tablet by mouth daily.    . naproxen sodium (ALEVE) 220 MG tablet Take 220 mg by mouth daily as needed.    . tamsulosin (FLOMAX) 0.4 MG CAPS capsule Take 2 capsules (0.8 mg total) by mouth daily. 60 capsule 3   No current facility-administered medications for this visit.     Allergies  Allergen Reactions  . Ibuprofen Other (See Comments)    Per patient feels like KNOT in throat and itching  with rash  . Tramadol Anxiety    Social History   Socioeconomic History  . Marital status: Married    Spouse name: Herbie Saxon  . Number of children: 0  . Years of education: 12+  . Highest education level: Not on file  Occupational History  . Occupation: truck Education administrator: SELF-EMPLOYED  . Occupation: Museum/gallery curator  . Financial resource strain: Not on file  . Food insecurity:    Worry: Not on file    Inability: Not on file  . Transportation needs:    Medical: Not on file    Non-medical: Not on file  Tobacco Use  . Smoking status: Never Smoker  . Smokeless tobacco: Never Used  Substance and Sexual Activity  . Alcohol use: Yes    Comment: rare   . Drug use: No  . Sexual activity: Yes    Partners: Female    Birth control/protection: None  Lifestyle  . Physical activity:    Days per week: Not on file    Minutes per session: Not on file  . Stress: Not on file  Relationships  . Social connections:    Talks on phone: Not on file    Gets together: Not on file    Attends religious service: Not on file    Active member of club or organization: Not on file    Attends meetings of clubs or organizations: Not on file    Relationship status: Not on file  . Intimate partner violence:    Fear of current or ex partner: Not on file    Emotionally abused: Not on file    Physically abused: Not on file    Forced sexual activity: Not on file  Other Topics Concern  . Not on file  Social History Narrative   Lives with wife, Herbie Saxon   Caffeine 1 cup avg twice weekly    Review of Systems  Constitutional: Positive for malaise/fatigue.  HENT: Negative.   Eyes: Negative.   Respiratory: Negative.   Cardiovascular: Negative.   Gastrointestinal: Negative.   Genitourinary: Positive for frequency.  Musculoskeletal: Negative.   Skin: Negative.   Neurological: Positive for dizziness. Negative for weakness.  Endo/Heme/Allergies: Negative.   Psychiatric/Behavioral: Negative.      Objective   Vitals as reported by the patient: There were no vitals filed for this visit.  There are no diagnoses linked to this encounter.  PLAN:  Start doxazosin 2mg  PO qd for two weeks, then increase dose to 2mg  PO qd for 2 weeks. Follow up appt after this for med check, refill  Continue to monitor signs and symptoms - if you notice dizziness, weakness, or worsening symptoms, please call our clinic.  Patient encouraged to call clinic with any questions, comments, or concerns.   I discussed the assessment and treatment plan with the patient. The patient was provided an opportunity to ask questions and all were answered. The patient agreed with the plan and demonstrated an understanding of the  instructions.   The patient was advised to call back or seek an in-person evaluation if the symptoms worsen or if the condition fails to improve as anticipated.  I provided 22 minutes of non-face-to-face time during this encounter, more than 50% was spent counseling/educating.  Janeece Ageeichard Corey Laski, NP  [facility]

## 2018-08-21 NOTE — Progress Notes (Signed)
Pt states he needs to discuss some of this medication. Pt states his Flomax is not working. He states it is making him restless and fatigue. He also states the singular was making him tired so he started Flonase.  May need a refill on Adderall.

## 2018-08-22 MED ORDER — AMPHETAMINE-DEXTROAMPHET ER 20 MG PO CP24: 20.0000 mg | ORAL_CAPSULE | ORAL | 0 refills | Status: DC

## 2018-08-22 MED ORDER — AMPHETAMINE-DEXTROAMPHET ER 20 MG PO CP24: 20.0000 mg | ORAL_CAPSULE | Freq: Every day | ORAL | 0 refills | Status: DC

## 2018-08-22 NOTE — Progress Notes (Signed)
pmp reviewed adderral refilled. Saw [facility]row yesterday, flomax changed to cardura

## 2018-08-22 NOTE — Addendum Note (Signed)
Addended by: Rutherford Guys on: 08/22/2018 01:45 PM   Modules accepted: Orders

## 2018-09-09 ENCOUNTER — Encounter (HOSPITAL_BASED_OUTPATIENT_CLINIC_OR_DEPARTMENT_OTHER): Payer: Self-pay | Admitting: *Deleted

## 2018-09-09 ENCOUNTER — Other Ambulatory Visit: Payer: Self-pay

## 2018-09-09 ENCOUNTER — Inpatient Hospital Stay (HOSPITAL_BASED_OUTPATIENT_CLINIC_OR_DEPARTMENT_OTHER)
Admission: EM | Admit: 2018-09-09 | Discharge: 2018-09-11 | DRG: 390 | Disposition: A | Discharge status: Home / Self Care | Payer: BC Managed Care – PPO | Attending: General Surgery | Admitting: General Surgery

## 2018-09-09 ENCOUNTER — Emergency Department (HOSPITAL_BASED_OUTPATIENT_CLINIC_OR_DEPARTMENT_OTHER): Payer: BC Managed Care – PPO

## 2018-09-09 DIAGNOSIS — K56609 Unspecified intestinal obstruction, unspecified as to partial versus complete obstruction: Secondary | ICD-10-CM | POA: Diagnosis not present

## 2018-09-09 DIAGNOSIS — Z7951 Long term (current) use of inhaled steroids: Secondary | ICD-10-CM

## 2018-09-09 DIAGNOSIS — G4730 Sleep apnea, unspecified: Secondary | ICD-10-CM | POA: Diagnosis present

## 2018-09-09 DIAGNOSIS — G4700 Insomnia, unspecified: Secondary | ICD-10-CM | POA: Diagnosis present

## 2018-09-09 DIAGNOSIS — Z79899 Other long term (current) drug therapy: Secondary | ICD-10-CM

## 2018-09-09 DIAGNOSIS — F909 Attention-deficit hyperactivity disorder, unspecified type: Secondary | ICD-10-CM | POA: Diagnosis present

## 2018-09-09 DIAGNOSIS — F419 Anxiety disorder, unspecified: Secondary | ICD-10-CM | POA: Diagnosis present

## 2018-09-09 DIAGNOSIS — Z0189 Encounter for other specified special examinations: Secondary | ICD-10-CM

## 2018-09-09 DIAGNOSIS — Z885 Allergy status to narcotic agent status: Secondary | ICD-10-CM

## 2018-09-09 DIAGNOSIS — Z8249 Family history of ischemic heart disease and other diseases of the circulatory system: Secondary | ICD-10-CM

## 2018-09-09 DIAGNOSIS — Z1159 Encounter for screening for other viral diseases: Secondary | ICD-10-CM

## 2018-09-09 DIAGNOSIS — Z886 Allergy status to analgesic agent status: Secondary | ICD-10-CM

## 2018-09-09 LAB — COMPREHENSIVE METABOLIC PANEL
ALT: 57 U/L — ABNORMAL HIGH (ref 0–44)
AST: 35 U/L (ref 15–41)
Albumin: 4.4 g/dL (ref 3.5–5.0)
Alkaline Phosphatase: 45 U/L (ref 38–126)
Anion gap: 11 (ref 5–15)
BUN: 14 mg/dL (ref 6–20)
CO2: 25 mmol/L (ref 22–32)
Calcium: 9.5 mg/dL (ref 8.9–10.3)
Chloride: 100 mmol/L (ref 98–111)
Creatinine, Ser: 1.18 mg/dL (ref 0.61–1.24)
GFR calc Af Amer: >60 mL/min (ref >60)
GFR calc non Af Amer: >60 mL/min (ref >60)
Glucose, Bld: 109 mg/dL — ABNORMAL HIGH (ref 70–99)
Potassium: 4 mmol/L (ref 3.5–5.1)
Sodium: 136 mmol/L (ref 135–145)
Total Bilirubin: 1.3 mg/dL — ABNORMAL HIGH (ref 0.3–1.2)
Total Protein: 7.1 g/dL (ref 6.5–8.1)

## 2018-09-09 LAB — CBC WITH DIFFERENTIAL/PLATELET
Abs Immature Granulocytes: 0.04 10*3/uL (ref 0.00–0.07)
Basophils Absolute: 0.1 10*3/uL (ref 0.0–0.1)
Basophils Relative: 1 %
Eosinophils Absolute: 0.1 10*3/uL (ref 0.0–0.5)
Eosinophils Relative: 1 %
HCT: 59.2 % — ABNORMAL HIGH (ref 39.0–52.0)
Hemoglobin: 19.2 g/dL — ABNORMAL HIGH (ref 13.0–17.0)
Immature Granulocytes: 0 %
Lymphocytes Relative: 9 %
Lymphs Abs: 1.3 10*3/uL (ref 0.7–4.0)
MCH: 28.3 pg (ref 26.0–34.0)
MCHC: 32.4 g/dL (ref 30.0–36.0)
MCV: 87.2 fL (ref 80.0–100.0)
Monocytes Absolute: 1.2 10*3/uL — ABNORMAL HIGH (ref 0.1–1.0)
Monocytes Relative: 8 %
Neutro Abs: 11.4 10*3/uL — ABNORMAL HIGH (ref 1.7–7.7)
Neutrophils Relative %: 81 %
Platelets: 264 10*3/uL (ref 150–400)
RBC: 6.79 MIL/uL — ABNORMAL HIGH (ref 4.22–5.81)
RDW: 15 % (ref 11.5–15.5)
WBC: 14.1 10*3/uL — ABNORMAL HIGH (ref 4.0–10.5)
nRBC: 0 % (ref 0.0–0.2)

## 2018-09-09 LAB — LIPASE, BLOOD: Lipase: 33 U/L (ref 11–51)

## 2018-09-09 MED ORDER — IOHEXOL 300 MG/ML  SOLN
100.0000 mL | Freq: Once | INTRAMUSCULAR | Status: AC | PRN
  Administered 2018-09-09: 100 mL via INTRAVENOUS

## 2018-09-09 MED ORDER — MORPHINE SULFATE (PF) 4 MG/ML IV SOLN
4.0000 mg | Freq: Once | INTRAVENOUS | Status: AC
  Administered 2018-09-09: 4 mg via INTRAVENOUS
  Filled 2018-09-09: qty 1

## 2018-09-09 MED ORDER — LACTATED RINGERS IV BOLUS
1000.0000 mL | Freq: Once | INTRAVENOUS | Status: AC
  Administered 2018-09-09: 1000 mL via INTRAVENOUS

## 2018-09-09 MED ORDER — ONDANSETRON HCL 4 MG/2ML IJ SOLN
4.0000 mg | Freq: Once | INTRAMUSCULAR | Status: AC
  Administered 2018-09-09: 4 mg via INTRAVENOUS
  Filled 2018-09-09: qty 2

## 2018-09-09 NOTE — ED Notes (Signed)
CT awaiting results from [facility] prior to imaging; per radiology protocol

## 2018-09-09 NOTE — ED Provider Notes (Signed)
[facility] Provider Note   CSN: [id number] Arrival date & time: 09/09/18  2148     History   Chief Complaint Chief Complaint  Patient presents with   Abdominal Pain    HPI Jose Shaffer is a 48 y.o. male.     Patient is a 48 year old male with a history of diverticulitis, umbilical and inguinal hernia repair and prior partial small bowel obstruction who is presenting today with abdominal distention and pain.  Patient states for the last 2 weeks he has had significant abdominal bloating and decreased caliber of stools feeling like he is not completely emptying.  He denies any rectal pain, nausea or vomiting but states since yesterday he has had abdominal pain in the periumbilical region that is progressively worsening.  It is now 8 out of 10 and achy and cramping in nature.  It is worse with movement or trying to eat.  He has not been eating or drinking anything but states he did have a very small stool this morning but decreased flatus.  He is urinating normally.  He denies cough, congestion, shortness of breath or fever.  The history is provided by the patient.  Abdominal Pain   Past Medical History:  Diagnosis Date   ADD (attention deficit disorder)    Allergy    Anxiety state, unspecified 03/10/2013   Diverticulitis    Insomnia    Long-term current use of testosterone replacement therapy    Sleep apnea     Patient Active Problem List   Diagnosis Date Noted   Abdominal pain, left lower quadrant 11/12/2014   Sleep apnea 04/15/2014   Snoring 04/15/2014   Insomnia 03/10/2013   Anxiety state 03/10/2013   ADD (attention deficit disorder) 09/11/2011    Past Surgical History:  Procedure Laterality Date   INGUINAL HERNIA REPAIR Bilateral 01/22/2015   Procedure: LAPAROSCOPIC BILATERAL INGUINAL HERNIA REPAIR WITH MESH AND UMBILICAL HERNIA REPAIR;  Surgeon: [name], MD;  Location: [facility];   Service: General;  Laterality: Bilateral;   QUADRICEPS TENDON REPAIR  19/1478   UMBILICAL HERNIA REPAIR N/A 01/22/2015   Procedure: UMBILICAL HERNIA REPAIR;  Surgeon: [name], MD;  Location: [place];  Service: General;  Laterality: N/A;        Home Medications    Prior to Admission medications   Medication Sig Start Date End Date Taking? Authorizing Provider  amphetamine-dextroamphetamine (ADDERALL XR) 20 MG 24 hr capsule Take 1 capsule (20 mg total) by mouth every morning. 08/22/18  Yes Rutherford Guys, MD  doxazosin (CARDURA) 1 MG tablet Take 1 tablet (1 mg total) by mouth daily for 14 days, THEN 2 tablets (2 mg total) daily for 14 days. 08/21/18 09/18/18 Yes Maximiano Coss, NP  montelukast (SINGULAIR) 10 MG tablet Take 10 mg by mouth at bedtime.   Yes [provider]  amphetamine-dextroamphetamine (ADDERALL XR) 20 MG 24 hr capsule Take 1 capsule (20 mg total) by mouth every morning. Ok to fill 30 days after signature 08/22/18   Rutherford Guys, MD  amphetamine-dextroamphetamine (ADDERALL XR) 20 MG 24 hr capsule Take 1 capsule (20 mg total) by mouth daily. Ok to fill 60 days after signature 08/22/18   Rutherford Guys, MD  escitalopram (LEXAPRO) 10 MG tablet Take 1 tablet (10 mg total) by mouth daily. 04/25/18   Rutherford Guys, MD  fluticasone Asencion Islam) 50 MCG/ACT nasal spray Place into both nostrils daily.    [provider]  Multiple Vitamin (  MULTIVITAMIN) tablet Take 1 tablet by mouth daily.    [provider]  naproxen sodium (ALEVE) 220 MG tablet Take 220 mg by mouth daily as needed.    [provider]    Family History Family History  Problem Relation Age of Onset   Hypertension Father    Colon polyps Father    Cancer Maternal Grandmother [id number]       breast cancer   Hypertension Paternal Grandmother    COPD Paternal Grandfather        lung cancer; tobacco use, Doctor, general practicesteel mill worker   Cancer Maternal Aunt         breast cancer, late 50's   Breast cancer Maternal Aunt    Cancer Maternal Aunt        breast cancer; diagnosed late 50's   Breast cancer Maternal Aunt    Colon cancer Neg Hx    Rectal cancer Neg Hx    Stomach cancer Neg Hx    Esophageal cancer Neg Hx     Social History Social History   Tobacco Use   Smoking status: Never Smoker   Smokeless tobacco: Never Used  Substance Use Topics   Alcohol use: Yes    Comment: rare    Drug use: No     Allergies   Ibuprofen and Tramadol   Review of Systems Review of Systems  Gastrointestinal: Positive for abdominal pain.  All other systems reviewed and are negative.    Physical Exam Updated Vital Signs BP (!) 174/102 (BP Location: Right Arm)    Pulse 90    Temp 98.2 F (36.8 C) (Oral)    Resp 18    Ht 5\' 10"  (1.778 m)    Wt 106.6 kg    SpO2 98%    BMI 33.72 kg/m   Physical Exam Vitals signs and nursing note reviewed.  Constitutional:      General: He is not in acute distress.    Appearance: He is well-developed.  HENT:     Head: Normocephalic and atraumatic.  Eyes:     Conjunctiva/sclera: Conjunctivae normal.     Pupils: Pupils are equal, round, and reactive to light.  Neck:     Musculoskeletal: Normal range of motion and neck supple.  Cardiovascular:     Rate and Rhythm: Normal rate and regular rhythm.     Heart sounds: No murmur.  Pulmonary:     Effort: Pulmonary effort is normal. No respiratory distress.     Breath sounds: Normal breath sounds. No wheezing or rales.  Abdominal:     General: Bowel sounds are decreased. There is distension.     Palpations: Abdomen is soft.     Tenderness: There is abdominal tenderness in the periumbilical area. There is no right CVA tenderness, left CVA tenderness, guarding or rebound.     Hernia: There is no hernia in the umbilical area.     Comments: No palpable hernias but patient has a surgical stitch still present in his umbilicus  Musculoskeletal: Normal range of  motion.        General: No tenderness.  Skin:    General: Skin is warm and dry.     Capillary Refill: Capillary refill takes less than 2 seconds.     Findings: No erythema or rash.  Neurological:     General: No focal deficit present.     Mental Status: He is alert and oriented to person, place, and time.  Psychiatric:        Mood  and Affect: Mood normal.        Behavior: Behavior normal.      ED Treatments / Results  Labs (all labs ordered are listed, but only abnormal results are displayed) Labs Reviewed  CBC WITH DIFFERENTIAL/PLATELET  COMPREHENSIVE METABOLIC PANEL  LIPASE, BLOOD  URINALYSIS, ROUTINE W REFLEX MICROSCOPIC    EKG    Radiology No results found.  Procedures Procedures (including critical care time)  Medications Ordered in ED Medications  morphine 4 MG/ML injection 4 mg (has no administration in time range)  ondansetron (ZOFRAN) injection 4 mg (has no administration in time range)  lactated ringers bolus 1,000 mL (has no administration in time range)     Initial Impression / Assessment and Plan / ED Course  I have reviewed the triage vital signs and the nursing notes.  Pertinent labs & imaging results that were available during my care of the patient were reviewed by me and considered in my medical decision making (see chart for details).        Patient presenting with abdominal distention and pain.  Decreased stools and flatus.  On exam patient has no evidence of incarcerated hernia but does have some tenderness and decreased bowel sounds.  Concern for recurrent diverticulitis versus small bowel obstruction.  Patient is hypertensive here but afebrile with normal O2 sat.  He denies any symptoms consistent with COVID.  Labs and CT pending.  Patient given IV fluids and pain control.  Final Clinical Impressions(s) / ED Diagnoses   Final diagnoses:  None    ED Discharge Orders    None       Gwyneth SproutPlunkett, Kashmir Leedy, MD 09/09/18 2309

## 2018-09-09 NOTE — ED Triage Notes (Signed)
Pt reports mid abdominal pain since yesterday, worse today. Reports loose BM this morning. States he has been bloated x 2 weeks

## 2018-09-10 ENCOUNTER — Observation Stay (HOSPITAL_BASED_OUTPATIENT_CLINIC_OR_DEPARTMENT_OTHER): Payer: BC Managed Care – PPO

## 2018-09-10 ENCOUNTER — Observation Stay (HOSPITAL_COMMUNITY): Payer: BC Managed Care – PPO

## 2018-09-10 DIAGNOSIS — F909 Attention-deficit hyperactivity disorder, unspecified type: Secondary | ICD-10-CM | POA: Diagnosis not present

## 2018-09-10 DIAGNOSIS — Z79899 Other long term (current) drug therapy: Secondary | ICD-10-CM | POA: Diagnosis not present

## 2018-09-10 DIAGNOSIS — Z7951 Long term (current) use of inhaled steroids: Secondary | ICD-10-CM | POA: Diagnosis not present

## 2018-09-10 DIAGNOSIS — K56609 Unspecified intestinal obstruction, unspecified as to partial versus complete obstruction: Secondary | ICD-10-CM | POA: Diagnosis present

## 2018-09-10 DIAGNOSIS — Z885 Allergy status to narcotic agent status: Secondary | ICD-10-CM | POA: Diagnosis not present

## 2018-09-10 DIAGNOSIS — Z8249 Family history of ischemic heart disease and other diseases of the circulatory system: Secondary | ICD-10-CM | POA: Diagnosis not present

## 2018-09-10 DIAGNOSIS — F419 Anxiety disorder, unspecified: Secondary | ICD-10-CM | POA: Diagnosis present

## 2018-09-10 DIAGNOSIS — Z886 Allergy status to analgesic agent status: Secondary | ICD-10-CM | POA: Diagnosis not present

## 2018-09-10 DIAGNOSIS — Z1159 Encounter for screening for other viral diseases: Secondary | ICD-10-CM | POA: Diagnosis not present

## 2018-09-10 DIAGNOSIS — G4700 Insomnia, unspecified: Secondary | ICD-10-CM | POA: Diagnosis not present

## 2018-09-10 DIAGNOSIS — G4730 Sleep apnea, unspecified: Secondary | ICD-10-CM | POA: Diagnosis not present

## 2018-09-10 LAB — MAGNESIUM: Magnesium: 1.9 mg/dL (ref 1.7–2.4)

## 2018-09-10 LAB — URINALYSIS, ROUTINE W REFLEX MICROSCOPIC
Bilirubin Urine: NEGATIVE
Glucose, UA: NEGATIVE mg/dL
Hgb urine dipstick: NEGATIVE
Ketones, ur: 15 mg/dL — AB
Leukocytes,Ua: NEGATIVE
Nitrite: NEGATIVE
Protein, ur: NEGATIVE mg/dL
Specific Gravity, Urine: <1.005 — ABNORMAL LOW (ref 1.005–1.030)
pH: 7 (ref 5.0–8.0)

## 2018-09-10 LAB — BASIC METABOLIC PANEL
Anion gap: 8 (ref 5–15)
BUN: 12 mg/dL (ref 6–20)
CO2: 29 mmol/L (ref 22–32)
Calcium: 9.1 mg/dL (ref 8.9–10.3)
Chloride: 102 mmol/L (ref 98–111)
Creatinine, Ser: 1.28 mg/dL — ABNORMAL HIGH (ref 0.61–1.24)
GFR calc Af Amer: >60 mL/min (ref >60)
GFR calc non Af Amer: >60 mL/min (ref >60)
Glucose, Bld: 109 mg/dL — ABNORMAL HIGH (ref 70–99)
Potassium: 3.9 mmol/L (ref 3.5–5.1)
Sodium: 139 mmol/L (ref 135–145)

## 2018-09-10 LAB — HIV ANTIBODY (ROUTINE TESTING W REFLEX): HIV Screen 4th Generation wRfx: NONREACTIVE

## 2018-09-10 LAB — CBC
HCT: 56.3 % — ABNORMAL HIGH (ref 39.0–52.0)
Hemoglobin: 17.8 g/dL — ABNORMAL HIGH (ref 13.0–17.0)
MCH: 28.5 pg (ref 26.0–34.0)
MCHC: 31.6 g/dL (ref 30.0–36.0)
MCV: 90.1 fL (ref 80.0–100.0)
Platelets: 259 10*3/uL (ref 150–400)
RBC: 6.25 MIL/uL — ABNORMAL HIGH (ref 4.22–5.81)
RDW: 14.9 % (ref 11.5–15.5)
WBC: 12.8 10*3/uL — ABNORMAL HIGH (ref 4.0–10.5)
nRBC: 0 % (ref 0.0–0.2)

## 2018-09-10 LAB — SARS CORONAVIRUS 2 AG (30 MIN TAT): SARS Coronavirus 2 Ag: NEGATIVE

## 2018-09-10 MED ORDER — BENZOCAINE 20 % MT AERO
INHALATION_SPRAY | OROMUCOSAL | Status: AC
  Administered 2018-09-10: 02:00:00
  Filled 2018-09-10: qty 57

## 2018-09-10 MED ORDER — ENOXAPARIN SODIUM 40 MG/0.4ML ~~LOC~~ SOLN
40.0000 mg | Freq: Every day | SUBCUTANEOUS | Status: DC
  Administered 2018-09-10 – 2018-09-11 (×2): 40 mg via SUBCUTANEOUS
  Filled 2018-09-10 (×2): qty 0.4

## 2018-09-10 MED ORDER — HYDROMORPHONE HCL 1 MG/ML IJ SOLN
0.5000 mg | INTRAMUSCULAR | Status: DC | PRN
  Administered 2018-09-10: 0.5 mg via INTRAVENOUS
  Filled 2018-09-10: qty 0.5

## 2018-09-10 MED ORDER — HYDRALAZINE HCL 20 MG/ML IJ SOLN: 10.0000 mg | INTRAMUSCULAR | Status: DC | PRN

## 2018-09-10 MED ORDER — HYDROMORPHONE HCL 1 MG/ML IJ SOLN
1.0000 mg | INTRAMUSCULAR | Status: DC | PRN
  Administered 2018-09-10 (×2): 1 mg via INTRAVENOUS
  Filled 2018-09-10 (×2): qty 1

## 2018-09-10 MED ORDER — DIPHENHYDRAMINE HCL 25 MG PO CAPS: 25.0000 mg | ORAL_CAPSULE | Freq: Four times a day (QID) | ORAL | Status: DC | PRN

## 2018-09-10 MED ORDER — METOPROLOL TARTRATE 5 MG/5ML IV SOLN
5.0000 mg | Freq: Four times a day (QID) | INTRAVENOUS | Status: DC | PRN
  Filled 2018-09-10: qty 5

## 2018-09-10 MED ORDER — LORAZEPAM 2 MG/ML IJ SOLN
0.5000 mg | Freq: Four times a day (QID) | INTRAMUSCULAR | Status: DC | PRN
  Administered 2018-09-10 (×2): 0.5 mg via INTRAVENOUS
  Filled 2018-09-10 (×2): qty 1

## 2018-09-10 MED ORDER — SODIUM CHLORIDE 0.9 % IV SOLN
INTRAVENOUS | Status: DC
  Administered 2018-09-10: 11:00:00 via INTRAVENOUS

## 2018-09-10 MED ORDER — PHENOL 1.4 % MT LIQD
1.0000 | OROMUCOSAL | Status: DC | PRN
  Administered 2018-09-10: 1 via OROMUCOSAL
  Filled 2018-09-10: qty 177

## 2018-09-10 MED ORDER — DIATRIZOATE MEGLUMINE & SODIUM 66-10 % PO SOLN
90.0000 mL | Freq: Once | ORAL | Status: AC
  Administered 2018-09-10: 90 mL via NASOGASTRIC
  Filled 2018-09-10: qty 90

## 2018-09-10 MED ORDER — ONDANSETRON HCL 4 MG/2ML IJ SOLN
4.0000 mg | Freq: Three times a day (TID) | INTRAMUSCULAR | Status: DC | PRN
  Administered 2018-09-10: 4 mg via INTRAVENOUS
  Filled 2018-09-10 (×2): qty 2

## 2018-09-10 MED ORDER — DIPHENHYDRAMINE HCL 50 MG/ML IJ SOLN: 25.0000 mg | Freq: Four times a day (QID) | INTRAMUSCULAR | Status: DC | PRN

## 2018-09-10 MED ORDER — SODIUM CHLORIDE 0.9 % IV SOLN
INTRAVENOUS | Status: AC
  Administered 2018-09-10: 03:00:00 via INTRAVENOUS

## 2018-09-10 MED ORDER — PANTOPRAZOLE SODIUM 40 MG IV SOLR: 40.0000 mg | Freq: Every day | INTRAVENOUS | Status: DC

## 2018-09-10 MED ORDER — PANTOPRAZOLE SODIUM 40 MG IV SOLR
40.0000 mg | Freq: Two times a day (BID) | INTRAVENOUS | Status: DC
  Administered 2018-09-10 – 2018-09-11 (×2): 40 mg via INTRAVENOUS
  Filled 2018-09-10 (×2): qty 40

## 2018-09-10 MED ORDER — ONDANSETRON 4 MG PO TBDP: 4.0000 mg | ORAL_TABLET | Freq: Four times a day (QID) | ORAL | Status: DC | PRN

## 2018-09-10 MED ORDER — ONDANSETRON HCL 4 MG/2ML IJ SOLN: 4.0000 mg | Freq: Four times a day (QID) | INTRAMUSCULAR | Status: DC | PRN

## 2018-09-10 MED ORDER — METOPROLOL TARTRATE 5 MG/5ML IV SOLN
5.0000 mg | Freq: Four times a day (QID) | INTRAVENOUS | Status: DC | PRN
  Administered 2018-09-10: 5 mg via INTRAVENOUS

## 2018-09-10 NOTE — H&P (Signed)
Surgical H&P  CC: bloating, emesis  HPI: This is a very nice and relatively healthy 48 year old man who presented to [facility] yesterday evening with a 2-week history of abdominal bloating, early satiety, decreased caliber of stools and periumbilical pain.  He states that he did lose about 10 pounds.  He developed worsening pain, nausea and vomiting which prompted him to go to the emergency room.  Work-up included CT scan that demonstrates a small bowel obstruction with a transition point at the level of the umbilicus.  This is his third such experience, although in the past his symptoms have lasted for maybe 12 or 24 hours and then resolved spontaneously.  In this case the 2-week lead-up was unusual.  He has had a laparoscopic bilateral inguinal hernia repair and umbilical hernia repair by Dr. Ninfa Linden in 2016.  He states that the 2 other obstructive events he had actually preceded his hernia surgery.  Allergies  Allergen Reactions  . Ibuprofen Other (See Comments)    Per patient feels like KNOT in throat and itching with rash  . Tramadol Anxiety    Past Medical History:  Diagnosis Date  . ADD (attention deficit disorder)   . Allergy   . Anxiety state, unspecified 03/10/2013  . Diverticulitis   . Insomnia   . Long-term current use of testosterone replacement therapy   . Sleep apnea     Past Surgical History:  Procedure Laterality Date  . INGUINAL HERNIA REPAIR Bilateral 01/22/2015   Procedure: LAPAROSCOPIC BILATERAL INGUINAL HERNIA REPAIR WITH MESH AND UMBILICAL HERNIA REPAIR;  Surgeon: [name], MD;  Location: [facility];  Service: General;  Laterality: Bilateral;  . QUADRICEPS TENDON REPAIR  08/2008  . UMBILICAL HERNIA REPAIR N/A 01/22/2015   Procedure: UMBILICAL HERNIA REPAIR;  Surgeon: [name], MD;  Location: [facility];  Service: General;  Laterality: N/A;    Family History  Problem Relation Age of Onset  .  Hypertension Father   . Colon polyps Father   . Cancer Maternal Grandmother 18       breast cancer  . Hypertension Paternal Grandmother   . COPD Paternal Grandfather        lung cancer; tobacco use, Arts administrator  . Cancer Maternal Aunt        breast cancer, late 61's  . Breast cancer Maternal Aunt   . Cancer Maternal Aunt        breast cancer; diagnosed late 105's  . Breast cancer Maternal Aunt   . Colon cancer Neg Hx   . Rectal cancer Neg Hx   . Stomach cancer Neg Hx   . Esophageal cancer Neg Hx     Social History   Socioeconomic History  . Marital status: Married    Spouse name: Herbie Saxon  . Number of children: 0  . Years of education: 12+  . Highest education level: Not on file  Occupational History  . Occupation: truck Education administrator: SELF-EMPLOYED  . Occupation: Museum/gallery curator  . Financial resource strain: Not on file  . Food insecurity    Worry: Not on file    Inability: Not on file  . Transportation needs    Medical: Not on file    Non-medical: Not on file  Tobacco Use  . Smoking status: Never Smoker  . Smokeless tobacco: Never Used  Substance and Sexual Activity  . Alcohol use: Yes    Comment: rare   . Drug use: No  .  Sexual activity: Yes    Partners: Female    Birth control/protection: None  Lifestyle  . Physical activity    Days per week: Not on file    Minutes per session: Not on file  . Stress: Not on file  Relationships  . Social Musicianconnections    Talks on phone: Not on file    Gets together: Not on file    Attends religious service: Not on file    Active member of club or organization: Not on file    Attends meetings of clubs or organizations: Not on file    Relationship status: Not on file  Other Topics Concern  . Not on file  Social History Narrative   Lives with wife, Tristan SchroederLien   Caffeine 1 cup avg twice weekly    No current facility-administered medications on file prior to encounter.    Current Outpatient Medications on  File Prior to Encounter  Medication Sig Dispense Refill  . amphetamine-dextroamphetamine (ADDERALL XR) 20 MG 24 hr capsule Take 1 capsule (20 mg total) by mouth every morning. 30 capsule 0  . doxazosin (CARDURA) 1 MG tablet Take 1 tablet (1 mg total) by mouth daily for 14 days, THEN 2 tablets (2 mg total) daily for 14 days. 42 tablet 0  . escitalopram (LEXAPRO) 10 MG tablet Take 1 tablet (10 mg total) by mouth daily. (Patient taking differently: Take 5 mg by mouth at bedtime. ) 30 tablet 2  . fluticasone (FLONASE) 50 MCG/ACT nasal spray Place 1 spray into both nostrils every evening.     . montelukast (SINGULAIR) 10 MG tablet Take 10 mg by mouth daily after breakfast.     . naproxen sodium (ALEVE) 220 MG tablet Take 440 mg by mouth 2 (two) times daily as needed (pain).     Marland Kitchen. amphetamine-dextroamphetamine (ADDERALL XR) 20 MG 24 hr capsule Take 1 capsule (20 mg total) by mouth every morning. Ok to fill 30 days after signature 30 capsule 0  . amphetamine-dextroamphetamine (ADDERALL XR) 20 MG 24 hr capsule Take 1 capsule (20 mg total) by mouth daily. Ok to fill 60 days after signature 30 capsule 0    Review of Systems: a complete, 10pt review of systems was completed with pertinent positives and negatives as documented in the HPI  Physical Exam: Vitals:   09/10/18 0136 09/10/18 0245  BP: (!) 145/95 (!) 142/92  Pulse: 98 88  Resp: 18 18  Temp: 98 F (36.7 C) 97.9 F (36.6 C)  SpO2: 99% 99%   Gen: A&Ox3, no distress  Head: normocephalic, atraumatic Eyes: extraocular motions intact, anicteric.  Neck: supple without mass or thyromegaly Chest: unlabored respirations, symmetrical air entry, clear bilaterally   Cardiovascular: RRR with palpable distal pulses, no pedal edema Abdomen: soft, minimally distended, very mildly subjectively tender at the umbilicus. No mass or organomegaly.  NG tube is in place with a moderate amount of thick tan drainage Extremities: warm, without edema, no deformities   Neuro: grossly intact Psych: appropriate mood and affect, normal insight  Skin: warm and dry   CBC Latest Ref Rng & Units 09/10/2018 09/09/2018 11/17/2016  WBC 4.0 - 10.5 K/uL 12.8(H) 14.1(H) 8.1  Hemoglobin 13.0 - 17.0 g/dL 17.8(H) 19.2(H) 16.3  Hematocrit 39.0 - 52.0 % 56.3(H) 59.2(H) 51.0  Platelets 150 - 400 K/uL 259 264 397(H)    CMP Latest Ref Rng & Units 09/10/2018 09/09/2018 04/25/2018  Glucose 70 - 99 mg/dL 161(W109(H) 960(A109(H) 81  BUN 6 - 20 mg/dL 12 14 21  Creatinine 0.61 - 1.24 mg/dL 7.84(O1.28(H) 9.621.18 9.521.24  Sodium 135 - 145 mmol/L 139 136 141  Potassium 3.5 - 5.1 mmol/L 3.9 4.0 4.9  Chloride 98 - 111 mmol/L 102 100 100  CO2 22 - 32 mmol/L 29 25 22   Calcium 8.9 - 10.3 mg/dL 9.1 9.5 84.110.1  Total Protein 6.5 - 8.1 g/dL - 7.1 7.2  Total Bilirubin 0.3 - 1.2 mg/dL - 1.3(H) 0.3  Alkaline Phos 38 - 126 U/L - 45 46  AST 15 - 41 U/L - 35 46(H)  ALT 0 - 44 U/L - 57(H) 72(H)    No results found for: INR, PROTIME  Imaging: Ct Abdomen Pelvis W Contrast  Result Date: 09/09/2018 CLINICAL DATA:  Abdominal distension. EXAM: CT ABDOMEN AND PELVIS WITH CONTRAST TECHNIQUE: Multidetector CT imaging of the abdomen and pelvis was performed using the standard protocol following bolus administration of intravenous contrast. CONTRAST:  100mL OMNIPAQUE IOHEXOL 300 MG/ML  SOLN COMPARISON:  CT dated October 15, 2014. FINDINGS: Lower chest: There is mild atelectasis at the left lung base.The heart size is normal. Hepatobiliary: The liver is normal. Normal gallbladder.There is no biliary ductal dilation. Pancreas: Normal contours without ductal dilatation. No peripancreatic fluid collection. Spleen: No splenic laceration or hematoma. Adrenals/Urinary Tract: --Adrenal glands: No adrenal hemorrhage. --Right kidney/ureter: No hydronephrosis or perinephric hematoma. --Left kidney/ureter: No hydronephrosis or perinephric hematoma. --Urinary bladder: Unremarkable. Stomach/Bowel: --Stomach/Duodenum: The stomach is moderately  distended. --Small bowel: There is a moderate to high-grade small bowel obstruction with an abrupt transition point in the midline anterior abdomen at the level of the umbilicus (axial series 2, image 56). There is no pneumatosis or free air. --Colon: Rectosigmoid diverticulosis without acute inflammation. --Appendix: Normal. Vascular/Lymphatic: Normal course and caliber of the major abdominal vessels. --No retroperitoneal lymphadenopathy. --No mesenteric lymphadenopathy. --No pelvic or inguinal lymphadenopathy. Reproductive: The prostate gland is enlarged. Other: There is a small volume of ascites. There is no free air. There are bilateral fat containing inguinal hernias. There is a small amount of fluid in the right inguinal hernia. There may be a right-sided varicocele. Musculoskeletal. No acute displaced fractures. IMPRESSION: 1. Moderate to high-grade small bowel obstruction with a distinct transition point at the level of the anterior abdomen immediately deep to the umbilicus. This may represent a he would is from the patient's prior umbilical hernia repair. 2. Small amount of free fluid in the abdomen, presumably reactive. No free air. No pneumatosis. 3. Distended stomach. 4. Sigmoid diverticulosis without CT evidence of diverticulitis. 5. Normal appendix. Electronically Signed   By: Katherine Mantlehristopher  Green M.D.   On: 09/09/2018 23:34   Dg Abd Portable 1v-small Bowel Protocol-position Verification  Result Date: 09/10/2018 CLINICAL DATA:  48 y/o  M; NG tube placement. EXAM: PORTABLE ABDOMEN - 1 VIEW COMPARISON:  09/10/2018 abdomen radiographs. 09/09/2018 CT abdomen and pelvis. FINDINGS: Enteric tube tip is stable and projects over the gastric body. Dilated loops of small bowel in the abdomen as seen on CT. Bones are unremarkable. Retained contrast within the bladder. IMPRESSION: Enteric tube tip is stable and projects over the gastric body. Electronically Signed   By: Mitzi HansenLance  Furusawa-Stratton M.D.   On:  09/10/2018 03:36   Dg Abd Portable 1 View  Result Date: 09/10/2018 CLINICAL DATA:  48 y/o  M; NG tube placement. EXAM: PORTABLE ABDOMEN - 1 VIEW COMPARISON:  09/09/2018 CT abdomen and pelvis FINDINGS: Enteric tube tip projects over the gastric body. Dilated loops of small bowel in the upper abdomen as seen on CT.  Bones are unremarkable. IMPRESSION: Enteric tube tip projects over the gastric body. Electronically Signed   By: Mitzi HansenLance  Furusawa-Stratton M.D.   On: 09/10/2018 01:7333    A/P: 48 year old gentleman with recurrent small bowel obstruction.  Symptoms are improved so far with the NG tube in place.  Leukocytosis of 14 on initial presentation likely reflects some degree of hemoconcentration, slightly improved this morning.  Creatinine appears to be at baseline, no acidosis.  Small bowel obstruction protocol has been initiated.  Continue supportive care, fluid resuscitation.  I discussed with him the nature of the small bowel protocol, and the possibility that he may require surgical intervention either during this admission or at some point in the future for recurrent obstructions in the same location.   Phylliss Blakeshelsea Rahmah Mccamy, MD [facility], [place] Pager [phone]

## 2018-09-10 NOTE — ED Notes (Signed)
Report called to [employer], Therapist, sports at [facility]

## 2018-09-10 NOTE — ED Notes (Signed)
Report called to Shanon Brow with [facility]

## 2018-09-10 NOTE — ED Notes (Signed)
Pt updated regarding plan of care at this time.

## 2018-09-10 NOTE — ED Provider Notes (Signed)
Patient signed out to me by Dr. Maryan Rued to follow-up on CT scan.  Patient with abdominal pain, abdominal distention.  CT scan shows moderate to high-grade obstruction with a transition point in the area of previous umbilical hernia repair.  I did go back and reevaluate the patient.  I do not feel any recurrence of hernia or masses to suggest a reducible incarcerated hernia.  NG tube placed.  Discussed with Dr. Kae Heller, on-call for general surgery.  Patient will be transferred to [facility] Curd will be admitted by the surgery service.   Orpah Greek, MD 09/10/18 [phone]

## 2018-09-11 ENCOUNTER — Inpatient Hospital Stay (HOSPITAL_COMMUNITY): Payer: BC Managed Care – PPO

## 2018-09-11 LAB — CBC
HCT: 54.6 % — ABNORMAL HIGH (ref 39.0–52.0)
Hemoglobin: 17.4 g/dL — ABNORMAL HIGH (ref 13.0–17.0)
MCH: 28.7 pg (ref 26.0–34.0)
MCHC: 31.9 g/dL (ref 30.0–36.0)
MCV: 90.1 fL (ref 80.0–100.0)
Platelets: 220 10*3/uL (ref 150–400)
RBC: 6.06 MIL/uL — ABNORMAL HIGH (ref 4.22–5.81)
RDW: 14.4 % (ref 11.5–15.5)
WBC: 10.6 10*3/uL — ABNORMAL HIGH (ref 4.0–10.5)
nRBC: 0 % (ref 0.0–0.2)

## 2018-09-11 LAB — BASIC METABOLIC PANEL
Anion gap: 9 (ref 5–15)
BUN: 10 mg/dL (ref 6–20)
CO2: 25 mmol/L (ref 22–32)
Calcium: 8.6 mg/dL — ABNORMAL LOW (ref 8.9–10.3)
Chloride: 104 mmol/L (ref 98–111)
Creatinine, Ser: 1.07 mg/dL (ref 0.61–1.24)
GFR calc Af Amer: >60 mL/min (ref >60)
GFR calc non Af Amer: >60 mL/min (ref >60)
Glucose, Bld: 109 mg/dL — ABNORMAL HIGH (ref 70–99)
Potassium: 3.7 mmol/L (ref 3.5–5.1)
Sodium: 138 mmol/L (ref 135–145)

## 2018-09-11 MED ORDER — DOCUSATE SODIUM 100 MG PO CAPS
100.0000 mg | ORAL_CAPSULE | Freq: Every day | ORAL | Status: DC
  Administered 2018-09-11: 100 mg via ORAL
  Filled 2018-09-11 (×2): qty 1

## 2018-09-11 MED ORDER — DOXAZOSIN MESYLATE 2 MG PO TABS
2.0000 mg | ORAL_TABLET | Freq: Every day | ORAL | Status: DC
  Administered 2018-09-11: 2 mg via ORAL
  Filled 2018-09-11: qty 1

## 2018-09-11 MED ORDER — MONTELUKAST SODIUM 10 MG PO TABS
10.0000 mg | ORAL_TABLET | Freq: Every day | ORAL | Status: DC
  Administered 2018-09-11: 10 mg via ORAL
  Filled 2018-09-11: qty 1

## 2018-09-11 MED ORDER — ESCITALOPRAM OXALATE 10 MG PO TABS
10.0000 mg | ORAL_TABLET | Freq: Every day | ORAL | Status: DC
  Filled 2018-09-11: qty 1

## 2018-09-11 MED ORDER — PANTOPRAZOLE SODIUM 40 MG PO TBEC: 40.0000 mg | DELAYED_RELEASE_TABLET | Freq: Two times a day (BID) | ORAL | Status: DC

## 2018-09-11 MED ORDER — AMPHETAMINE-DEXTROAMPHET ER 20 MG PO CP24
20.0000 mg | ORAL_CAPSULE | ORAL | Status: DC
  Administered 2018-09-11: 20 mg via ORAL
  Filled 2018-09-11: qty 1

## 2018-09-11 NOTE — Progress Notes (Signed)

## 2018-09-11 NOTE — Progress Notes (Signed)
[facility] Progress Note     Subjective: CC: wants to go home Patient got NGT out overnight as planned. Passing flatus and stool. Tolerating CLD. Denies nausea or vomiting. Denies abdominal pain or bloating.   Objective: Vital signs in last 24 hours: Temp:  [98.1 F (36.7 C)] 98.1 F (36.7 C) (06/30 0546) Pulse Rate:  [79-92] 79 (06/30 0546) Resp:  [18] 18 (06/30 0546) BP: (137-165)/(87-106) 139/87 (06/30 0546) SpO2:  [95 %-96 %] 96 % (06/30 0546) Last BM Date: 09/09/18  Intake/Output from previous day: 06/29 0701 - 06/30 0700 In: 4306.5 [P.O.:600; I.V.:3706.5] Out: 2575 [Urine:2075; Emesis/NG output:500] Intake/Output this shift: No intake/output data recorded.  PE: Gen:  Alert, NAD, pleasant Card:  Regular rate and rhythm Pulm:  Normal effort, clear to auscultation bilaterally Abd: Soft, non-tender, non-distended, +BS, scar tissue palpated at umbilicus Skin: warm and dry, no rashes  Psych: A&Ox3   Lab Results:  Recent Labs    09/10/18 0404 09/11/18 0401  WBC 12.8* 10.6*  HGB 17.8* 17.4*  HCT 56.3* 54.6*  PLT 259 220   BMET Recent Labs    09/10/18 0404 09/11/18 0401  NA 139 138  K 3.9 3.7  CL 102 104  CO2 29 25  GLUCOSE 109* 109*  BUN 12 10  CREATININE 1.28* 1.07  CALCIUM 9.1 8.6*   PT/INR No results for input(s): LABPROT, INR in the last 72 hours. CMP     Component Value Date/Time   NA 138 09/11/2018 0401   NA 141 04/25/2018 1656   K 3.7 09/11/2018 0401   CL 104 09/11/2018 0401   CO2 25 09/11/2018 0401   GLUCOSE 109 (H) 09/11/2018 0401   BUN 10 09/11/2018 0401   BUN 21 04/25/2018 1656   CREATININE 1.07 09/11/2018 0401   CREATININE 1.31 11/12/2014 1928   CALCIUM 8.6 (L) 09/11/2018 0401   PROT 7.1 09/09/2018 2229   PROT 7.2 04/25/2018 1656   ALBUMIN 4.4 09/09/2018 2229   ALBUMIN 4.7 04/25/2018 1656   AST 35 09/09/2018 2229   ALT 57 (H) 09/09/2018 2229   ALKPHOS 45 09/09/2018 2229   BILITOT 1.3 (H) 09/09/2018 2229   BILITOT 0.3 04/25/2018 1656   GFRNONAA >60 09/11/2018 0401   GFRAA >60 09/11/2018 0401   Lipase     Component Value Date/Time   LIPASE 33 09/09/2018 2229       Studies/Results: Ct Abdomen Pelvis W Contrast  Result Date: 09/09/2018 CLINICAL DATA:  Abdominal distension. EXAM: CT ABDOMEN AND PELVIS WITH CONTRAST TECHNIQUE: Multidetector CT imaging of the abdomen and pelvis was performed using the standard protocol following bolus administration of intravenous contrast. CONTRAST:  100mL OMNIPAQUE IOHEXOL 300 MG/ML  SOLN COMPARISON:  CT dated October 15, 2014. FINDINGS: Lower chest: There is mild atelectasis at the left lung base.The heart size is normal. Hepatobiliary: The liver is normal. Normal gallbladder.There is no biliary ductal dilation. Pancreas: Normal contours without ductal dilatation. No peripancreatic fluid collection. Spleen: No splenic laceration or hematoma. Adrenals/Urinary Tract: --Adrenal glands: No adrenal hemorrhage. --Right kidney/ureter: No hydronephrosis or perinephric hematoma. --Left kidney/ureter: No hydronephrosis or perinephric hematoma. --Urinary bladder: Unremarkable. Stomach/Bowel: --Stomach/Duodenum: The stomach is moderately distended. --Small bowel: There is a moderate to high-grade small bowel obstruction with an abrupt transition point in the midline anterior abdomen at the level of the umbilicus (axial series 2, image 56). There is no pneumatosis or free air. --Colon: Rectosigmoid diverticulosis without acute inflammation. --Appendix: Normal. Vascular/Lymphatic: Normal course and caliber of the major abdominal vessels. --  No retroperitoneal lymphadenopathy. --No mesenteric lymphadenopathy. --No pelvic or inguinal lymphadenopathy. Reproductive: The prostate gland is enlarged. Other: There is a small volume of ascites. There is no free air. There are bilateral fat containing inguinal hernias. There is a small amount of fluid in the right inguinal hernia. There may be a  right-sided varicocele. Musculoskeletal. No acute displaced fractures. IMPRESSION: 1. Moderate to high-grade small bowel obstruction with a distinct transition point at the level of the anterior abdomen immediately deep to the umbilicus. This may represent a he would is from the patient's prior umbilical hernia repair. 2. Small amount of free fluid in the abdomen, presumably reactive. No free air. No pneumatosis. 3. Distended stomach. 4. Sigmoid diverticulosis without CT evidence of diverticulitis. 5. Normal appendix. Electronically Signed   By: Constance Holster M.D.   On: 09/09/2018 23:34   Dg Abd Portable 1v-small Bowel Obstruction Protocol-24 Hr Delay  Result Date: 09/11/2018 CLINICAL DATA:  24 hour delay.  Small bowel obstruction EXAM: PORTABLE ABDOMEN - 1 VIEW COMPARISON:  Yesterday FINDINGS: Oral contrast has continued to progress through the nondilated colon. There is still dilated loop of small bowel in the left abdomen. No concerning mass effect or gas collection. IMPRESSION: 1. Partial small bowel obstruction with continued progression contrast into the nondilated left colon. 2. Single loop of dilated small bowel. Electronically Signed   By: Monte Fantasia M.D.   On: 09/11/2018 04:55   Dg Abd Portable 1v-small Bowel Obstruction Protocol-initial, 8 Hr Delay  Result Date: 09/10/2018 CLINICAL DATA:  Small-bowel obstruction on CT. 8 hour delayed abdominal radiograph post enteric contrast administration per NG. EXAM: PORTABLE ABDOMEN - 1 VIEW COMPARISON:  CT 09/01/2018.  Radiographs earlier today. FINDINGS: 1137 hours. Two views are submitted. Tip of the enteric tube projects over the mid stomach. Contrast material is present in the distal small bowel and proximal colon. No dilated loops of bowel are identified. There is no evidence of contrast extravasation. IMPRESSION: The administered contrast has passed into the colon, and no significant bowel dilatation identified. No evidence of bowel  obstruction. Electronically Signed   By: Richardean Sale M.D.   On: 09/10/2018 12:04   Dg Abd Portable 1v-small Bowel Protocol-position Verification  Result Date: 09/10/2018 CLINICAL DATA:  48 y/o  M; NG tube placement. EXAM: PORTABLE ABDOMEN - 1 VIEW COMPARISON:  09/10/2018 abdomen radiographs. 09/09/2018 CT abdomen and pelvis. FINDINGS: Enteric tube tip is stable and projects over the gastric body. Dilated loops of small bowel in the abdomen as seen on CT. Bones are unremarkable. Retained contrast within the bladder. IMPRESSION: Enteric tube tip is stable and projects over the gastric body. Electronically Signed   By: Kristine Garbe M.D.   On: 09/10/2018 03:36   Dg Abd Portable 1 View  Result Date: 09/10/2018 CLINICAL DATA:  48 y/o  M; NG tube placement. EXAM: PORTABLE ABDOMEN - 1 VIEW COMPARISON:  09/09/2018 CT abdomen and pelvis FINDINGS: Enteric tube tip projects over the gastric body. Dilated loops of small bowel in the upper abdomen as seen on CT. Bones are unremarkable. IMPRESSION: Enteric tube tip projects over the gastric body. Electronically Signed   By: Kristine Garbe M.D.   On: 09/10/2018 01:33    Anti-infectives: Anti-infectives (From admission, onward)   None       Assessment/Plan Sleep apnea Hx of inguinal hernia repair Diverticulosis Anxiety ADHD  SBO - 8h delay film with contrast in colon, 24h delay film with contrast in L colon and a single dilated loop  of bowel  - tolerating CLD and having BMs - advance to soft diet - discussed mobilizing and adding colace prn to home bowel regimen - discussed that if these partial obstructions continue to occur he may benefit from diagnostic laparoscopy at some point - possible discharge home this afternoon if tolerating soft diet  FEN: soft, SLIV VTE: SCDs, lovenox ID: no abx indicated   LOS: 1 day    Wells GuilesKelly Rayburn , [facility] 09/11/2018, 7:31 AM Pager: [phone] Consults:  [phone]

## 2018-09-11 NOTE — Discharge Instructions (Signed)
Bowel Obstruction °A bowel obstruction means that something is blocking the small or large bowel. The bowel is also called the intestine. It is the long tube that connects the stomach to the opening of the butt (anus). When something blocks the bowel, food and fluids cannot pass through like normal. This condition needs to be treated. Treatment depends on the cause of the problem and how bad the problem is. °What are the causes? °Common causes of this condition include: °· Scar tissue (adhesions) from past surgery or from high-energy X-rays (radiation). °· Recent surgery in the belly. This affects how food moves in the bowel. °· Some diseases, such as: °? Irritation of the lining of the digestive tract (Crohn's disease). °? Irritation of small pouches in the bowel (diverticulitis). °· Growths or tumors. °· A bulging organ (hernia). °· Twisting of the bowel (volvulus). °· A foreign body. °· Slipping of a part of the bowel into another part (intussusception). °What are the signs or symptoms? °Symptoms of this condition include: °· Pain in the belly. °· Feeling sick to your stomach (nauseous). °· Throwing up (vomiting). °· Bloating in the belly. °· Being unable to pass gas. °· Trouble pooping (constipation). °· Watery poop (diarrhea). °· A lot of belching. °How is this diagnosed? °This condition may be diagnosed based on: °· A physical exam. °· Medical history. °· Imaging tests, such as X-ray or CT scan. °· Blood tests. °· Urine tests. °How is this treated? °Treatment for this condition may include: °· Fluids and pain medicines that are given through an IV tube. Your doctor may tell you not to eat or drink if you feel sick to your stomach and are throwing up. °· Eating a clear liquid diet for a few days. °· Putting a small tube (nasogastric tube) into the stomach. This will help with pain, discomfort, and nausea by removing blocked air and fluids from the stomach. °· Surgery. This may be needed if other treatments do  not work. °Follow these instructions at home: °Medicines °· Take over-the-counter and prescription medicines only as told by your doctor. °· If you were prescribed an antibiotic medicine, take it as told by your doctor. Do not stop taking the antibiotic even if you start to feel better. °General instructions °· Follow your diet as told by your doctor. You may need to: °? Only drink clear liquids until you start to get better. °? Avoid solid foods. °· Return to your normal activities as told by your doctor. Ask your doctor what activities are safe for you. °· Do not sit for a long time without moving. Get up to take short walks every 1-2 hours. This is important. Ask for help if you feel weak or unsteady. °· Keep all follow-up visits as told by your doctor. This is important. °How is this prevented? °After having a bowel obstruction, you may be more likely to have another. You can do some things to stop it from happening again. °· If you have a long-term (chronic) disease, contact your doctor if you see changes or problems. °· Take steps to prevent or treat trouble pooping. Your doctor may ask that you: °? Drink enough fluid to keep your pee (urine) pale yellow. °? Take over-the-counter or prescription medicines. °? Eat foods that are high in fiber. These include beans, whole grains, and fresh fruits and vegetables. °? Limit foods that are high in fat and sugar. These include fried or sweet foods. °· Stay active. Ask your doctor which exercises are   safe for you. °· Avoid stress. °· Eat three small meals and three small snacks each day. °· Work with a food expert (dietitian) to make a meal plan that works for you. °· Do not use any products that contain nicotine or tobacco, such as cigarettes and e-cigarettes. If you need help quitting, ask your doctor. °Contact a doctor if: °· You have a fever. °· You have chills. °Get help right away if: °· You have pain or cramps that get worse. °· You throw up blood. °· You are  sick to your stomach. °· You cannot stop throwing up. °· You cannot drink fluids. °· You feel mixed up (confused). °· You feel very thirsty (dehydrated). °· Your belly gets more bloated. °· You feel weak or you pass out (faint). °Summary °· A bowel obstruction means that something is blocking the small or large bowel. °· Treatment may include IV fluids and pain medicine. You may also have a clear liquid diet, a small tube in your stomach, or surgery. °· Drink clear liquids and avoid solid foods until you get better. °This information is not intended to replace advice given to you by your health care provider. Make sure you discuss any questions you have with your health care provider. °Document Released: 04/07/2004 Document Revised: 07/12/2017 Document Reviewed: 07/12/2017 °Elsevier Patient Education © 2020 [employer] ° ° °Full Liquid Diet °A full liquid diet refers to fluids and foods that are liquid or will become liquid at room temperature. This diet should only be used for a short period of time to help you recover from illness or surgery. Your health care provider or dietitian will help you determine when it is safe to eat regular foods. °What are tips for following this plan? ° °  ° °Reading food labels °· Check food labels of nutrition shakes for the amount of protein. Look for nutrition shakes that have at least 8-10 grams of protein in each serving. °· Look for drinks, such as milks and juices, that are "fortified" or "enriched." This means that vitamins and minerals have been added. °Shopping °· Buy premade nutritional shakes to keep on hand. °· To vary your choices, buy different flavors of milks and shakes. °Meal planning °· Choose flavors and foods that you enjoy. °· To make sure you get enough energy from food (calories): °? Eat 3 full liquid meals each day. Have a liquid snack between each meal. °? Drink 6-8 ounces (177-237 ml) of a nutrition supplement shake with meals or as snacks. °? Add protein  powder, powdered milk, milk, or yogurt to shakes to increase the amount of protein. °· Drink at least one serving a day of citrus fruit juice or fruit juice that has vitamin C added. °General guidelines °· Before starting the full-liquid diet, check with your health care provider to know what foods you should avoid. These may include full-fat or high-fiber liquids. °· You may have any liquid or food that becomes a liquid at room temperature. The food is considered a liquid if it can be poured off a spoon at room temperature. °· Do not drink alcohol unless approved by your health care provider. °· This diet gives you most of the nutrients that you need for energy, but you may not get enough of certain vitamins, minerals, and fiber. Make sure to talk to your health care provider or dietitian about: °? How many calories you need to eat get day. °? How much fluid you should have each day. °?   Taking a multivitamin or a nutritional supplement. °What foods are allowed? °The items listed may not be a complete list. Talk with your dietitian about what dietary choices are best for you. °Grains °Thin hot cereal, such as cream of wheat. Soft-cooked pasta or rice puréed in soup. °Vegetables °Pulp-free tomato or vegetable juice. Vegetables puréed in soup. °Fruits °Fruit juice without pulp. Strained fruit purées (seeds and skins removed). °Meats and other protein foods °Beef, chicken, and fish broths. Powdered protein supplements. °Dairy °Milk and milk-based beverages, including milk shakes and instant breakfast mixes. Smooth yogurt. Pureéd cottage cheese. °Beverages °Water. Coffee and tea (caffeinated or decaffeinated). Cocoa. Liquid nutritional supplements. Soft drinks. Nondairy milks, such as almond, coconut, rice, or soy milk. °Fats and oils °Melted margarine and butter. Cream. Canola, almond, avocado, corn, grapeseed, sunflower, and sesame oils. Gravy. °Sweets and desserts °Custard. Pudding. Flavored gelatin. Smooth ice cream  (without nuts or candy pieces). Sherbet. Popsicles. Italian ice. Pudding pops. °Seasoning and other foods °Salt and pepper. Spices. Cocoa powder. Vinegar. Ketchup. Yellow mustard. Smooth sauces, such as Hollandaise, cheese sauce, or white sauce. Soy sauce. Cream soups. Strained soups. Syrup. Honey. Jelly (without fruit pieces). °What foods are not allowed? °The items listed may not be a complete list. Talk with your dietitian about what dietary choices are best for you. °Grains °Whole grains. Pasta. Rice. Cold cereal. Bread. Crackers. °Vegetables °All whole fresh, frozen, or canned vegetables. °Fruits °All whole fresh, frozen, or canned fruits. °Meats and other protein foods °All cuts of meat, poultry, and fish. Eggs. Tofu and soy protein. Nuts and nut butters. Lunch meat. Sausage. °Dairy °Hard cheese. Yogurt with fruit chunks. °Fats and oils °Coconut oil. Palm oil. Lard. Cold butter. °Sweets and desserts °Ice cream or other frozen desserts that have any solids in them or on top, such as nuts, chocolate chips, and pieces of cookies. Cakes. Cookies. Candy. °Seasoning and other foods °Stone-ground mustards. Soups with chunks or pieces. °Summary °· A full liquid diet refers to fluids and foods that are liquid or will become liquid at room temperature. °· This diet should only be used for a short period of time to help you recover from illness or surgery. Ask your health care provider or dietitian when it is safe for you to eat regular foods. °· To make sure you get enough calories and nutrients, eat 3 meals each day with snacks between. Drink premade nutrition supplement shakes or add protein powder to homemade shakes. Take a vitamin and mineral supplement as told by your health care provider. °This information is not intended to replace advice given to you by your health care provider. Make sure you discuss any questions you have with your health care provider. °Document Released: 02/28/2005 Document Revised:  05/27/2017 Document Reviewed: 04/13/2016 °Elsevier Patient Education © 2020 Elsevier Inc. ° ° °Soft-Food Eating Plan °A soft-food eating plan includes foods that are safe and easy to chew and swallow. Your health care provider or dietitian can help you find foods and flavors that fit into this plan. Follow this plan until your health care provider or dietitian says it is safe to start eating other foods and food textures. °What are tips for following this plan? °General guidelines ° °· Take small bites of food, or cut food into pieces about ½ inch or smaller. Bite-sized pieces of food are easier to chew and swallow. °· Eat moist foods. Avoid overly dry foods. °· Avoid foods that: °? Are difficult to swallow, such as dry, chunky, crispy, or   sticky foods. ? Are difficult to chew, such as hard, tough, or stringy foods. ? Contain nuts, seeds, or fruits.  Follow instructions from your dietitian about the types of liquids that are safe for you to swallow. You may be allowed to have: ? Thick liquids only. This includes only liquids that are thicker than honey. ? Thin and thick liquids. This includes all beverages and foods that become liquid at room temperature.  To make thick liquids: ? Purchase a commercial liquid thickening powder. These are available at grocery stores and pharmacies. ? Mix the thickener into liquids according to instructions on the label. ? Purchase ready-made thickened liquids. ? Thicken soup by pureeing, straining to remove chunks, and adding flour, potato flakes, or corn starch. ? Add commercial thickener to foods that become liquid at room temperature, such as milk shakes, yogurt, ice cream, gelatin, and sherbet.  Ask your health care provider whether you need to take a fiber supplement. Cooking  Cook meats so they stay tender and moist. Use methods like braising, stewing, or baking in liquid.  Cook vegetables and fruit until they are soft enough to be mashed with a  fork.  Peel soft, fresh fruits such as peaches, nectarines, and melons.  When making soup, make sure chunks of meat and vegetables are smaller than  inch.  Reheat leftover foods slowly so that a tough crust does not form. What foods are allowed? The items listed below may not be a complete list. Talk with your dietitian about what dietary choices are best for you. Grains Breads, muffins, pancakes, or waffles moistened with syrup, jelly, or butter. Dry cereals well-moistened with milk. Moist, cooked cereals. Well-cooked pasta and rice. Vegetables All soft-cooked vegetables. Shredded lettuce. Fruits All canned and cooked fruits. Soft, peeled fresh fruits. Strawberries. Dairy Milk. Cream. Yogurt. Cottage cheese. Soft cheese without the rind. Meats and other protein foods Tender, moist ground meat, poultry, or fish. Meat cooked in gravy or sauces. Eggs. Sweets and desserts Ice cream. Milk shakes. Sherbet. Pudding. Fats and oils Butter. Margarine. Olive, canola, sunflower, and grapeseed oil. Smooth salad dressing. Smooth cream cheese. Mayonnaise. Gravy. What foods are not allowed? The items listed bemay not be a complete list. Talk with your dietitian about what dietary choices are best for you. Grains Coarse or dry cereals, such as bran, granola, and shredded wheat. Tough or chewy crusty breads, such as [place] bread or baguettes. Breads with nuts, seeds, or fruit. Vegetables All raw vegetables. Cooked corn. Cooked vegetables that are tough or stringy. Tough, crisp, fried potatoes and potato skins. Fruits Fresh fruits with skins or seeds, or both, such as apples, pears, and grapes. Stringy, high-pulp fruits, such as papaya, pineapple, coconut, and mango. Fruit leather and all dried fruit. Dairy Yogurt with nuts or coconut. Meats and other protein foods Hard, dry sausages. Dry meat, poultry, or fish. Meats with gristle. Fish with bones. Fried meat or fish. Lunch meat and hotdogs. Nuts  and seeds. Chunky peanut butter or other nut butters. Sweets and desserts Cakes or cookies that are very dry or chewy. Desserts with dried fruit, nuts, or coconut. Fried pastries. Very rich pastries. Fats and oils Cream cheese with fruit or nuts. Salad dressings with seeds or chunks. Summary  A soft-food eating plan includes foods that are safe and easy to swallow. Generally, the foods should be soft enough to be mashed with a fork.  Avoid foods that are dry, hard to chew, crunchy, sticky, stringy, or crispy.  Ask your health  care provider whether you need to thicken your liquids and if you need to take a fiber supplement. °This information is not intended to replace advice given to you by your health care provider. Make sure you discuss any questions you have with your health care provider. °Document Released: 06/07/2007 Document Revised: 06/21/2018 Document Reviewed: 05/03/2016 °Elsevier Patient Education © 2020 Elsevier Inc. ° ° °

## 2018-09-11 NOTE — Progress Notes (Signed)
Pt alert, oriented, tolerating diet. D/C instructions given and pt was d/cd home.

## 2018-09-11 NOTE — Plan of Care (Signed)
All goals met for d/c.

## 2018-09-11 NOTE — Discharge Summary (Signed)
Physician Discharge Summary  Patient ID: Jose Shaffer MRN: [id number] DOB/AGE: January 06, 1971 48 y.o.  Admit date: 09/09/2018 Discharge date: 09/11/2018  Admission Diagnoses:  Recurrent small bowel obstruction History of inguinal hernia repairs 01/22/2015 Dr. [name] Hx umbilical hernia repair Hx ADD Hx diverticulitis Hx insomnia Hx sleep apnea  Discharge Diagnoses:  Same   Active Problems:   SBO (small bowel obstruction) (HCC)   Small bowel obstruction (HCC)   PROCEDURES: None  Hospital Course:  This is a very nice and relatively healthy 48 year old man who presented [facility] evening with a 2-week history of abdominal bloating, early satiety, decreased caliber of stools and periumbilical pain.  He states that he did lose about 10 pounds.  He developed worsening pain, nausea and vomiting which prompted him to go to the emergency room.  Work-up included CT scan that demonstrates a small bowel obstruction with a transition point at the level of the umbilicus.  This is his third such experience, although in the past his symptoms have lasted for maybe 12 or 24 hours and then resolved spontaneously.  In this case the 2-week lead-up was unusual.  He has had a laparoscopic bilateral inguinal hernia repair and umbilical hernia repair by Dr. [name] in 2016.  He states that the 2 other obstructive events he had actually preceded his hernia surgery.  Patient was admitted by Dr. Doylene Canard and placed on small bowel protocol.  8-hour delay film showed contrast into the colon with no significant bowel dilatation.  He was started on clear liquids his diet was advanced over the next 24 hours.  He had resolution of his bloating.  Nausea vomiting resolved.  Bowel function returned and he was having bowel movements.  He was subsequently discharged home with instructions to follow-up for possible diagnostic laparoscopy at a future time for his recurrent small bowel  obstructions.  Condition on discharge: Improved  CBC Latest Ref Rng & Units 09/11/2018 09/10/2018 09/09/2018  WBC 4.0 - 10.5 K/uL 10.6(H) 12.8(H) 14.1(H)  Hemoglobin 13.0 - 17.0 g/dL 17.4(H) 17.8(H) 19.2(H)  Hematocrit 39.0 - 52.0 % 54.6(H) 56.3(H) 59.2(H)  Platelets 150 - 400 K/uL 220 259 264   CMP Latest Ref Rng & Units 09/11/2018 09/10/2018 09/09/2018  Glucose 70 - 99 mg/dL 195(K) 932(I) 712(W)  BUN 6 - 20 mg/dL 10 12 14   Creatinine 0.61 - 1.24 mg/dL 5.80 9.98(P) 3.82  Sodium 135 - 145 mmol/L 138 139 136  Potassium 3.5 - 5.1 mmol/L 3.7 3.9 4.0  Chloride 98 - 111 mmol/L 104 102 100  CO2 22 - 32 mmol/L 25 29 25   Calcium 8.9 - 10.3 mg/dL 5.0(N) 9.1 9.5  Total Protein 6.5 - 8.1 g/dL - - 7.1  Total Bilirubin 0.3 - 1.2 mg/dL - - 1.3(H)  Alkaline Phos 38 - 126 U/L - - 45  AST 15 - 41 U/L - - 35  ALT 0 - 44 U/L - - 57(H)    CT scan 09/09/2018: 1. Moderate to high-grade small bowel obstruction with a distinct transition point at the level of the anterior abdomen immediately deep to the umbilicus. This may represent a he would is from the patient's prior umbilical hernia repair. 2. Small amount of free fluid in the abdomen, presumably reactive. No free air. No pneumatosis. 3. Distended stomach. 4. Sigmoid diverticulosis without CT evidence of diverticulitis. 5. Normal appendix.  Condition on discharge: Improved.  Disposition: Discharge disposition: 01-Home or Self Care        Allergies as of  09/11/2018      Reactions   Ibuprofen Other (See Comments)   Per patient feels like KNOT in throat and itching with rash   Tramadol Anxiety      Medication List    TAKE these medications   amphetamine-dextroamphetamine 20 MG 24 hr capsule Commonly known as: ADDERALL XR Take 1 capsule (20 mg total) by mouth every morning.   doxazosin 1 MG tablet Commonly known as: CARDURA Take 1 tablet (1 mg total) by mouth daily for 14 days, THEN 2 tablets (2 mg total) daily for 14 days. Start  taking on: August 21, 2018   escitalopram 10 MG tablet Commonly known as: LEXAPRO Take 1 tablet (10 mg total) by mouth daily. What changed:   how much to take  when to take this   fluticasone 50 MCG/ACT nasal spray Commonly known as: FLONASE Place 1 spray into both nostrils every evening.   montelukast 10 MG tablet Commonly known as: SINGULAIR Take 10 mg by mouth daily after breakfast.   naproxen sodium 220 MG tablet Commonly known as: ALEVE Take 440 mg by mouth 2 (two) times daily as needed (pain).      Follow-up Information    [facility]. Call.   Specialty: General Surgery Why: Call as needed to discuss diagnostic laparoscopy if continuing to have intermittent obstructive symptoms.  Contact information: [address] [place] [address] [phone]        [name], MD Follow up.   Specialty: Family Medicine Contact information: [address] [place] [address] [phone]           Signed: Sherrie George 09/11/2018, 3:25 PM

## 2018-09-18 ENCOUNTER — Telehealth: Payer: Self-pay | Admitting: Registered Nurse

## 2018-09-18 DIAGNOSIS — N401 Enlarged prostate with lower urinary tract symptoms: Secondary | ICD-10-CM

## 2018-09-24 NOTE — Telephone Encounter (Addendum)
°  Relation to pt: self Call back number: [phone]  Pharmacy: [facility], [place] AT [facility] [address] [phone] (Phone) [phone] (Fax)     Reason for call:  Patient in need of clarity regarding why doxazosin (CARDURA) 1 MG tablet was denied, stating  Maximiano Coss, NP prescribed on 08/21/2018 therefore is out ,patient requesting a new Rx, completely out, please advise patient today regarding the status

## 2018-09-25 ENCOUNTER — Other Ambulatory Visit: Payer: Self-pay | Admitting: Registered Nurse

## 2018-09-25 DIAGNOSIS — N401 Enlarged prostate with lower urinary tract symptoms: Secondary | ICD-10-CM

## 2018-09-25 MED ORDER — DOXAZOSIN MESYLATE 1 MG PO TABS: 2.0000 mg | ORAL_TABLET | Freq: Every day | ORAL | 1 refills | Status: DC

## 2018-09-25 NOTE — Progress Notes (Signed)
Refills of doxazosin sent  Kathrin Ruddy, NP

## 2018-09-25 NOTE — Telephone Encounter (Signed)
Rx was sent to pharmacy. 

## 2018-09-25 NOTE — Telephone Encounter (Signed)
Message was sent to provider regarding his Rx request.

## 2018-10-22 ENCOUNTER — Encounter: Payer: Self-pay | Admitting: Family Medicine

## 2018-10-22 ENCOUNTER — Other Ambulatory Visit: Payer: Self-pay

## 2018-10-22 ENCOUNTER — Ambulatory Visit (INDEPENDENT_AMBULATORY_CARE_PROVIDER_SITE_OTHER): Payer: BC Managed Care – PPO | Admitting: Family Medicine

## 2018-10-22 VITALS — BP 136/87 | HR 94 | Temp 98.8°F | Ht 70.0 in | Wt 249.0 lb

## 2018-10-22 DIAGNOSIS — N401 Enlarged prostate with lower urinary tract symptoms: Secondary | ICD-10-CM

## 2018-10-22 DIAGNOSIS — R748 Abnormal levels of other serum enzymes: Secondary | ICD-10-CM

## 2018-10-22 DIAGNOSIS — D751 Secondary polycythemia: Secondary | ICD-10-CM

## 2018-10-22 DIAGNOSIS — Z79899 Other long term (current) drug therapy: Secondary | ICD-10-CM | POA: Diagnosis not present

## 2018-10-22 DIAGNOSIS — F902 Attention-deficit hyperactivity disorder, combined type: Secondary | ICD-10-CM

## 2018-10-22 DIAGNOSIS — R351 Nocturia: Secondary | ICD-10-CM

## 2018-10-22 DIAGNOSIS — F411 Generalized anxiety disorder: Secondary | ICD-10-CM | POA: Diagnosis not present

## 2018-10-22 DIAGNOSIS — E291 Testicular hypofunction: Secondary | ICD-10-CM

## 2018-10-22 MED ORDER — MONTELUKAST SODIUM 10 MG PO TABS: 10.0000 mg | ORAL_TABLET | Freq: Every day | ORAL | 1 refills | Status: DC

## 2018-10-22 MED ORDER — DOXAZOSIN MESYLATE 1 MG PO TABS: 2.0000 mg | ORAL_TABLET | Freq: Every day | ORAL | 1 refills | Status: DC

## 2018-10-22 MED ORDER — AMPHETAMINE-DEXTROAMPHET ER 20 MG PO CP24: 20.0000 mg | ORAL_CAPSULE | Freq: Every day | ORAL | 0 refills | Status: DC

## 2018-10-22 MED ORDER — AMPHETAMINE-DEXTROAMPHET ER 20 MG PO CP24: 20.0000 mg | ORAL_CAPSULE | ORAL | 0 refills | Status: DC

## 2018-10-22 NOTE — Progress Notes (Signed)
8/10/20204:23 PM  Jose Shaffer 09-May-1970, 48 y.o., male [id number]  Chief Complaint  Patient presents with  . ADHD    HPI:   Patient is a 48 y.o. 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 2/9 10/22/2018 08/21/2018 06/21/2018  Decreased Interest 0 0 0  Down, Depressed, Hopeless 0 0 0  PHQ - 2 Score 0 0 0  Altered sleeping - - 3  Tired, decreased energy - - 0  Change in appetite - - 0  Feeling bad or failure about yourself  - - 0  Trouble concentrating - - 1  Moving slowly or fidgety/restless - - 0  Suicidal thoughts - - 0  PHQ-9 Score - - 4  Difficult doing work/chores - - Not difficult at all    Fall Risk  10/22/2018 08/21/2018 06/21/2018 05/22/2018 04/25/2018  Falls in the past year? 0 - 0 0 0  Number falls in past yr: 0 - 0 - 0  Injury with Fall? 0 0 0 - 0  Follow up - - Falls evaluation completed Falls evaluation completed -     Allergies  Allergen Reactions  . Ibuprofen Other (See Comments)    Per patient feels like KNOT in throat and itching with rash  . Tramadol Anxiety    Prior to Admission medications   Medication Sig Start Date End Date Taking? Authorizing Provider  amphetamine-dextroamphetamine (ADDERALL XR) 20 MG 24 hr capsule Take 1 capsule (20 mg total) by mouth every morning. 08/22/18  Yes [name], MD  doxazosin (CARDURA) 1 MG tablet Take 2 tablets (2 mg total) by mouth daily. 09/25/18  Yes Janeece Agee, NP  escitalopram (LEXAPRO) 10  MG tablet Take 1 tablet (10 mg total) by mouth daily. Patient taking differently: Take 5 mg by mouth at bedtime.  04/25/18  Yes [name], MD  fluticasone [facility]) 50 MCG/ACT nasal spray Place 1 spray into both nostrils every evening.    Yes [provider]  montelukast (SINGULAIR) 10 MG tablet Take 10 mg by mouth daily after breakfast.    Yes [provider]  naproxen sodium (ALEVE) 220 MG tablet Take 440 mg by mouth 2 (two) times daily as needed (pain).    Yes [provider]    Past Medical History:  Diagnosis Date  . ADD (attention deficit disorder)   . Allergy   . Anxiety state, unspecified 03/10/2013  . Diverticulitis   . Insomnia   . Long-term current use of testosterone replacement therapy   . Sleep apnea     Past Surgical History:  Procedure Laterality Date  . INGUINAL HERNIA REPAIR Bilateral 01/22/2015   Procedure: LAPAROSCOPIC BILATERAL INGUINAL HERNIA REPAIR WITH MESH AND UMBILICAL HERNIA REPAIR;  Surgeon: Abigail Miyamoto, MD;  Location: [facility];  Service: General;  Laterality: Bilateral;  . QUADRICEPS TENDON REPAIR  08/2008  . UMBILICAL HERNIA REPAIR N/A 01/22/2015   Procedure: UMBILICAL HERNIA REPAIR;  Surgeon: Abigail Miyamoto, MD;  Location: [facility];  Service: General;  Laterality: N/A;    Social History   Tobacco Use  . Smoking status: Never Smoker  . Smokeless tobacco: Never Used  Substance Use Topics  . Alcohol use: Yes    Comment: rare     Family History  Problem Relation Age of Onset  . Hypertension Father   . Colon polyps Father   . Cancer Maternal Grandmother 4       breast cancer  . Hypertension Paternal Grandmother   . COPD Paternal Grandfather        lung cancer; tobacco use, Doctor, general practice  . Cancer Maternal Aunt        breast cancer, late 25's  . Breast cancer Maternal Aunt   . Cancer Maternal Aunt        breast cancer; diagnosed late 83's  . Breast cancer Maternal  Aunt   . Colon cancer Neg Hx   . Rectal cancer Neg Hx   . Stomach cancer Neg Hx   . Esophageal cancer Neg Hx     Review of Systems  Constitutional: Negative for chills and fever.  Respiratory: Negative for cough and shortness of breath.   Cardiovascular: Negative for chest pain, palpitations and leg swelling.  Gastrointestinal: Negative for abdominal pain, nausea and vomiting.     OBJECTIVE:  Today's Vitals   10/22/18 1614  BP: 136/87  Pulse: 94  Temp: 98.8 F (37.1 C)  TempSrc: Oral  SpO2: 96%  Weight: 249 lb (112.9 kg)  Height: 5\' 10"  (1.778 m)   Body mass index is 35.73 kg/m.   Physical Exam Vitals signs and nursing note reviewed.  Constitutional:      Appearance: He is well-developed.  HENT:     Head: Normocephalic and atraumatic.  Eyes:     Conjunctiva/sclera: Conjunctivae normal.     Pupils: Pupils are equal, round, and reactive to light.  Neck:     Musculoskeletal: Neck supple.  Pulmonary:     Effort: Pulmonary effort is normal.  Skin:    General: Skin is warm and dry.  Neurological:     Mental Status: He is alert and oriented to person, place, and time.     ASSESSMENT and PLAN  1. BPH associated with nocturia Controlled. Continue current regime.  - doxazosin (CARDURA) 1 MG tablet; Take 2 tablets (2 mg total) by mouth daily.  2. Medication management - CBC - Comprehensive metabolic panel  3. Attention deficit hyperactivity disorder (ADHD), combined type Continues to have side effects despite changes in dose and formulations, referring to psychiatry - Ambulatory referral to Psychiatry  4. Generalized anxiety disorder Controlled. Continue current regime.   5. Abnormal liver enzymes - Comprehensive metabolic panel  6. Polycythemia, secondary - CBC  Other orders - amphetamine-dextroamphetamine (ADDERALL XR) 20 MG 24 hr capsule; Take 1 capsule (20 mg total) by mouth every morning. - montelukast (SINGULAIR) 10 MG tablet; Take 1 tablet (10  mg total) by mouth at bedtime. - amphetamine-dextroamphetamine (ADDERALL XR) 20 MG 24 hr capsule; Take 1 capsule (20 mg total) by mouth daily. Ok to fill 30 days after signature  Return in about 4 months (around 02/21/2019).    [name], MD Primary Care at [facility] [address] [place], [place] [address] Ph.  [phone] Fax [phone]

## 2018-10-22 NOTE — Patient Instructions (Signed)
° ° ° °  If you have lab work done today you will be contacted with your lab results within the next 2 weeks.  If you have not heard from us then please contact us. The fastest way to get your results is to register for My Chart. ° ° °IF you received an x-ray today, you will receive an invoice from [facility]. Please contact [facility] at [phone] with questions or concerns regarding your invoice.  ° °IF you received labwork today, you will receive an invoice from [facility]. Please contact [facility] at [phone] with questions or concerns regarding your invoice.  ° °Our billing staff will not be able to assist you with questions regarding bills from these companies. ° °You will be contacted with the lab results as soon as they are available. The fastest way to get your results is to activate your My Chart account. Instructions are located on the last page of this paperwork. If you have not heard from us regarding the results in 2 weeks, please contact this office. °  ° ° ° °

## 2018-10-23 LAB — CBC
Hematocrit: 57.5 % — ABNORMAL HIGH (ref 37.5–51.0)
Hemoglobin: 18.4 g/dL — ABNORMAL HIGH (ref 13.0–17.7)
MCH: 28.1 pg (ref 26.6–33.0)
MCHC: 32 g/dL (ref 31.5–35.7)
MCV: 88 fL (ref 79–97)
Platelets: 355 10*3/uL (ref 150–450)
RBC: 6.54 x10E6/uL — ABNORMAL HIGH (ref 4.14–5.80)
RDW: 14.5 % (ref 11.6–15.4)
WBC: 11.5 10*3/uL — ABNORMAL HIGH (ref 3.4–10.8)

## 2018-10-23 LAB — COMPREHENSIVE METABOLIC PANEL
ALT: 62 IU/L — ABNORMAL HIGH (ref 0–44)
AST: 40 IU/L (ref 0–40)
Albumin/Globulin Ratio: 1.8 (ref 1.2–2.2)
Albumin: 4.6 g/dL (ref 4.0–5.0)
Alkaline Phosphatase: 59 IU/L (ref 39–117)
BUN/Creatinine Ratio: 11 (ref 9–20)
BUN: 14 mg/dL (ref 6–24)
Bilirubin Total: 0.4 mg/dL (ref 0.0–1.2)
CO2: 22 mmol/L (ref 20–29)
Calcium: 10.3 mg/dL — ABNORMAL HIGH (ref 8.7–10.2)
Chloride: 99 mmol/L (ref 96–106)
Creatinine, Ser: 1.27 mg/dL (ref 0.76–1.27)
GFR calc Af Amer: 77 mL/min/{1.73_m2} (ref >59)
GFR calc non Af Amer: 66 mL/min/{1.73_m2} (ref >59)
Globulin, Total: 2.5 g/dL (ref 1.5–4.5)
Glucose: 87 mg/dL (ref 65–99)
Potassium: 4.8 mmol/L (ref 3.5–5.2)
Sodium: 140 mmol/L (ref 134–144)
Total Protein: 7.1 g/dL (ref 6.0–8.5)

## 2018-11-02 NOTE — Addendum Note (Signed)
Addended by: Rutherford Guys on: 11/02/2018 10:23 AM   Modules accepted: Orders

## 2018-12-14 ENCOUNTER — Telehealth: Payer: Self-pay | Admitting: Family Medicine

## 2018-12-14 DIAGNOSIS — N401 Enlarged prostate with lower urinary tract symptoms: Secondary | ICD-10-CM

## 2018-12-14 NOTE — Telephone Encounter (Signed)
doxazosin (CARDURA) 1 MG tablet    Patient is requesting call back from [place] regarding this medication. Patient would like to increase dosage, if possible, as he feels medication is not effective.

## 2018-12-18 MED ORDER — DOXAZOSIN MESYLATE 4 MG PO TABS: 4.0000 mg | ORAL_TABLET | Freq: Every day | ORAL | 3 refills | Status: DC

## 2018-12-18 NOTE — Telephone Encounter (Signed)
Please let patient know that I increased to 4mg  daily. He needs to monitor BP and HR. Let me know if having symptoms or actually low (less then 100/60 or less than 60). thanks

## 2018-12-19 NOTE — Telephone Encounter (Signed)
Informed pt of medication change and providers advise about checking his B/P and giving [place] a call if he has a low B/P or anymore concerns.

## 2019-01-07 DIAGNOSIS — M19171 Post-traumatic osteoarthritis, right ankle and foot: Secondary | ICD-10-CM | POA: Insufficient documentation

## 2019-01-07 DIAGNOSIS — M19071 Primary osteoarthritis, right ankle and foot: Secondary | ICD-10-CM | POA: Insufficient documentation

## 2019-01-07 DIAGNOSIS — M21171 Varus deformity, not elsewhere classified, right ankle: Secondary | ICD-10-CM | POA: Insufficient documentation

## 2019-01-07 DIAGNOSIS — M25371 Other instability, right ankle: Secondary | ICD-10-CM | POA: Insufficient documentation

## 2019-02-18 ENCOUNTER — Ambulatory Visit (INDEPENDENT_AMBULATORY_CARE_PROVIDER_SITE_OTHER): Payer: BC Managed Care – PPO | Admitting: Family Medicine

## 2019-02-18 ENCOUNTER — Other Ambulatory Visit: Payer: Self-pay

## 2019-02-18 ENCOUNTER — Encounter: Payer: Self-pay | Admitting: Family Medicine

## 2019-02-18 VITALS — BP 124/86 | HR 87 | Temp 98.1°F | Ht 70.0 in | Wt 246.6 lb

## 2019-02-18 DIAGNOSIS — N401 Enlarged prostate with lower urinary tract symptoms: Secondary | ICD-10-CM

## 2019-02-18 DIAGNOSIS — R8290 Unspecified abnormal findings in urine: Secondary | ICD-10-CM | POA: Diagnosis not present

## 2019-02-18 DIAGNOSIS — R748 Abnormal levels of other serum enzymes: Secondary | ICD-10-CM

## 2019-02-18 DIAGNOSIS — R351 Nocturia: Secondary | ICD-10-CM

## 2019-02-18 DIAGNOSIS — D751 Secondary polycythemia: Secondary | ICD-10-CM | POA: Diagnosis not present

## 2019-02-18 LAB — POCT URINALYSIS DIP (MANUAL ENTRY)
Bilirubin, UA: NEGATIVE
Glucose, UA: NEGATIVE mg/dL
Ketones, POC UA: NEGATIVE mg/dL
Leukocytes, UA: NEGATIVE
Nitrite, UA: NEGATIVE
Protein Ur, POC: 30 mg/dL — AB
Spec Grav, UA: 1.02 (ref 1.010–1.025)
Urobilinogen, UA: 1 E.U./dL
pH, UA: 7 (ref 5.0–8.0)

## 2019-02-18 NOTE — Progress Notes (Signed)
12/7/20204:59 PM  Jose Shaffer Jan 21, 1971, 48 y.o., male [id number]  Chief Complaint  Patient presents with  . Follow-up    chronic condition, having pain on the right side, says that he notices his urine smell has gotten stronger    HPI:   Patient is a 48 y.o. 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   Last OV aug 2020  Referred to psych for ADD - confirmed dx ADD with GAD and social anxiety  ADD being managed by psych now Reports doing well on current regime  Feels anxiety is well controlled  Having severe recurrent epigastric abd pain, bloating associated with pale stools and mild/stich pain in RUQ pain After eating fatty meals  Continues to work on weaning off anabolic steroids  Feeling fatigued with urine having a chemical smell No dysuria or hematuria BPH well controlled  Lab Results  Component Value Date   WBC 11.5 (H) 10/22/2018   HGB 18.4 (H) 10/22/2018   HCT 57.5 (H) 10/22/2018   MCV 88 10/22/2018   PLT 355 10/22/2018   Lab Results  Component Value Date   ALT 62 (H) 10/22/2018   AST 40 10/22/2018   ALKPHOS 59 10/22/2018   BILITOT 0.4 10/22/2018    Depression screen PHQ 2/9 02/18/2019 02/18/2019 10/22/2018  Decreased Interest 0 0 0  Down, Depressed, Hopeless 0 0 0  PHQ - 2 Score 0 0 0  Altered sleeping - - -  Tired, decreased energy - - -  Change in appetite - - -  Feeling bad or failure about yourself  - - -  Trouble concentrating - - -  Moving slowly or fidgety/restless - - -  Suicidal thoughts - - -  PHQ-9 Score - - -  Difficult doing work/chores - - -    Fall Risk  02/18/2019 10/22/2018 08/21/2018 06/21/2018 05/22/2018  Falls in the past year? 0 0 - 0 0  Number falls in past yr: 0 0 - 0 -  Injury with Fall? 0 0 0 0 -  Follow up - - - Falls evaluation completed Falls evaluation completed     Allergies  Allergen Reactions  . Ibuprofen Other (See Comments)   Per patient feels like KNOT in throat and itching with rash  . Tramadol Anxiety    Prior to Admission medications   Medication Sig Start Date End Date Taking? Authorizing Provider  amphetamine-dextroamphetamine (ADDERALL XR) 20 MG 24 hr capsule Take 1 capsule (20 mg total) by mouth every morning. 10/22/18  Yes [name], MD  amphetamine-dextroamphetamine (ADDERALL XR) 20 MG 24 hr capsule Take 1 capsule (20 mg total) by mouth daily. Ok to fill 30 days after signature 10/22/18  Yes [name], MD  doxazosin (CARDURA) 4 MG tablet Take 1 tablet (4 mg total) by mouth daily. 12/18/18  Yes [name], MD  escitalopram (LEXAPRO) 10 MG tablet Take 1 tablet (10 mg total) by mouth daily. Patient taking differently: Take 5 mg by mouth at bedtime.  04/25/18  Yes [name], MD  fluticasone [facility]) 50 MCG/ACT nasal spray Place 1 spray into both nostrils every evening.    Yes [provider]  montelukast (SINGULAIR) 10 MG tablet Take 1 tablet (10 mg total) by mouth at bedtime. 10/22/18  Yes [name], MD  naproxen sodium (ALEVE) 220 MG tablet Take 440 mg by mouth 2 (two) times daily as needed (pain).    Yes  [provider]    Past Medical History:  Diagnosis Date  . ADD (attention deficit disorder)   . Allergy   . Anxiety state, unspecified 03/10/2013  . Diverticulitis   . Insomnia   . Long-term current use of testosterone replacement therapy   . Sleep apnea     Past Surgical History:  Procedure Laterality Date  . INGUINAL HERNIA REPAIR Bilateral 01/22/2015   Procedure: LAPAROSCOPIC BILATERAL INGUINAL HERNIA REPAIR WITH MESH AND UMBILICAL HERNIA REPAIR;  Surgeon: Abigail Miyamoto, MD;  Location: [facility];  Service: General;  Laterality: Bilateral;  . QUADRICEPS TENDON REPAIR  08/2008  . UMBILICAL HERNIA REPAIR N/A 01/22/2015   Procedure: UMBILICAL HERNIA REPAIR;  Surgeon: Abigail Miyamoto, MD;  Location: [facility];   Service: General;  Laterality: N/A;    Social History   Tobacco Use  . Smoking status: Never Smoker  . Smokeless tobacco: Never Used  Substance Use Topics  . Alcohol use: Yes    Comment: rare     Family History  Problem Relation Age of Onset  . Hypertension Father   . Colon polyps Father   . Cancer Maternal Grandmother 97       breast cancer  . Hypertension Paternal Grandmother   . COPD Paternal Grandfather        lung cancer; tobacco use, Doctor, general practice  . Cancer Maternal Aunt        breast cancer, late 46's  . Breast cancer Maternal Aunt   . Cancer Maternal Aunt        breast cancer; diagnosed late 28's  . Breast cancer Maternal Aunt   . Colon cancer Neg Hx   . Rectal cancer Neg Hx   . Stomach cancer Neg Hx   . Esophageal cancer Neg Hx     Review of Systems  Constitutional: Negative for chills and fever.  Respiratory: Negative for cough and shortness of breath.   Cardiovascular: Negative for chest pain, palpitations and leg swelling.  Gastrointestinal: Positive for abdominal pain, heartburn and nausea. Negative for blood in stool, melena and vomiting.     OBJECTIVE:  Today's Vitals   02/18/19 1649  BP: 124/86  Pulse: 87  Temp: 98.1 F (36.7 C)  SpO2: 96%  Weight: 246 lb 9.6 oz (111.9 kg)  Height: 5\' 10"  (1.778 m)   Body mass index is 35.38 kg/m.   Physical Exam Vitals signs and nursing note reviewed.  Constitutional:      Appearance: He is well-developed.  HENT:     Head: Normocephalic and atraumatic.  Eyes:     Conjunctiva/sclera: Conjunctivae normal.     Pupils: Pupils are equal, round, and reactive to light.  Neck:     Musculoskeletal: Neck supple.  Cardiovascular:     Rate and Rhythm: Normal rate and regular rhythm.     Heart sounds: No murmur. No friction rub. No gallop.   Pulmonary:     Effort: Pulmonary effort is normal.     Breath sounds: Normal breath sounds. No wheezing, rhonchi or rales.  Abdominal:     General: Bowel sounds  are normal. There is no distension.     Palpations: Abdomen is soft.     Tenderness: There is no abdominal tenderness.  Skin:    General: Skin is warm and dry.  Neurological:     Mental Status: He is alert and oriented to person, place, and time.     Results for orders placed or performed in visit on  02/18/19 (from the past 24 hour(s))  POCT urine dipstick     Status: Abnormal   Collection Time: 02/18/19  5:39 PM  Result Value Ref Range   Color, UA yellow yellow   Clarity, UA clear clear   Glucose, UA negative negative mg/dL   Bilirubin, UA negative negative   Ketones, POC UA negative negative mg/dL   Spec Grav, UA 0.102 7.253 - 1.025   Blood, UA trace-intact (A) negative   pH, UA 7.0 5.0 - 8.0   Protein Ur, POC =30 (A) negative mg/dL   Urobilinogen, UA 1.0 0.2 or 1.0 E.U./dL   Nitrite, UA Negative Negative   Leukocytes, UA Negative Negative    No results found.   ASSESSMENT and PLAN  1. Abnormal liver enzymes Discussed avoidance of liver toxins, cont with bland diet. Labs/studies pending. Can be related to anabolic steroids, which he is weaning off from - US Abdomen Limited RUQ; Future - US Abdomen Limited RUQ - Comprehensive metabolic panel; Future - ANA,IFA RA Diag Pnl w/rflx Tit/Patn; Future - Ferritin; Future - Hepatitis C antibody; Future - Hepatitis B surface antigen; Future  2. Foul smelling urine - POCT urine dipstick - repeat BP - Urine Culture  3. Polycythemia, secondary Discussed risks of polycythemia. discsused blood donation - CBC; Future - Erythropoietin; Future  4. BPH associated with nocturia Controlled. Continue current regime.  - PSA; Future  Return in about 3 months (around 05/19/2019).    [name], MD Primary Care at [facility] [address] [place], [place] [address] Ph.  [phone] Fax [phone]

## 2019-02-18 NOTE — Patient Instructions (Signed)
° ° ° °  If you have lab work done today you will be contacted with your lab results within the next 2 weeks.  If you have not heard from us then please contact us. The fastest way to get your results is to register for My Chart. ° ° °IF you received an x-ray today, you will receive an invoice from [facility]. Please contact [facility] at [phone] with questions or concerns regarding your invoice.  ° °IF you received labwork today, you will receive an invoice from [facility]. Please contact [facility] at [phone] with questions or concerns regarding your invoice.  ° °Our billing staff will not be able to assist you with questions regarding bills from these companies. ° °You will be contacted with the lab results as soon as they are available. The fastest way to get your results is to activate your My Chart account. Instructions are located on the last page of this paperwork. If you have not heard from us regarding the results in 2 weeks, please contact this office. °  ° ° ° °

## 2019-02-19 ENCOUNTER — Encounter: Payer: Self-pay | Admitting: Family Medicine

## 2019-02-20 LAB — URINE CULTURE

## 2019-02-25 MED ORDER — AMOXICILLIN 500 MG PO CAPS: 500.0000 mg | ORAL_CAPSULE | Freq: Three times a day (TID) | ORAL | 0 refills | Status: DC

## 2019-02-25 NOTE — Addendum Note (Signed)
Addended by: Rutherford Guys on: 02/25/2019 01:10 PM   Modules accepted: Orders

## 2019-03-01 ENCOUNTER — Other Ambulatory Visit: Payer: Self-pay

## 2019-03-01 ENCOUNTER — Ambulatory Visit
Admission: RE | Admit: 2019-03-01 | Discharge: 2019-03-01 | Disposition: A | Discharge status: Home / Self Care | Payer: BC Managed Care – PPO | Source: Ambulatory Visit | Attending: Family Medicine | Admitting: Family Medicine

## 2019-03-01 DIAGNOSIS — R748 Abnormal levels of other serum enzymes: Secondary | ICD-10-CM

## 2019-05-01 ENCOUNTER — Other Ambulatory Visit: Payer: Self-pay | Admitting: Family Medicine

## 2019-05-15 ENCOUNTER — Other Ambulatory Visit: Payer: Self-pay

## 2019-05-15 ENCOUNTER — Ambulatory Visit: Payer: BC Managed Care – PPO

## 2019-05-15 DIAGNOSIS — N401 Enlarged prostate with lower urinary tract symptoms: Secondary | ICD-10-CM

## 2019-05-15 DIAGNOSIS — R748 Abnormal levels of other serum enzymes: Secondary | ICD-10-CM

## 2019-05-15 DIAGNOSIS — D751 Secondary polycythemia: Secondary | ICD-10-CM

## 2019-05-15 DIAGNOSIS — R351 Nocturia: Secondary | ICD-10-CM

## 2019-05-17 LAB — COMPREHENSIVE METABOLIC PANEL
ALT: 41 IU/L (ref 0–44)
AST: 55 IU/L — ABNORMAL HIGH (ref 0–40)
Albumin/Globulin Ratio: 2 (ref 1.2–2.2)
Albumin: 4.4 g/dL (ref 4.0–5.0)
Alkaline Phosphatase: 51 IU/L (ref 39–117)
BUN/Creatinine Ratio: 13 (ref 9–20)
BUN: 17 mg/dL (ref 6–24)
Bilirubin Total: 0.5 mg/dL (ref 0.0–1.2)
CO2: 21 mmol/L (ref 20–29)
Calcium: 9.4 mg/dL (ref 8.7–10.2)
Chloride: 102 mmol/L (ref 96–106)
Creatinine, Ser: 1.26 mg/dL (ref 0.76–1.27)
GFR calc Af Amer: 77 mL/min/{1.73_m2} (ref >59)
GFR calc non Af Amer: 67 mL/min/{1.73_m2} (ref >59)
Globulin, Total: 2.2 g/dL (ref 1.5–4.5)
Glucose: 83 mg/dL (ref 65–99)
Potassium: 4.7 mmol/L (ref 3.5–5.2)
Sodium: 139 mmol/L (ref 134–144)
Total Protein: 6.6 g/dL (ref 6.0–8.5)

## 2019-05-17 LAB — HEPATITIS B SURFACE ANTIGEN: Hepatitis B Surface Ag: NEGATIVE

## 2019-05-17 LAB — PSA: Prostate Specific Ag, Serum: 1.3 ng/mL (ref 0.0–4.0)

## 2019-05-17 LAB — CBC
Hematocrit: 56.8 % — ABNORMAL HIGH (ref 37.5–51.0)
Hemoglobin: 18.1 g/dL — ABNORMAL HIGH (ref 13.0–17.7)
MCH: 28.1 pg (ref 26.6–33.0)
MCHC: 31.9 g/dL (ref 31.5–35.7)
MCV: 88 fL (ref 79–97)
Platelets: 293 10*3/uL (ref 150–450)
RBC: 6.44 x10E6/uL — ABNORMAL HIGH (ref 4.14–5.80)
RDW: 15.1 % (ref 11.6–15.4)
WBC: 7.8 10*3/uL (ref 3.4–10.8)

## 2019-05-17 LAB — FERRITIN: Ferritin: 242 ng/mL (ref 30–400)

## 2019-05-17 LAB — ANA,IFA RA DIAG PNL W/RFLX TIT/PATN
ANA Titer 1: NEGATIVE
Cyclic Citrullin Peptide Ab: 5 units (ref 0–19)
Rheumatoid fact SerPl-aCnc: <10 IU/mL (ref 0.0–13.9)

## 2019-05-17 LAB — HEPATITIS C ANTIBODY: Hep C Virus Ab: <0.1 s/co ratio (ref 0.0–0.9)

## 2019-05-17 LAB — ERYTHROPOIETIN: Erythropoietin: 12.9 m[IU]/mL (ref 2.6–18.5)

## 2019-05-20 ENCOUNTER — Ambulatory Visit (INDEPENDENT_AMBULATORY_CARE_PROVIDER_SITE_OTHER): Payer: BC Managed Care – PPO | Admitting: Family Medicine

## 2019-05-20 ENCOUNTER — Other Ambulatory Visit: Payer: Self-pay

## 2019-05-20 ENCOUNTER — Encounter: Payer: Self-pay | Admitting: Family Medicine

## 2019-05-20 VITALS — BP 120/82 | HR 92 | Temp 97.8°F | Resp 18 | Ht 70.0 in | Wt 237.0 lb

## 2019-05-20 DIAGNOSIS — N401 Enlarged prostate with lower urinary tract symptoms: Secondary | ICD-10-CM

## 2019-05-20 DIAGNOSIS — D751 Secondary polycythemia: Secondary | ICD-10-CM

## 2019-05-20 DIAGNOSIS — F411 Generalized anxiety disorder: Secondary | ICD-10-CM | POA: Diagnosis not present

## 2019-05-20 DIAGNOSIS — R351 Nocturia: Secondary | ICD-10-CM

## 2019-05-20 DIAGNOSIS — F902 Attention-deficit hyperactivity disorder, combined type: Secondary | ICD-10-CM | POA: Diagnosis not present

## 2019-05-20 MED ORDER — DOXAZOSIN MESYLATE 8 MG PO TABS: 8.0000 mg | ORAL_TABLET | Freq: Every day | ORAL | 5 refills | Status: DC

## 2019-05-20 MED ORDER — ESCITALOPRAM OXALATE 5 MG PO TABS: 5.0000 mg | ORAL_TABLET | Freq: Every day | ORAL | 2 refills | Status: DC

## 2019-05-20 NOTE — Patient Instructions (Signed)
° ° ° °  If you have lab work done today you will be contacted with your lab results within the next 2 weeks.  If you have not heard from us then please contact us. The fastest way to get your results is to register for My Chart. ° ° °IF you received an x-ray today, you will receive an invoice from [facility]. Please contact [facility] at [phone] with questions or concerns regarding your invoice.  ° °IF you received labwork today, you will receive an invoice from [facility]. Please contact [facility] at [phone] with questions or concerns regarding your invoice.  ° °Our billing staff will not be able to assist you with questions regarding bills from these companies. ° °You will be contacted with the lab results as soon as they are available. The fastest way to get your results is to activate your My Chart account. Instructions are located on the last page of this paperwork. If you have not heard from us regarding the results in 2 weeks, please contact this office. °  ° ° ° °

## 2019-05-20 NOTE — Progress Notes (Signed)
3/8/20215:17 PM  Jose Shaffer January 12, 1971, 49 y.o., male [id number]  Chief Complaint  Patient presents with  . Medication Refill    discuss cardura  . Follow-up    go over labs    HPI:   Patient is a 49 y.o. 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 followup   Last OV Dec 2020 Overall doing ok Cont to see psych for ADD Anxiety overall well controlled, has noticed that if it starts escalating he will take 5mg  lexapro for about 1-2 weeks and this works really well for him He continues to wean off anabolics, currently taking testosterone ethanate 250mg  weekly and deca 75mg  weekly Most recent testosterone in between injections was 1162 After persistent hgb > 18 he donated blood, "double reds", did not tolerate at all, felt SOB, tired, nauseous for several days afterwards Most recent labs were done after blood donation Feels cardura is not working Nocturia q 2 hours, urgency with minimal UOP Taking cardura 4mg  with dinner He decided to defer surg re adhesions and recent partial SBO He has been managing with diet changes He is aware of ssx of concern  CBC Latest Ref Rng & Units 05/15/2019 10/22/2018 09/11/2018  WBC 3.4 - 10.8 x10E3/uL 7.8 11.5(H) 10.6(H)  Hemoglobin 13.0 - 17.7 g/dL 18.1(H) 18.4(H) 17.4(H)  Hematocrit 37.5 - 51.0 % 56.8(H) 57.5(H) 54.6(H)  Platelets 150 - 450 x10E3/uL 293 355 220   epo 12.9 Ferritin 242 psa 1.3  Depression screen [facility] 2/9 02/18/2019 02/18/2019 10/22/2018  Decreased Interest 0 0 0  Down, Depressed, Hopeless 0 0 0  PHQ - 2 Score 0 0 0  Altered sleeping - - -  Tired, decreased energy - - -  Change in appetite - - -  Feeling bad or failure about yourself  - - -  Trouble concentrating - - -  Moving slowly or fidgety/restless - - -  Suicidal thoughts - - -  PHQ-9 Score - - -  Difficult doing work/chores - - -    Fall Risk  05/20/2019 02/18/2019 10/22/2018 08/21/2018 06/21/2018   Falls in the past year? 0 0 0 - 0  Number falls in past yr: 0 0 0 - 0  Injury with Fall? 0 0 0 0 0  Follow up Falls evaluation completed - - - Falls evaluation completed     Allergies  Allergen Reactions  . Ibuprofen Other (See Comments)    Per patient feels like KNOT in throat and itching with rash  . Tramadol Anxiety    Prior to Admission medications   Medication Sig Start Date End Date Taking? Authorizing Provider  amphetamine-dextroamphetamine (ADDERALL XR) 20 MG 24 hr capsule Take 1 capsule (20 mg total) by mouth every morning. 10/22/18  Yes Rutherford Guys, MD  amphetamine-dextroamphetamine (ADDERALL XR) 20 MG 24 hr capsule Take 1 capsule (20 mg total) by mouth daily. Ok to fill 30 days after signature 10/22/18  Yes Rutherford Guys, MD  doxazosin (CARDURA) 4 MG tablet Take 1 tablet (4 mg total) by mouth daily. 12/18/18  Yes Rutherford Guys, MD  fluticasone (FLONASE) 50 MCG/ACT nasal spray Place 1 spray into both nostrils every evening.    Yes [provider]  montelukast (SINGULAIR) 10 MG tablet TAKE 1 TABLET(10 MG) BY MOUTH AT BEDTIME 05/01/19  Yes Rutherford Guys, MD  naproxen sodium (ALEVE) 220 MG tablet Take 440 mg by mouth 2 (two) times daily as needed (pain).    Yes [provider]  amoxicillin (AMOXIL) 500 MG capsule Take 1 capsule (500 mg total) by mouth 3 (three) times daily. 02/25/19   [name], MD  escitalopram (LEXAPRO) 10 MG tablet Take 1 tablet (10 mg total) by mouth daily. Patient taking differently: Take 5 mg by mouth at bedtime.  04/25/18   [name], MD    Past Medical History:  Diagnosis Date  . ADD (attention deficit disorder)   . Allergy   . Anxiety state, unspecified 03/10/2013  . Diverticulitis   . Insomnia   . Long-term current use of testosterone replacement therapy   . Sleep apnea     Past Surgical History:  Procedure Laterality Date  . INGUINAL HERNIA REPAIR Bilateral 01/22/2015   Procedure: LAPAROSCOPIC  BILATERAL INGUINAL HERNIA REPAIR WITH MESH AND UMBILICAL HERNIA REPAIR;  Surgeon: Abigail Miyamoto, MD;  Location: [facility];  Service: General;  Laterality: Bilateral;  . QUADRICEPS TENDON REPAIR  08/2008  . UMBILICAL HERNIA REPAIR N/A 01/22/2015   Procedure: UMBILICAL HERNIA REPAIR;  Surgeon: Abigail Miyamoto, MD;  Location: [facility];  Service: General;  Laterality: N/A;    Social History   Tobacco Use  . Smoking status: Never Smoker  . Smokeless tobacco: Never Used  Substance Use Topics  . Alcohol use: Yes    Comment: rare     Family History  Problem Relation Age of Onset  . Hypertension Father   . Colon polyps Father   . Cancer Maternal Grandmother 78       breast cancer  . Hypertension Paternal Grandmother   . COPD Paternal Grandfather        lung cancer; tobacco use, Doctor, general practice  . Cancer Maternal Aunt        breast cancer, late 5's  . Breast cancer Maternal Aunt   . Cancer Maternal Aunt        breast cancer; diagnosed late 43's  . Breast cancer Maternal Aunt   . Colon cancer Neg Hx   . Rectal cancer Neg Hx   . Stomach cancer Neg Hx   . Esophageal cancer Neg Hx     Review of Systems  Constitutional: Negative for chills and fever.  Respiratory: Negative for cough and shortness of breath.   Cardiovascular: Negative for chest pain, palpitations and leg swelling.  Gastrointestinal: Negative for abdominal pain, nausea and vomiting.     OBJECTIVE:  Today's Vitals   05/20/19 1711 05/20/19 1723  BP: (!) 146/88 120/82  Pulse: 92   Resp: 18   Temp: 97.8 F (36.6 C)   TempSrc: Temporal   SpO2: 97%   Weight: 237 lb (107.5 kg)   Height: 5\' 10"  (1.778 m)    Body mass index is 34.01 kg/m.   Physical Exam Vitals and nursing note reviewed.  Constitutional:      Appearance: He is well-developed.  HENT:     Head: Normocephalic and atraumatic.  Eyes:     Conjunctiva/sclera: Conjunctivae normal.     Pupils: Pupils are  equal, round, and reactive to light.  Cardiovascular:     Rate and Rhythm: Normal rate and regular rhythm.     Heart sounds: No murmur. No friction rub. No gallop.   Pulmonary:     Effort: Pulmonary effort is normal.     Breath sounds: Normal breath sounds. No wheezing or rales.  Musculoskeletal:     Cervical back: Neck supple.     Right lower leg: No edema.  Left lower leg: No edema.  Skin:    General: Skin is warm and dry.  Neurological:     Mental Status: He is alert and oriented to person, place, and time.     No results found for this or any previous visit (from the past 24 hour(s)).  No results found.   ASSESSMENT and PLAN  1. BPH associated with nocturia Not controlled. Increase cardura to 8mg  daily. Reviewed r/se/b. Consider urology referral - doxazosin (CARDURA) 8 MG tablet; Take 1 tablet (8 mg total) by mouth daily.  2. Anxiety state Stable. Discussed unusual use of lexapro, ?placebo effect? However overall no harm, therefore will continue  3. Polycythemia Thought to be 2/2 anabolic steroids, which he continues to wean off from. However normal EPO, denies sx PV, refer to hematology to see if further eval needed and to comment on target H/H. Reviewed risks of polycythemia.  - Ambulatory referral to Hematology  4. Attention deficit hyperactivity disorder (ADHD), combined type Managed by psych  Other orders - ADDERALL XR 10 MG 24 hr capsule; Take 10 mg by mouth daily. - escitalopram (LEXAPRO) 5 MG tablet; Take 1 tablet (5 mg total) by mouth at bedtime.  Return in about 3 months (around 08/20/2019).    10/20/2019, MD Primary Care at [facility] [address] [place], [place] Ph.  [phone] Fax [phone]

## 2019-05-31 ENCOUNTER — Other Ambulatory Visit: Payer: Self-pay | Admitting: Family Medicine

## 2019-05-31 DIAGNOSIS — N401 Enlarged prostate with lower urinary tract symptoms: Secondary | ICD-10-CM

## 2019-05-31 DIAGNOSIS — R351 Nocturia: Secondary | ICD-10-CM

## 2019-06-10 ENCOUNTER — Telehealth: Payer: Self-pay | Admitting: Hematology and Oncology

## 2019-06-10 NOTE — Telephone Encounter (Signed)
Received a new hem referral from Dr. Leretha Pol at [facility] for polycythemia. Jose Shaffer has been cld and scheduled to see Dr. Pamelia Hoit on 4/20 at 1pm. Pt aware to arrive 15 minutes early.

## 2019-07-02 ENCOUNTER — Inpatient Hospital Stay: Payer: BC Managed Care – PPO | Admitting: Hematology and Oncology

## 2019-08-20 ENCOUNTER — Ambulatory Visit: Payer: BC Managed Care – PPO | Admitting: Family Medicine

## 2019-09-13 ENCOUNTER — Other Ambulatory Visit: Payer: Self-pay

## 2019-09-13 ENCOUNTER — Ambulatory Visit (INDEPENDENT_AMBULATORY_CARE_PROVIDER_SITE_OTHER): Payer: BC Managed Care – PPO | Admitting: Family Medicine

## 2019-09-13 ENCOUNTER — Encounter: Payer: Self-pay | Admitting: Family Medicine

## 2019-09-13 VITALS — BP 130/80 | HR 83 | Temp 98.0°F | Ht 70.0 in | Wt 249.9 lb

## 2019-09-13 DIAGNOSIS — J302 Other seasonal allergic rhinitis: Secondary | ICD-10-CM

## 2019-09-13 DIAGNOSIS — N401 Enlarged prostate with lower urinary tract symptoms: Secondary | ICD-10-CM | POA: Diagnosis not present

## 2019-09-13 DIAGNOSIS — F902 Attention-deficit hyperactivity disorder, combined type: Secondary | ICD-10-CM

## 2019-09-13 DIAGNOSIS — R748 Abnormal levels of other serum enzymes: Secondary | ICD-10-CM

## 2019-09-13 DIAGNOSIS — D751 Secondary polycythemia: Secondary | ICD-10-CM

## 2019-09-13 DIAGNOSIS — R351 Nocturia: Secondary | ICD-10-CM

## 2019-09-13 MED ORDER — MONTELUKAST SODIUM 10 MG PO TABS: ORAL_TABLET | ORAL | 1 refills | Status: DC

## 2019-09-13 MED ORDER — DOXAZOSIN MESYLATE 8 MG PO TABS: 8.0000 mg | ORAL_TABLET | Freq: Every day | ORAL | 1 refills | Status: DC

## 2019-09-13 NOTE — Progress Notes (Signed)
7/2/20214:26 PM  Jose Shaffer December 09, 1970, 49 y.o., male [id number]  Chief Complaint  Patient presents with  . Medication Refill    HPI:   Patient is a 49 y.o. 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 followup  Last OV march 2021 - increased cardura to 8mg  daily - helps, drinking about a gallon a day  referred to heme for polycythemia, referral cancelled by patient Last psych OV June 2021 - started new regime, working well but wakes him up after about 7 hours of sleep Donated blood in may Has decreased testosterone to 187mg  a week  Depression screen [facility] 2/9 02/18/2019 02/18/2019 10/22/2018  Decreased Interest 0 0 0  Down, Depressed, Hopeless 0 0 0  PHQ - 2 Score 0 0 0  Altered sleeping - - -  Tired, decreased energy - - -  Change in appetite - - -  Feeling bad or failure about yourself  - - -  Trouble concentrating - - -  Moving slowly or fidgety/restless - - -  Suicidal thoughts - - -  PHQ-9 Score - - -  Difficult doing work/chores - - -    Fall Risk  09/13/2019 05/20/2019 02/18/2019 10/22/2018 08/21/2018  Falls in the past year? 0 0 0 0 -  Number falls in past yr: 0 0 0 0 -  Injury with Fall? 0 0 0 0 0  Follow up - Falls evaluation completed - - -     Allergies  Allergen Reactions  . Ibuprofen Other (See Comments)    Per patient feels like KNOT in throat and itching with rash  . Tramadol Anxiety    Prior to Admission medications   Medication Sig Start Date End Date Taking? Authorizing Provider  doxazosin (CARDURA) 8 MG tablet Take 1 tablet (8 mg total) by mouth daily. 05/20/19  Yes 10/21/2018, MD  escitalopram (LEXAPRO) 5 MG tablet Take 1 tablet (5 mg total) by mouth at bedtime. 05/20/19  Yes [name], MD  fluticasone [facility]) 50 MCG/ACT nasal spray Place 1 spray into both nostrils every evening.    Yes [provider]  guanFACINE (INTUNIV) 1 MG TB24 ER tablet Take 1 mg by mouth  daily. 09/11/19  Yes [provider]  JORNAY PM 40 MG CP24 Take 1 capsule by mouth at bedtime. 09/11/19  Yes [provider]  montelukast (SINGULAIR) 10 MG tablet TAKE 1 TABLET(10 MG) BY MOUTH AT BEDTIME 05/01/19  Yes 09/13/19, MD  naproxen sodium (ALEVE) 220 MG tablet Take 440 mg by mouth 2 (two) times daily as needed (pain).    Yes [provider]    Past Medical History:  Diagnosis Date  . ADD (attention deficit disorder)   . Allergy   . Anxiety state, unspecified 03/10/2013  . Diverticulitis   . Insomnia   . Long-term current use of testosterone replacement therapy   . Sleep apnea     Past Surgical History:  Procedure Laterality Date  . INGUINAL HERNIA REPAIR Bilateral 01/22/2015   Procedure: LAPAROSCOPIC BILATERAL INGUINAL HERNIA REPAIR WITH MESH AND UMBILICAL HERNIA REPAIR;  Surgeon: 03/12/2013, MD;  Location: [facility];  Service: General;  Laterality: Bilateral;  . QUADRICEPS TENDON REPAIR  08/2008  . UMBILICAL HERNIA REPAIR N/A 01/22/2015   Procedure: UMBILICAL HERNIA REPAIR;  Surgeon: 09/2008, MD;  Location: [facility];  Service: General;  Laterality: N/A;    Social History   Tobacco Use  .  Smoking status: Never Smoker  . Smokeless tobacco: Never Used  Substance Use Topics  . Alcohol use: Yes    Comment: rare     Family History  Problem Relation Age of Onset  . Hypertension Father   . Colon polyps Father   . Cancer Maternal Grandmother 46       breast cancer  . Hypertension Paternal Grandmother   . COPD Paternal Grandfather        lung cancer; tobacco use, Doctor, general practice  . Cancer Maternal Aunt        breast cancer, late 23's  . Breast cancer Maternal Aunt   . Cancer Maternal Aunt        breast cancer; diagnosed late 55's  . Breast cancer Maternal Aunt   . Colon cancer Neg Hx   . Rectal cancer Neg Hx   . Stomach cancer Neg Hx   . Esophageal cancer Neg Hx     Review of  Systems  Constitutional: Negative for chills and fever.  Respiratory: Negative for cough and shortness of breath.   Cardiovascular: Negative for chest pain, palpitations and leg swelling.  Gastrointestinal: Negative for abdominal pain, nausea and vomiting.     OBJECTIVE:  Today's Vitals   09/13/19 1619  BP: 130/80  Pulse: 83  Temp: 98 F (36.7 C)  SpO2: 98%  Weight: 249 lb 14.4 oz (113.4 kg)  Height: 5\' 10"  (1.778 m)   Body mass index is 35.86 kg/m.   Physical Exam Vitals and nursing note reviewed.  Constitutional:      Appearance: He is well-developed.  HENT:     Head: Normocephalic and atraumatic.  Eyes:     Conjunctiva/sclera: Conjunctivae normal.     Pupils: Pupils are equal, round, and reactive to light.  Cardiovascular:     Rate and Rhythm: Normal rate and regular rhythm.     Heart sounds: No murmur heard.  No friction rub. No gallop.   Pulmonary:     Effort: Pulmonary effort is normal.     Breath sounds: Normal breath sounds. No wheezing or rales.  Musculoskeletal:     Cervical back: Neck supple.  Skin:    General: Skin is warm and dry.  Neurological:     Mental Status: He is alert and oriented to person, place, and time.     No results found for this or any previous visit (from the past 24 hour(s)).  No results found.   ASSESSMENT and PLAN  1. BPH associated with nocturia Controlled. Continue current regime.  - doxazosin (CARDURA) 8 MG tablet; Take 1 tablet (8 mg total) by mouth daily.  2. Seasonal allergies Controlled. Continue current regime.   3. Polycythemia - CBC; Future  4. Abnormal liver enzymes - Comprehensive metabolic panel; Future  5. Attention deficit hyperactivity disorder (ADHD), combined type Managed by attention specialist  Other orders   Return in about 3 months (around 12/14/2019).    02/13/2020, MD Primary Care at [facility] [address] [place], [place] Ph.  [phone] Fax  [phone]

## 2019-09-13 NOTE — Patient Instructions (Signed)
° ° ° °  If you have lab work done today you will be contacted with your lab results within the next 2 weeks.  If you have not heard from us then please contact us. The fastest way to get your results is to register for My Chart. ° ° °IF you received an x-ray today, you will receive an invoice from [facility]. Please contact [facility] at [phone] with questions or concerns regarding your invoice.  ° °IF you received labwork today, you will receive an invoice from [facility]. Please contact [facility] at [phone] with questions or concerns regarding your invoice.  ° °Our billing staff will not be able to assist you with questions regarding bills from these companies. ° °You will be contacted with the lab results as soon as they are available. The fastest way to get your results is to activate your My Chart account. Instructions are located on the last page of this paperwork. If you have not heard from us regarding the results in 2 weeks, please contact this office. °  ° ° ° °

## 2019-09-17 ENCOUNTER — Other Ambulatory Visit: Payer: Self-pay | Admitting: Family Medicine

## 2019-09-27 ENCOUNTER — Other Ambulatory Visit: Payer: Self-pay | Admitting: Family Medicine

## 2019-09-27 DIAGNOSIS — R351 Nocturia: Secondary | ICD-10-CM

## 2019-12-13 ENCOUNTER — Other Ambulatory Visit: Payer: Self-pay

## 2019-12-13 ENCOUNTER — Telehealth (INDEPENDENT_AMBULATORY_CARE_PROVIDER_SITE_OTHER): Payer: BC Managed Care – PPO | Admitting: Family Medicine

## 2019-12-13 ENCOUNTER — Encounter: Payer: Self-pay | Admitting: Family Medicine

## 2019-12-13 VITALS — Ht 70.0 in | Wt 248.0 lb

## 2019-12-13 DIAGNOSIS — J302 Other seasonal allergic rhinitis: Secondary | ICD-10-CM

## 2019-12-13 DIAGNOSIS — R351 Nocturia: Secondary | ICD-10-CM | POA: Diagnosis not present

## 2019-12-13 DIAGNOSIS — N401 Enlarged prostate with lower urinary tract symptoms: Secondary | ICD-10-CM | POA: Diagnosis not present

## 2019-12-13 MED ORDER — MONTELUKAST SODIUM 10 MG PO TABS: ORAL_TABLET | ORAL | 3 refills | Status: DC

## 2019-12-13 MED ORDER — DOXAZOSIN MESYLATE 8 MG PO TABS: 8.0000 mg | ORAL_TABLET | Freq: Every day | ORAL | 3 refills | Status: DC

## 2019-12-13 NOTE — Progress Notes (Signed)
Virtual Visit Note  I connected with patient on 12/13/19 at 442pm by phone due to technical difficulties and verified that I am speaking with the correct person using two identifiers. Jose Shaffer is currently located at home and patient is currently with them during visit. The provider, [name], MD is located in their office at time of visit.  I discussed the limitations, risks, security and privacy concerns of performing an evaluation and management service by telephone and the availability of in person appointments. I also discussed with the patient that there may be a patient responsible charge related to this service. The patient expressed understanding and agreed to proceed.   I provided 15 minutes of non-face-to-face time during this encounter.  Chief Complaint  Patient presents with  . Medical Management of Chronic Issues    3 m f.u   . Medication Management    HPI ? Overall doing well Diagnosed with covid on sept 13 Having some fatigue, joint pain and stiffness No swelling or redness He is able to take [employer] daily, keeps with exercise induced asthma under control BPH well controlled on cordura Cont to wean off anabolics (currently testosterone 135mg  weekly)  Allergies  Allergen Reactions  . Ibuprofen Other (See Comments)    Per patient feels like KNOT in throat and itching with rash  . Tramadol Anxiety    Prior to Admission medications   Medication Sig Start Date End Date Taking? Authorizing Provider  doxazosin (CARDURA) 8 MG tablet Take 1 tablet (8 mg total) by mouth daily. 09/13/19  Yes 11/14/19, MD  fluticasone (FLONASE) 50 MCG/ACT nasal spray Place 1 spray into both nostrils every evening.    Yes [provider]  guanFACINE (INTUNIV) 1 MG TB24 ER tablet Take 1 mg by mouth daily. 09/11/19  Yes [provider]  JORNAY PM 80 MG CP24 Take 1 capsule by mouth at bedtime. 11/29/19  Yes [provider]    montelukast (SINGULAIR) 10 MG tablet TAKE 1 TABLET(10 MG) BY MOUTH AT BEDTIME 09/13/19  Yes 11/14/19, MD  naproxen sodium (ALEVE) 220 MG tablet Take 440 mg by mouth 2 (two) times daily as needed (pain).    Yes [provider]  escitalopram (LEXAPRO) 5 MG tablet TAKE 1 TABLET(5 MG) BY MOUTH AT BEDTIME Patient not taking: Reported on 12/13/2019 09/27/19   09/29/19, MD    Past Medical History:  Diagnosis Date  . ADD (attention deficit disorder)   . Allergy   . Anxiety state, unspecified 03/10/2013  . Diverticulitis   . Insomnia   . Long-term current use of testosterone replacement therapy   . Sleep apnea     Past Surgical History:  Procedure Laterality Date  . INGUINAL HERNIA REPAIR Bilateral 01/22/2015   Procedure: LAPAROSCOPIC BILATERAL INGUINAL HERNIA REPAIR WITH MESH AND UMBILICAL HERNIA REPAIR;  Surgeon: 13/12/2014, MD;  Location: [facility];  Service: General;  Laterality: Bilateral;  . QUADRICEPS TENDON REPAIR  08/2008  . UMBILICAL HERNIA REPAIR N/A 01/22/2015   Procedure: UMBILICAL HERNIA REPAIR;  Surgeon: 13/12/2014, MD;  Location: [facility];  Service: General;  Laterality: N/A;    Social History   Tobacco Use  . Smoking status: Never Smoker  . Smokeless tobacco: Never Used  Substance Use Topics  . Alcohol use: Yes    Comment: rare     Family History  Problem Relation Age of Onset  . Hypertension Father   .  Colon polyps Father   . Cancer Maternal Grandmother 70       breast cancer  . Hypertension Paternal Grandmother   . COPD Paternal Grandfather        lung cancer; tobacco use, Doctor, general practice  . Cancer Maternal Aunt        breast cancer, late 51's  . Breast cancer Maternal Aunt   . Cancer Maternal Aunt        breast cancer; diagnosed late 55's  . Breast cancer Maternal Aunt   . Colon cancer Neg Hx   . Rectal cancer Neg Hx   . Stomach cancer Neg Hx   . Esophageal cancer Neg Hx      ROS Per hpi  Objective  Vitals as reported by the patient: none   ASSESSMENT and PLAN  1. BPH associated with nocturia - doxazosin (CARDURA) 8 MG tablet; Take 1 tablet (8 mg total) by mouth daily.  2. Seasonal allergies - montelukast (SINGULAIR) 10 MG tablet; TAKE 1 TABLET(10 MG) BY MOUTH AT BEDTIME  FOLLOW-UP: 1 year   The above assessment and management plan was discussed with the patient. The patient verbalized understanding of and has agreed to the management plan. Patient is aware to call the clinic if symptoms persist or worsen. Patient is aware when to return to the clinic for a follow-up visit. Patient educated on when it is appropriate to go to the emergency department.     [name], MD Primary Care at [facility] [address] [place], [place] [address] Ph.  [phone] Fax [phone]

## 2019-12-13 NOTE — Patient Instructions (Signed)
° ° ° °  If you have lab work done today you will be contacted with your lab results within the next 2 weeks.  If you have not heard from us then please contact us. The fastest way to get your results is to register for My Chart. ° ° °IF you received an x-ray today, you will receive an invoice from [facility]. Please contact [facility] at [phone] with questions or concerns regarding your invoice.  ° °IF you received labwork today, you will receive an invoice from [facility]. Please contact [facility] at [phone] with questions or concerns regarding your invoice.  ° °Our billing staff will not be able to assist you with questions regarding bills from these companies. ° °You will be contacted with the lab results as soon as they are available. The fastest way to get your results is to activate your My Chart account. Instructions are located on the last page of this paperwork. If you have not heard from us regarding the results in 2 weeks, please contact this office. °  ° ° ° °

## 2020-04-02 ENCOUNTER — Other Ambulatory Visit: Payer: Self-pay | Admitting: Family Medicine

## 2020-04-02 NOTE — Telephone Encounter (Signed)
Requested medication (s) are due for refill today: no  Requested medication (s) are on the active medication list: no  Last refill:  discontinued on 12/13/19  Future visit scheduled: yes  Notes to clinic:  Please review for refill. Medication discontinued on 12/13/19.     Requested Prescriptions  Pending Prescriptions Disp Refills   escitalopram (LEXAPRO) 5 MG tablet [Pharmacy Med Name: ESCITALOPRAM 5MG  TABLETS] 30 tablet 2    Sig: TAKE 1 TABLET(5 MG) BY MOUTH AT BEDTIME      Psychiatry:  Antidepressants - SSRI Passed - 04/02/2020  3:57 PM      Passed - Valid encounter within last 6 months    Recent Outpatient Visits           3 months ago BPH associated with nocturia   Primary Care at [facility], [facility], MD   6 months ago BPH associated with nocturia   Primary Care at [facility], [facility], MD   10 months ago BPH associated with nocturia   Primary Care at [facility], [facility], MD   1 year ago Abnormal liver enzymes   Primary Care at [facility], [facility], MD   1 year ago Medication management   Primary Care at [facility], [facility], MD       Future Appointments             In 1 month Just, Meda Coffee, FNP Primary Care at [place], [facility]

## 2020-05-27 ENCOUNTER — Encounter: Payer: Self-pay | Admitting: Family Medicine

## 2020-05-27 ENCOUNTER — Other Ambulatory Visit: Payer: Self-pay

## 2020-05-27 ENCOUNTER — Ambulatory Visit (INDEPENDENT_AMBULATORY_CARE_PROVIDER_SITE_OTHER): Payer: BC Managed Care – PPO | Admitting: Family Medicine

## 2020-05-27 VITALS — BP 136/83 | HR 87 | Temp 97.2°F | Ht 70.0 in | Wt 253.0 lb

## 2020-05-27 DIAGNOSIS — J302 Other seasonal allergic rhinitis: Secondary | ICD-10-CM

## 2020-05-27 DIAGNOSIS — R351 Nocturia: Secondary | ICD-10-CM | POA: Diagnosis not present

## 2020-05-27 DIAGNOSIS — N401 Enlarged prostate with lower urinary tract symptoms: Secondary | ICD-10-CM | POA: Diagnosis not present

## 2020-05-27 MED ORDER — FLUTICASONE PROPIONATE 50 MCG/ACT NA SUSP: 1.0000 | Freq: Every evening | NASAL | 3 refills | Status: AC

## 2020-05-27 MED ORDER — MONTELUKAST SODIUM 10 MG PO TABS: ORAL_TABLET | ORAL | 3 refills | Status: AC

## 2020-05-27 MED ORDER — DOXAZOSIN MESYLATE 8 MG PO TABS: 8.0000 mg | ORAL_TABLET | Freq: Every day | ORAL | 3 refills | Status: DC

## 2020-05-27 NOTE — Patient Instructions (Addendum)
Health Maintenance, Male Adopting a healthy lifestyle and getting preventive care are important in promoting health and wellness. Ask your health care provider about:  The right schedule for you to have regular tests and exams.  Things you can do on your own to prevent diseases and keep yourself healthy. What should I know about diet, weight, and exercise? Eat a healthy diet  Eat a diet that includes plenty of vegetables, fruits, low-fat dairy products, and lean protein.  Do not eat a lot of foods that are high in solid fats, added sugars, or sodium.   Maintain a healthy weight Body mass index (BMI) is a measurement that can be used to identify possible weight problems. It estimates body fat based on height and weight. Your health care provider can help determine your BMI and help you achieve or maintain a healthy weight. Get regular exercise Get regular exercise. This is one of the most important things you can do for your health. Most adults should:  Exercise for at least 150 minutes each week. The exercise should increase your heart rate and make you sweat (moderate-intensity exercise).  Do strengthening exercises at least twice a week. This is in addition to the moderate-intensity exercise.  Spend less time sitting. Even light physical activity can be beneficial. Watch cholesterol and blood lipids Have your blood tested for lipids and cholesterol at 51 years of age, then have this test every 5 years. You may need to have your cholesterol levels checked more often if:  Your lipid or cholesterol levels are high.  You are older than 51 years of age.  You are at high risk for heart disease. What should I know about cancer screening? Many types of cancers can be detected early and may often be prevented. Depending on your health history and family history, you may need to have cancer screening at various ages. This may include screening for:  Colorectal cancer.  Prostate  cancer.  Skin cancer.  Lung cancer. What should I know about heart disease, diabetes, and high blood pressure? Blood pressure and heart disease  High blood pressure causes heart disease and increases the risk of stroke. This is more likely to develop in people who have high blood pressure readings, are of African descent, or are overweight.  Talk with your health care provider about your target blood pressure readings.  Have your blood pressure checked: ? Every 3-5 years if you are 18-39 years of age. ? Every year if you are 40 years old or older.  If you are between the ages of 65 and 75 and are a current or former smoker, ask your health care provider if you should have a one-time screening for abdominal aortic aneurysm (AAA). Diabetes Have regular diabetes screenings. This checks your fasting blood sugar level. Have the screening done:  Once every three years after age 45 if you are at a normal weight and have a low risk for diabetes.  More often and at a younger age if you are overweight or have a high risk for diabetes. What should I know about preventing infection? Hepatitis B If you have a higher risk for hepatitis B, you should be screened for this virus. Talk with your health care provider to find out if you are at risk for hepatitis B infection. Hepatitis C Blood testing is recommended for:  Everyone born from 1945 through 1965.  Anyone with known risk factors for hepatitis C. Sexually transmitted infections (STIs)  You should be screened each   year for STIs, including gonorrhea and chlamydia, if: ? You are sexually active and are younger than 51 years of age. ? You are older than 51 years of age and your health care provider tells you that you are at risk for this type of infection. ? Your sexual activity has changed since you were last screened, and you are at increased risk for chlamydia or gonorrhea. Ask your health care provider if you are at risk.  Ask your  health care provider about whether you are at high risk for HIV. Your health care provider may recommend a prescription medicine to help prevent HIV infection. If you choose to take medicine to prevent HIV, you should first get tested for HIV. You should then be tested every 3 months for as long as you are taking the medicine. Follow these instructions at home: Lifestyle  Do not use any products that contain nicotine or tobacco, such as cigarettes, e-cigarettes, and chewing tobacco. If you need help quitting, ask your health care provider.  Do not use street drugs.  Do not share needles.  Ask your health care provider for help if you need support or information about quitting drugs. Alcohol use  Do not drink alcohol if your health care provider tells you not to drink.  If you drink alcohol: ? Limit how much you have to 0-2 drinks a day. ? Be aware of how much alcohol is in your drink. In the [place] one drink equals one 12 oz bottle of beer (355 mL), one 5 oz glass of wine (148 mL), or one 1 oz glass of hard liquor (44 mL). General instructions  Schedule regular health, dental, and eye exams.  Stay current with your vaccines.  Tell your health care provider if: ? You often feel depressed. ? You have ever been abused or do not feel safe at home. Summary  Adopting a healthy lifestyle and getting preventive care are important in promoting health and wellness.  Follow your health care provider's instructions about healthy diet, exercising, and getting tested or screened for diseases.  Follow your health care provider's instructions on monitoring your cholesterol and blood pressure. This information is not intended to replace advice given to you by your health care provider. Make sure you discuss any questions you have with your health care provider. Document Revised: 02/21/2018 Document Reviewed: 02/21/2018 Elsevier Patient Education  2021 Elsevier Inc.     If you have lab work  done today you will be contacted with your lab results within the next 2 weeks.  If you have not heard from us then please contact us. The fastest way to get your results is to register for My Chart.   IF you received an x-ray today, you will receive an invoice from [facility]. Please contact [facility] at [phone] with questions or concerns regarding your invoice.   IF you received labwork today, you will receive an invoice from [facility]. Please contact [facility] at [phone] with questions or concerns regarding your invoice.   Our billing staff will not be able to assist you with questions regarding bills from these companies.  You will be contacted with the lab results as soon as they are available. The fastest way to get your results is to activate your My Chart account. Instructions are located on the last page of this paperwork. If you have not heard from us regarding the results in 2 weeks, please contact this office.      

## 2020-05-27 NOTE — Progress Notes (Signed)
3/16/20224:20 PM  Jose Shaffer 1970-10-14, 50 y.o., male [id number]  Chief Complaint  Patient presents with  . Medication Refill  . bph with night time urination    Referral request    HPI:   Patient is a 50 y.o. 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   Denies acute issues at this time   BPH Has tried flomax and is on doxazosin During the day doesn't have many issues Does have a weak stream during the day  Up every 2 hours at night Would like to see urology for this  Allergies Well controlled on flonase and singulair Has used afrin in the past   Health Maintenance  Topic Date Due  . COVID-19 Vaccine (1) Never done  . INFLUENZA VACCINE  07/13/2020 (Originally 10/13/2019)  . COLONOSCOPY (Pts 45-56yrs Insurance coverage will need to be confirmed)  05/06/2027  . TETANUS/TDAP  05/21/2028  . Hepatitis C Screening  Completed  . HIV Screening  Completed  . HPV VACCINES  Aged Out     Depression screen [facility] 2/9 05/27/2020 12/13/2019 02/18/2019  Decreased Interest 0 0 0  Down, Depressed, Hopeless 0 0 0  PHQ - 2 Score 0 0 0  Altered sleeping - - -  Tired, decreased energy - - -  Change in appetite - - -  Feeling bad or failure about yourself  - - -  Trouble concentrating - - -  Moving slowly or fidgety/restless - - -  Suicidal thoughts - - -  PHQ-9 Score - - -  Difficult doing work/chores - - -    Fall Risk  05/27/2020 12/13/2019 09/13/2019 05/20/2019 02/18/2019  Falls in the past year? 0 0 0 0 0  Number falls in past yr: 0 0 0 0 0  Injury with Fall? 0 0 0 0 0  Follow up Falls evaluation completed Falls evaluation completed - Falls evaluation completed -     Allergies  Allergen Reactions  . Other Hives, Rash and Swelling  . Ibuprofen Other (See Comments)    Per patient feels like KNOT in throat and itching with rash  . Tramadol Anxiety    Prior to Admission medications    Medication Sig Start Date End Date Taking? Authorizing Provider  doxazosin (CARDURA) 8 MG tablet Take 1 tablet (8 mg total) by mouth daily. 12/13/19  Yes Lezlie Lye, Irma M, MD  fluticasone [facility]) 50 MCG/ACT nasal spray Place 1 spray into both nostrils every evening.    Yes [provider]  guanFACINE (INTUNIV) 1 MG TB24 ER tablet Take 1 mg by mouth daily. 09/11/19  Yes [provider]  JORNAY PM 80 MG CP24 Take 1 capsule by mouth at bedtime. 11/29/19  Yes [provider]  montelukast (SINGULAIR) 10 MG tablet TAKE 1 TABLET(10 MG) BY MOUTH AT BEDTIME 12/13/19  Yes Lezlie Lye, Irma M, MD  naproxen sodium (ALEVE) 220 MG tablet Take 440 mg by mouth 2 (two) times daily as needed (pain).    Yes [provider]    Past Medical History:  Diagnosis Date  . ADD (attention deficit disorder)   . Allergy   . Anxiety state, unspecified 03/10/2013  . Diverticulitis   . Insomnia   . Long-term current use of testosterone replacement therapy   . Sleep apnea     Past Surgical History:  Procedure Laterality Date  . HERNIA REPAIR N/A    [employer]reesia 05/24/2020  . INGUINAL HERNIA REPAIR Bilateral 01/22/2015  Procedure: LAPAROSCOPIC BILATERAL INGUINAL HERNIA REPAIR WITH MESH AND UMBILICAL HERNIA REPAIR;  Surgeon: Abigail Miyamoto, MD;  Location: [facility];  Service: General;  Laterality: Bilateral;  . QUADRICEPS TENDON REPAIR  08/2008  . UMBILICAL HERNIA REPAIR N/A 01/22/2015   Procedure: UMBILICAL HERNIA REPAIR;  Surgeon: Abigail Miyamoto, MD;  Location: [facility];  Service: General;  Laterality: N/A;    Social History   Tobacco Use  . Smoking status: Never Smoker  . Smokeless tobacco: Never Used  Substance Use Topics  . Alcohol use: Yes    Comment: rare     Family History  Problem Relation Age of Onset  . Hypertension Father   . Colon polyps Father   . Cancer Maternal Grandmother 67       breast cancer  . Hypertension  Paternal Grandmother   . COPD Paternal Grandfather        lung cancer; tobacco use, Doctor, general practice  . Cancer Maternal Aunt        breast cancer, late 34's  . Breast cancer Maternal Aunt   . Cancer Maternal Aunt        breast cancer; diagnosed late 21's  . Breast cancer Maternal Aunt   . Colon cancer Neg Hx   . Rectal cancer Neg Hx   . Stomach cancer Neg Hx   . Esophageal cancer Neg Hx     Review of Systems  Constitutional: Negative for chills, fever and malaise/fatigue.  Eyes: Negative for blurred vision and double vision.  Respiratory: Negative for cough, shortness of breath and wheezing.   Cardiovascular: Negative for chest pain, palpitations and leg swelling.  Gastrointestinal: Negative for abdominal pain, blood in stool, constipation, diarrhea, heartburn, nausea and vomiting.  Genitourinary: Negative for dysuria, frequency, hematuria and urgency.       Frequency at night  Musculoskeletal: Negative for back pain and joint pain.  Skin: Negative for rash.  Neurological: Negative for dizziness, weakness and headaches.     OBJECTIVE:  Today's Vitals   05/27/20 1607  BP: 136/83  Pulse: 87  Temp: (!) 97.2 F (36.2 C)  SpO2: 96%  Weight: 253 lb (114.8 kg)  Height: 5\' 10"  (1.778 m)   Body mass index is 36.3 kg/m.   Physical Exam Vitals reviewed.  Constitutional:      Appearance: Normal appearance.  HENT:     Head: Normocephalic and atraumatic.  Eyes:     Conjunctiva/sclera: Conjunctivae normal.     Pupils: Pupils are equal, round, and reactive to light.  Cardiovascular:     Rate and Rhythm: Normal rate and regular rhythm.     Pulses: Normal pulses.     Heart sounds: Normal heart sounds. No murmur heard. No friction rub. No gallop.   Pulmonary:     Effort: Pulmonary effort is normal. No respiratory distress.     Breath sounds: Normal breath sounds. No stridor. No wheezing or rales.  Abdominal:     General: Bowel sounds are normal.     Palpations: Abdomen  is soft.     Tenderness: There is no abdominal tenderness.  Musculoskeletal:     Right lower leg: No edema.     Left lower leg: No edema.  Skin:    General: Skin is warm and dry.  Neurological:     General: No focal deficit present.     Mental Status: He is alert and oriented to person, place, and time.  Psychiatric:        Mood  and Affect: Mood normal.        Behavior: Behavior normal.     No results found for this or any previous visit (from the past 24 hour(s)).  No results found.   ASSESSMENT and PLAN  Problem List Items Addressed This Visit   None   Visit Diagnoses    Seasonal allergies    -  Primary   Relevant Medications   montelukast (SINGULAIR) 10 MG tablet   fluticasone (FLONASE) 50 MCG/ACT nasal spray   BPH associated with nocturia       Relevant Medications   doxazosin (CARDURA) 8 MG tablet   Other Relevant Orders   Ambulatory referral to Urology      Plan . Medication refills sent . R/se/b of medications discussed   Return in about 6 months (around 11/27/2020).    Macario Carls Jeray Shugart, FNP-BC [facility] [address] [place], [place] [address] Ph.  [phone] Fax [phone]

## 2020-08-20 ENCOUNTER — Other Ambulatory Visit: Payer: Self-pay

## 2020-08-20 ENCOUNTER — Emergency Department (HOSPITAL_BASED_OUTPATIENT_CLINIC_OR_DEPARTMENT_OTHER): Payer: BC Managed Care – PPO

## 2020-08-20 ENCOUNTER — Encounter (HOSPITAL_BASED_OUTPATIENT_CLINIC_OR_DEPARTMENT_OTHER): Payer: Self-pay

## 2020-08-20 ENCOUNTER — Inpatient Hospital Stay (HOSPITAL_BASED_OUTPATIENT_CLINIC_OR_DEPARTMENT_OTHER)
Admission: EM | Admit: 2020-08-20 | Discharge: 2020-08-22 | DRG: 390 | Disposition: A | Discharge status: Home / Self Care | Payer: BC Managed Care – PPO | Attending: Internal Medicine | Admitting: Internal Medicine

## 2020-08-20 DIAGNOSIS — Z79899 Other long term (current) drug therapy: Secondary | ICD-10-CM | POA: Diagnosis not present

## 2020-08-20 DIAGNOSIS — Z8371 Family history of colonic polyps: Secondary | ICD-10-CM

## 2020-08-20 DIAGNOSIS — E6609 Other obesity due to excess calories: Secondary | ICD-10-CM | POA: Diagnosis present

## 2020-08-20 DIAGNOSIS — N179 Acute kidney failure, unspecified: Secondary | ICD-10-CM | POA: Diagnosis not present

## 2020-08-20 DIAGNOSIS — K56601 Complete intestinal obstruction, unspecified as to cause: Secondary | ICD-10-CM | POA: Diagnosis present

## 2020-08-20 DIAGNOSIS — Z886 Allergy status to analgesic agent status: Secondary | ICD-10-CM | POA: Diagnosis not present

## 2020-08-20 DIAGNOSIS — Z801 Family history of malignant neoplasm of trachea, bronchus and lung: Secondary | ICD-10-CM | POA: Diagnosis not present

## 2020-08-20 DIAGNOSIS — N289 Disorder of kidney and ureter, unspecified: Secondary | ICD-10-CM | POA: Diagnosis present

## 2020-08-20 DIAGNOSIS — N400 Enlarged prostate without lower urinary tract symptoms: Secondary | ICD-10-CM | POA: Diagnosis present

## 2020-08-20 DIAGNOSIS — D72829 Elevated white blood cell count, unspecified: Secondary | ICD-10-CM | POA: Diagnosis present

## 2020-08-20 DIAGNOSIS — Z885 Allergy status to narcotic agent status: Secondary | ICD-10-CM | POA: Diagnosis not present

## 2020-08-20 DIAGNOSIS — K567 Ileus, unspecified: Secondary | ICD-10-CM

## 2020-08-20 DIAGNOSIS — Z6835 Body mass index (BMI) 35.0-35.9, adult: Secondary | ICD-10-CM | POA: Diagnosis not present

## 2020-08-20 DIAGNOSIS — G4733 Obstructive sleep apnea (adult) (pediatric): Secondary | ICD-10-CM | POA: Diagnosis present

## 2020-08-20 DIAGNOSIS — Z8249 Family history of ischemic heart disease and other diseases of the circulatory system: Secondary | ICD-10-CM | POA: Diagnosis not present

## 2020-08-20 DIAGNOSIS — F902 Attention-deficit hyperactivity disorder, combined type: Secondary | ICD-10-CM | POA: Diagnosis not present

## 2020-08-20 DIAGNOSIS — Z825 Family history of asthma and other chronic lower respiratory diseases: Secondary | ICD-10-CM | POA: Diagnosis not present

## 2020-08-20 DIAGNOSIS — F988 Other specified behavioral and emotional disorders with onset usually occurring in childhood and adolescence: Secondary | ICD-10-CM | POA: Diagnosis present

## 2020-08-20 DIAGNOSIS — E860 Dehydration: Secondary | ICD-10-CM | POA: Diagnosis present

## 2020-08-20 DIAGNOSIS — K56609 Unspecified intestinal obstruction, unspecified as to partial versus complete obstruction: Secondary | ICD-10-CM | POA: Diagnosis present

## 2020-08-20 DIAGNOSIS — Z20822 Contact with and (suspected) exposure to covid-19: Secondary | ICD-10-CM | POA: Diagnosis present

## 2020-08-20 DIAGNOSIS — J302 Other seasonal allergic rhinitis: Secondary | ICD-10-CM

## 2020-08-20 DIAGNOSIS — Z803 Family history of malignant neoplasm of breast: Secondary | ICD-10-CM

## 2020-08-20 LAB — RESP PANEL BY RT-PCR (FLU A&B, COVID) ARPGX2
Influenza A by PCR: NEGATIVE
Influenza B by PCR: NEGATIVE
SARS Coronavirus 2 by RT PCR: NEGATIVE

## 2020-08-20 LAB — COMPREHENSIVE METABOLIC PANEL
ALT: 60 U/L — ABNORMAL HIGH (ref 0–44)
AST: 48 U/L — ABNORMAL HIGH (ref 15–41)
Albumin: 4.3 g/dL (ref 3.5–5.0)
Alkaline Phosphatase: 39 U/L (ref 38–126)
Anion gap: 9 (ref 5–15)
BUN: 16 mg/dL (ref 6–20)
CO2: 30 mmol/L (ref 22–32)
Calcium: 10 mg/dL (ref 8.9–10.3)
Chloride: 99 mmol/L (ref 98–111)
Creatinine, Ser: 1.45 mg/dL — ABNORMAL HIGH (ref 0.61–1.24)
GFR, Estimated: 59 mL/min — ABNORMAL LOW (ref >60)
Glucose, Bld: 128 mg/dL — ABNORMAL HIGH (ref 70–99)
Potassium: 4.4 mmol/L (ref 3.5–5.1)
Sodium: 138 mmol/L (ref 135–145)
Total Bilirubin: 0.8 mg/dL (ref 0.3–1.2)
Total Protein: 8.1 g/dL (ref 6.5–8.1)

## 2020-08-20 LAB — CBC WITH DIFFERENTIAL/PLATELET
Abs Immature Granulocytes: 0.05 10*3/uL (ref 0.00–0.07)
Basophils Absolute: 0.1 10*3/uL (ref 0.0–0.1)
Basophils Relative: 1 %
Eosinophils Absolute: 0.1 10*3/uL (ref 0.0–0.5)
Eosinophils Relative: 0 %
HCT: 56 % — ABNORMAL HIGH (ref 39.0–52.0)
Hemoglobin: 18.4 g/dL — ABNORMAL HIGH (ref 13.0–17.0)
Immature Granulocytes: 0 %
Lymphocytes Relative: 9 %
Lymphs Abs: 1.2 10*3/uL (ref 0.7–4.0)
MCH: 28.7 pg (ref 26.0–34.0)
MCHC: 32.9 g/dL (ref 30.0–36.0)
MCV: 87.4 fL (ref 80.0–100.0)
Monocytes Absolute: 1.1 10*3/uL — ABNORMAL HIGH (ref 0.1–1.0)
Monocytes Relative: 8 %
Neutro Abs: 11.5 10*3/uL — ABNORMAL HIGH (ref 1.7–7.7)
Neutrophils Relative %: 82 %
Platelets: 418 10*3/uL — ABNORMAL HIGH (ref 150–400)
RBC: 6.41 MIL/uL — ABNORMAL HIGH (ref 4.22–5.81)
RDW: 14.3 % (ref 11.5–15.5)
WBC: 14 10*3/uL — ABNORMAL HIGH (ref 4.0–10.5)
nRBC: 0 % (ref 0.0–0.2)

## 2020-08-20 LAB — URINALYSIS, ROUTINE W REFLEX MICROSCOPIC
Bilirubin Urine: NEGATIVE
Glucose, UA: NEGATIVE mg/dL
Ketones, ur: NEGATIVE mg/dL
Leukocytes,Ua: NEGATIVE
Nitrite: NEGATIVE
Protein, ur: 100 mg/dL — AB
Specific Gravity, Urine: 1.02 (ref 1.005–1.030)
pH: 6.5 (ref 5.0–8.0)

## 2020-08-20 LAB — URINALYSIS, MICROSCOPIC (REFLEX)

## 2020-08-20 LAB — HIV ANTIBODY (ROUTINE TESTING W REFLEX): HIV Screen 4th Generation wRfx: NONREACTIVE

## 2020-08-20 LAB — LIPASE, BLOOD: Lipase: 58 U/L — ABNORMAL HIGH (ref 11–51)

## 2020-08-20 MED ORDER — LACTATED RINGERS IV SOLN
INTRAVENOUS | Status: DC
  Administered 2020-08-20: 999 mL via INTRAVENOUS

## 2020-08-20 MED ORDER — SODIUM CHLORIDE 0.9 % IV SOLN
INTRAVENOUS | Status: DC
  Administered 2020-08-20: 125 mL/h via INTRAVENOUS

## 2020-08-20 MED ORDER — LIDOCAINE HCL URETHRAL/MUCOSAL 2 % EX GEL
1.0000 "application " | Freq: Once | CUTANEOUS | Status: AC
  Administered 2020-08-20: 1 via TOPICAL
  Filled 2020-08-20: qty 11

## 2020-08-20 MED ORDER — ALFUZOSIN HCL ER 10 MG PO TB24
10.0000 mg | ORAL_TABLET | Freq: Every day | ORAL | Status: DC
  Filled 2020-08-20: qty 1

## 2020-08-20 MED ORDER — DOXAZOSIN MESYLATE 8 MG PO TABS
8.0000 mg | ORAL_TABLET | Freq: Every day | ORAL | Status: DC
  Filled 2020-08-20: qty 1

## 2020-08-20 MED ORDER — DIATRIZOATE MEGLUMINE & SODIUM 66-10 % PO SOLN
90.0000 mL | Freq: Once | ORAL | Status: AC
  Administered 2020-08-20: 90 mL via NASOGASTRIC
  Filled 2020-08-20: qty 90

## 2020-08-20 MED ORDER — MONTELUKAST SODIUM 10 MG PO TABS
10.0000 mg | ORAL_TABLET | Freq: Every day | ORAL | Status: DC
  Administered 2020-08-21: 10 mg via ORAL
  Filled 2020-08-20: qty 1

## 2020-08-20 MED ORDER — ACETAMINOPHEN 325 MG PO TABS
650.0000 mg | ORAL_TABLET | Freq: Four times a day (QID) | ORAL | Status: DC | PRN
Start: 2020-08-20 — End: 2020-08-22

## 2020-08-20 MED ORDER — FLUTICASONE PROPIONATE 50 MCG/ACT NA SUSP
1.0000 | Freq: Every day | NASAL | Status: DC
  Filled 2020-08-20: qty 16

## 2020-08-20 MED ORDER — ONDANSETRON HCL 4 MG/2ML IJ SOLN
4.0000 mg | Freq: Four times a day (QID) | INTRAMUSCULAR | Status: DC | PRN
  Administered 2020-08-20: 4 mg via INTRAVENOUS
  Filled 2020-08-20: qty 2

## 2020-08-20 MED ORDER — MORPHINE SULFATE (PF) 4 MG/ML IV SOLN
4.0000 mg | Freq: Once | INTRAVENOUS | Status: AC
Start: 2020-08-20 — End: 2020-08-20
  Administered 2020-08-20: 4 mg via INTRAVENOUS
  Filled 2020-08-20: qty 1

## 2020-08-20 MED ORDER — MORPHINE SULFATE (PF) 2 MG/ML IV SOLN
2.0000 mg | INTRAVENOUS | Status: DC | PRN
  Administered 2020-08-20 – 2020-08-21 (×2): 2 mg via INTRAVENOUS
  Filled 2020-08-20 (×2): qty 1

## 2020-08-20 MED ORDER — ACETAMINOPHEN 650 MG RE SUPP: 650.0000 mg | Freq: Four times a day (QID) | RECTAL | Status: DC | PRN

## 2020-08-20 MED ORDER — DOXAZOSIN MESYLATE 8 MG PO TABS
8.0000 mg | ORAL_TABLET | Freq: Every day | ORAL | Status: DC
  Administered 2020-08-21 – 2020-08-22 (×2): 8 mg via ORAL
  Filled 2020-08-20 (×2): qty 1

## 2020-08-20 MED ORDER — ONDANSETRON HCL 4 MG/2ML IJ SOLN
4.0000 mg | Freq: Once | INTRAMUSCULAR | Status: AC
  Administered 2020-08-20: 4 mg via INTRAVENOUS
  Filled 2020-08-20: qty 2

## 2020-08-20 MED ORDER — MORPHINE SULFATE (PF) 4 MG/ML IV SOLN
4.0000 mg | INTRAVENOUS | Status: DC | PRN
  Administered 2020-08-20: 4 mg via INTRAVENOUS
  Filled 2020-08-20: qty 1

## 2020-08-20 MED ORDER — IOHEXOL 300 MG/ML  SOLN
75.0000 mL | Freq: Once | INTRAMUSCULAR | Status: AC | PRN
  Administered 2020-08-20: 75 mL via INTRAVENOUS

## 2020-08-20 MED ORDER — SODIUM CHLORIDE 0.9 % IV BOLUS
1000.0000 mL | Freq: Once | INTRAVENOUS | Status: AC
  Administered 2020-08-20: 1000 mL via INTRAVENOUS

## 2020-08-20 MED ORDER — HYDRALAZINE HCL 20 MG/ML IJ SOLN: 10.0000 mg | INTRAMUSCULAR | Status: DC | PRN

## 2020-08-20 MED ORDER — ALFUZOSIN HCL ER 10 MG PO TB24
10.0000 mg | ORAL_TABLET | Freq: Every day | ORAL | Status: DC
  Administered 2020-08-21 – 2020-08-22 (×2): 10 mg via ORAL
  Filled 2020-08-20 (×3): qty 1

## 2020-08-20 NOTE — ED Provider Notes (Signed)
[facility] EMERGENCY DEPARTMENT Provider Note   CSN: [id number] Arrival date & time: 08/20/20  0941     History Chief Complaint  Patient presents with   Abdominal Pain    Jose Shaffer is a 50 y.o. male past medical history of diverticulitis, small bowel obstruction who presents for evaluation of abdominal pain that began yesterday.  He states it is mostly in the left mid abdomen.  He states that comes intermittently and sometimes feels like it will ease off.  Currently reports the pain at a 3/10.  He has had dry heaves and nausea but no vomiting.  His last bowel movement was yesterday morning.  He thinks he has been passing a small amount of gas since then.  He states normally when he has small bowel obstruction, he cannot pass gas or urinate but states that he has been able to urinate without any difficulty.  No dysuria, hematuria.  He reports that last week, he did have some episodes where he was having more stools than normal.  No blood noted in stool.  He has not noted any fever.  He also reports that he has had about 2 months of reflux.  He states that happens after eat certain foods and goes up into his chest and throat.  He has not sought evaluation for this.  He has had a colonoscopy about 5 years ago from [facility].  He has not followed up with them since.  Denies any chest pain, difficulty breathing, urinary complaints.  The history is provided by the patient.      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/2019   SBO (small  bowel obstruction) (HCC) 09/10/2018   Small bowel obstruction (HCC) 09/10/2018   Abdominal pain, left lower quadrant 11/12/2014   Sleep apnea 04/15/2014   Snoring 04/15/2014   Insomnia 03/10/2013   Anxiety state 03/10/2013   ADD (attention deficit disorder) 09/11/2011    Past Surgical History:  Procedure Laterality Date   HERNIA REPAIR N/A    Phreesia 05/24/2020   INGUINAL HERNIA REPAIR Bilateral 01/22/2015   Procedure: LAPAROSCOPIC BILATERAL INGUINAL HERNIA REPAIR WITH MESH AND UMBILICAL HERNIA REPAIR;  Surgeon: Abigail Miyamoto, MD;  Location: [facility];  Service: General;  Laterality: Bilateral;   QUADRICEPS TENDON REPAIR  08/2008   UMBILICAL HERNIA REPAIR N/A 01/22/2015   Procedure: UMBILICAL HERNIA REPAIR;  Surgeon: Abigail Miyamoto, MD;  Location: [facility];  Service: General;  Laterality: N/A;       Family History  Problem Relation Age of Onset   Hypertension Father    Colon polyps Father    Cancer Maternal Grandmother 37       breast cancer   Hypertension Paternal Grandmother    COPD Paternal Grandfather        lung cancer; tobacco use, Doctor, general practice   Cancer Maternal Aunt        breast cancer, late 50's   Breast cancer Maternal Aunt    Cancer Maternal Aunt  breast cancer; diagnosed late 50's   Breast cancer Maternal Aunt    Colon cancer Neg Hx    Rectal cancer Neg Hx    Stomach cancer Neg Hx    Esophageal cancer Neg Hx     Social History   Tobacco Use   Smoking status: Never   Smokeless tobacco: Never  Vaping Use   Vaping Use: Never used  Substance Use Topics   Alcohol use: Yes    Comment: rare    Drug use: Yes    Types: Marijuana    Comment: States he recently quit    Home Medications Prior to Admission medications   Medication Sig Start Date End Date Taking? Authorizing Provider  doxazosin (CARDURA) 8 MG tablet Take 1 tablet (8 mg total) by mouth daily. 05/27/20   Just, Azalee CourseKelsea J, FNP  fluticasone  (FLONASE) 50 MCG/ACT nasal spray Place 1 spray into both nostrils every evening. 05/27/20   Just, Azalee CourseKelsea J, FNP  guanFACINE (INTUNIV) 1 MG TB24 ER tablet Take 1 mg by mouth daily. 09/11/19   [provider]  JORNAY PM 80 MG CP24 Take 1 capsule by mouth at bedtime. 11/29/19   [provider]  montelukast (SINGULAIR) 10 MG tablet TAKE 1 TABLET(10 MG) BY MOUTH AT BEDTIME 05/27/20   Just, Azalee CourseKelsea J, FNP  naproxen sodium (ALEVE) 220 MG tablet Take 440 mg by mouth 2 (two) times daily as needed (pain).     [provider]    Allergies    Other, Ibuprofen, and Tramadol  Review of Systems   Review of Systems  Constitutional:  Negative for fever.  Respiratory:  Negative for cough and shortness of breath.   Cardiovascular:  Negative for chest pain.  Gastrointestinal:  Positive for abdominal pain and nausea. Negative for blood in stool, diarrhea and vomiting.  Genitourinary:  Negative for dysuria and hematuria.  Neurological:  Negative for headaches.  All other systems reviewed and are negative.  Physical Exam Updated Vital Signs BP (!) 140/98 (BP Location: Left Arm)   Pulse 88   Temp 98.1 F (36.7 C) (Oral)   Resp 20   Ht 5\' 10"  (1.778 m)   Wt 113.4 kg   SpO2 96%   BMI 35.87 kg/m   Physical Exam Vitals and nursing note reviewed.  Constitutional:      Appearance: Normal appearance. He is well-developed.  HENT:     Head: Normocephalic and atraumatic.  Eyes:     General: Lids are normal.     Conjunctiva/sclera: Conjunctivae normal.     Pupils: Pupils are equal, round, and reactive to light.  Cardiovascular:     Rate and Rhythm: Normal rate and regular rhythm.     Pulses: Normal pulses.     Heart sounds: Normal heart sounds. No murmur heard.   No friction rub. No gallop.  Pulmonary:     Effort: Pulmonary effort is normal.     Breath sounds: Normal breath sounds.     Comments: Lungs clear to auscultation bilaterally.  Symmetric chest rise.  No wheezing, rales,  rhonchi.  Abdominal:     General: Bowel sounds are decreased.     Palpations: Abdomen is soft. Abdomen is not rigid.     Tenderness: There is abdominal tenderness. There is no right CVA tenderness, left CVA tenderness or guarding.     Comments: Hernia incision repair noted to the umbilicus.  No overlying warmth, erythema.  No active drainage.  Tenderness palpation in mid left abdomen.  No  rigidity, guarding.  No CVA tenderness noted bilaterally.  Musculoskeletal:        General: Normal range of motion.     Cervical back: Full passive range of motion without pain.  Skin:    General: Skin is warm and dry.     Capillary Refill: Capillary refill takes less than 2 seconds.  Neurological:     Mental Status: He is alert and oriented to person, place, and time.  Psychiatric:        Speech: Speech normal.    ED Results / Procedures / Treatments   Labs (all labs ordered are listed, but only abnormal results are displayed) Labs Reviewed  COMPREHENSIVE METABOLIC PANEL - Abnormal; Notable for the following components:      Result Value   Glucose, Bld 128 (*)    Creatinine, Ser 1.45 (*)    AST 48 (*)    ALT 60 (*)    GFR, Estimated 59 (*)    All other components within normal limits  CBC WITH DIFFERENTIAL/PLATELET - Abnormal; Notable for the following components:   WBC 14.0 (*)    RBC 6.41 (*)    Hemoglobin 18.4 (*)    HCT 56.0 (*)    Platelets 418 (*)    Neutro Abs 11.5 (*)    Monocytes Absolute 1.1 (*)    All other components within normal limits  LIPASE, BLOOD - Abnormal; Notable for the following components:   Lipase 58 (*)    All other components within normal limits  URINALYSIS, ROUTINE W REFLEX MICROSCOPIC - Abnormal; Notable for the following components:   Hgb urine dipstick TRACE (*)    Protein, ur 100 (*)    All other components within normal limits  URINALYSIS, MICROSCOPIC (REFLEX) - Abnormal; Notable for the following components:   Bacteria, UA RARE (*)    All other  components within normal limits  RESP PANEL BY RT-PCR (FLU A&B, COVID) ARPGX2    EKG None  Radiology CT Abdomen Pelvis W Contrast  Result Date: 08/20/2020 CLINICAL DATA:  Abdominal pain.  History of hernia repair EXAM: CT ABDOMEN AND PELVIS WITH CONTRAST TECHNIQUE: Multidetector CT imaging of the abdomen and pelvis was performed using the standard protocol following bolus administration of intravenous contrast. CONTRAST:  64mL OMNIPAQUE IOHEXOL 300 MG/ML  SOLN COMPARISON:  09/09/2018 FINDINGS: Lower chest: Included lung bases are clear. Heart size within normal limits. Hepatobiliary: No focal liver abnormality is seen. No gallstones, gallbladder wall thickening, or biliary dilatation. Pancreas: Unremarkable. No pancreatic ductal dilatation or surrounding inflammatory changes. Spleen: Normal in size without focal abnormality. Adrenals/Urinary Tract: Unremarkable adrenal glands. Kidneys enhance symmetrically without focal lesion, stone, or hydronephrosis. Ureters are nondilated. Mild circumferential urinary bladder wall thickening. Stomach/Bowel: Stomach is fluid distended. Numerous dilated loops of small bowel within the mid to lower abdomen measuring up to 4.0 cm in diameter. Transition to collapsed bowel anteriorly adjacent to the umbilicus (series 2, image 59). No pneumatosis. Distal small bowel and colon are decompressed. Extensive sigmoid diverticulosis. Normal appendix in the right lower quadrant. Vascular/Lymphatic: No significant vascular findings are present. No enlarged abdominal or pelvic lymph nodes. Reproductive: Prostate gland is mildly enlarged. Other: Trace free fluid within the pelvis, likely reactive. No organized abdominopelvic fluid collection or abscess. No pneumoperitoneum. Musculoskeletal: No acute or significant osseous findings. Nonspecific stranding within the soft tissues of the anterior proximal thighs bilaterally, unchanged from prior (series 2, image 96). IMPRESSION: 1.  Small-bowel obstruction with transition to collapsed bowel anteriorly adjacent to the  umbilicus, presumably secondary to adhesions from prior hernia surgery. No pneumatosis or pneumoperitoneum. 2. Extensive sigmoid diverticulosis without evidence of acute diverticulitis. 3. Mild circumferential urinary bladder wall thickening. Correlate with urinalysis to exclude cystitis. 4. Trace free fluid within the pelvis, likely reactive. No organized abdominopelvic fluid collection or abscess. Electronically Signed   By: Duanne Guess D.O.   On: 08/20/2020 11:27    Procedures Procedures   Medications Ordered in ED Medications  morphine 4 MG/ML injection 4 mg (4 mg Intravenous Given 08/20/20 1034)  ondansetron (ZOFRAN) injection 4 mg (4 mg Intravenous Given 08/20/20 1034)  sodium chloride 0.9 % bolus 1,000 mL (1,000 mLs Intravenous New Bag/Given 08/20/20 1059)  iohexol (OMNIPAQUE) 300 MG/ML solution 75 mL (75 mLs Intravenous Contrast Given 08/20/20 1041)    ED Course  I have reviewed the triage vital signs and the nursing notes.  Pertinent labs & imaging results that were available during my care of the patient were reviewed by me and considered in my medical decision making (see chart for details).    MDM Rules/Calculators/A&P                           50 year old male with past medical history of diverticulitis, small bowel obstruction who presents for abdominal pain that began yesterday.  Associated nausea, dry heaves.  Has not had any diarrhea.  Thinks he is still been passing a small amount of flatus.  No fevers.  No urinary complaints.  On initially arrival, he is afebrile, nontoxic-appearing.  Vital signs are stable.  On exam, he has previous hernia repair noted to the umbilicus.  No surrounding warmth, erythema.  No active drainage.  No tenderness.  He has tenderness into the left mid abdomen.  No CVA tenderness.  Consider infectious process such as diverticulitis.  Also consider small bowel  obstruction though he does have some atypical features.  History/physical exam not concerning for GU etiology.  Plan to check labs, CT scan.  Patient also complains of some reflux that has been ongoing for the last 2 months.  He has not sought evaluation for this.  We will most likely do trial of PPI and have him follow-up with GI.  UA shows trace hemoglobin.  No leukocytes.  There is small amount of bacteria.  Lipase slightly elevated at 58.  CMP shows BUN of 16, creatinine 1.45. This is slightly above his baseline. CBC shows slight leukocytosis of 14.0.  Hemoglobin is 18.4.  CT abd/pelvis shows small bowel obstruction with transition at the umbilicus. There is mild circumferential urinary bladder wall thickening as well.  Given his SBO, will consult surgery and hospitalist for admission. Patient updated on plan.   Discussed with Herbert Seta (Surgery RN). Dr. Fredricka Bonine is in surgery right now. Agrees with plan. Requests that medicine consult surgery when he arrives at hospital.   Discussed patient with Dr. Chipper Herb (hospitalist) who accepts patient for admission.   Portions of this note were generated with Scientist, clinical (histocompatibility and immunogenetics). Dictation errors may occur despite best attempts at proofreading.   Final Clinical Impression(s) / ED Diagnoses Final diagnoses:  Small bowel obstruction [facility])    Rx / DC Orders ED Discharge Orders     None        Maxwell Caul, PA-C 08/20/20 1231    Gwyneth Sprout, MD 08/21/20 1604

## 2020-08-20 NOTE — ED Notes (Signed)
NG Tube still having output with solid content  Yellow in color.

## 2020-08-20 NOTE — ED Triage Notes (Addendum)
Reports abdominal pain since yesterday.  Not able to eat or drink.  Reports nausea but denies vomiting or diarrhea.  Has history of diverticulitis and hernia repair.

## 2020-08-20 NOTE — H&P (Signed)
History and Physical    Jose Shaffer XNT:[id number] DOB: 08/21/1970 DOA: 08/20/2020  PCP: Patient, No Pcp Per (Inactive) Consultants:  None Patient coming from:  Home - lives with wife; HQP:RFFM, Lynn, [phone]   Chief Complaint: Abdominal pain  HPI: Jose Shaffer is a 50 y.o. male with medical history significant of ADD; BPH; and OSA presenting with abdominal pain.  He had umbilical hernia surgery about 6 years ago and he has had 4-5 episodes of SBO since.  It usually improves after 10-12 hours and this time it didn't.  It started bothering him about noon yesterday.  He has had 1 prior similar hospitalization, about 2-3 years ago.  He developed acute onset of pain in the umbilical region with severe bloating, "nothing would pass, even gas or urine."  +nausea, no vomiting.  He finally went to the ER about 0900 today.  NG tube placed and bloating and pain improved, suction of about 1 L. He has not passed flatulence but his pain has resolved.    ED Course: [facility] to [facility] transfer from Dr. Chipper Herb:  Recurrent SBO, NGT in, surgeon aware. Please inform surgeon when patient arrives.  Review of Systems: As per HPI; otherwise review of systems reviewed and negative.   Ambulatory Status:  Ambulates without assistance  COVID Vaccine Status:  Complete  Past Medical History:  Diagnosis Date   ADD (attention deficit disorder)    Allergy    Anxiety state, unspecified 03/10/2013   Diverticulitis    Insomnia    Long-term current use of testosterone replacement therapy    Sleep apnea     Past Surgical History:  Procedure Laterality Date   HERNIA REPAIR N/A    Phreesia 05/24/2020   INGUINAL HERNIA REPAIR Bilateral 01/22/2015   Procedure: LAPAROSCOPIC BILATERAL INGUINAL HERNIA REPAIR WITH MESH AND UMBILICAL HERNIA REPAIR;  Surgeon: Abigail Miyamoto, MD;  Location: [facility];  Service: General;  Laterality: Bilateral;   QUADRICEPS TENDON REPAIR  08/2008   UMBILICAL  HERNIA REPAIR N/A 01/22/2015   Procedure: UMBILICAL HERNIA REPAIR;  Surgeon: Abigail Miyamoto, MD;  Location: [facility];  Service: General;  Laterality: N/A;    Social History   Socioeconomic History   Marital status: Married    Spouse name: Jose Shaffer   Number of children: 0   Years of education: 12+   Highest education level: Not on file  Occupational History   Occupation: truck Air traffic controller: SELF-EMPLOYED   Occupation: Surveyor, minerals  Tobacco Use   Smoking status: Never   Smokeless tobacco: Never  Building services engineer Use: Never used  Substance and Sexual Activity   Alcohol use: Yes    Comment: rare    Drug use: Yes    Types: Marijuana    Comment: States he recently quit   Sexual activity: Yes    Partners: Female    Birth control/protection: None  Other Topics Concern   Not on file  Social History Narrative   Lives with wife, Jose Shaffer   Caffeine 1 cup avg twice weekly   Social Determinants of Health   Financial Resource Strain: Not on file  Food Insecurity: Not on file  Transportation Needs: Not on file  Physical Activity: Not on file  Stress: Not on file  Social Connections: Not on file  Intimate Partner Violence: Not on file    Allergies  Allergen Reactions   Other Hives, Rash and Swelling   Ibuprofen Other (See Comments)    Per patient  feels like KNOT in throat and itching with rash   Tramadol Anxiety    Family History  Problem Relation Age of Onset   Hypertension Father    Colon polyps Father    Cancer Maternal Grandmother 32       breast cancer   Hypertension Paternal Grandmother    COPD Paternal Grandfather        lung cancer; tobacco use, Doctor, general practice   Cancer Maternal Aunt        breast cancer, late 50's   Breast cancer Maternal Aunt    Cancer Maternal Aunt        breast cancer; diagnosed late 50's   Breast cancer Maternal Aunt    Colon cancer Neg Hx    Rectal cancer Neg Hx    Stomach cancer Neg Hx    Esophageal cancer  Neg Hx     Prior to Admission medications   Medication Sig Start Date End Date Taking? Authorizing Provider  doxazosin (CARDURA) 8 MG tablet Take 1 tablet (8 mg total) by mouth daily. 05/27/20   Just, Azalee Course, FNP  fluticasone (FLONASE) 50 MCG/ACT nasal spray Place 1 spray into both nostrils every evening. 05/27/20   Just, Azalee Course, FNP  guanFACINE (INTUNIV) 1 MG TB24 ER tablet Take 1 mg by mouth daily. 09/11/19   [provider]  JORNAY PM 80 MG CP24 Take 1 capsule by mouth at bedtime. 11/29/19   [provider]  montelukast (SINGULAIR) 10 MG tablet TAKE 1 TABLET(10 MG) BY MOUTH AT BEDTIME 05/27/20   Just, Azalee Course, FNP  naproxen sodium (ALEVE) 220 MG tablet Take 440 mg by mouth 2 (two) times daily as needed (pain).     [provider]    Physical Exam: Vitals:   08/20/20 1100 08/20/20 1200 08/20/20 1402 08/20/20 1526  BP: (!) 140/98 (!) 144/86 122/81 134/86  Pulse: 88 83 89 82  Resp: 20 18 18 16   Temp:    98 F (36.7 C)  TempSrc:    Oral  SpO2: 96% 98% 98% 98%  Weight:      Height:         General:  Appears calm and comfortable and is in NAD; NG tube in place Eyes:   EOMI, normal lids, iris ENT:  grossly normal hearing, lips & tongue, mmm; appropriate dentition; NG tube in place Neck:  no LAD, masses or thyromegaly Cardiovascular:  RRR, no m/r/g. No LE edema.  Respiratory:   CTA bilaterally with no wheezes/rales/rhonchi.  Normal respiratory effort. Abdomen:  soft, NT, mildly distended, hypoactive BS, very tympanic Skin:  no rash or induration seen on limited exam Musculoskeletal:  grossly normal tone BUE/BLE, good ROM, no bony abnormality Lower extremity:  No LE edema.  Limited foot exam with no ulcerations.  2+ distal pulses. Psychiatric:  grossly normal mood and affect, speech fluent and appropriate, AOx3 Neurologic:  CN 2-12 grossly intact, moves all extremities in coordinated fashion    Radiological Exams on Admission: Independently reviewed -  see discussion in A/P where applicable  DG Abdomen 1 View  Result Date: 08/20/2020 CLINICAL DATA:  Evaluate nasogastric tube placement. EXAM: ABDOMEN - 1 VIEW COMPARISON:  08/20/2020 FINDINGS: The nasogastric tube tip and side port are both below the level of the GE junction. The enteric tube is coiled within the fundus of the stomach. Dilated loops of small bowel are again noted compatible with previously demonstrated small bowel obstruction. Contrast material opacifies the bladder. IMPRESSION: Nasogastric tube tip  and side port are both below the level of the GE junction. Electronically Signed   By: Signa Kellaylor  Stroud M.D.   On: 08/20/2020 13:47   CT Abdomen Pelvis W Contrast  Result Date: 08/20/2020 CLINICAL DATA:  Abdominal pain.  History of hernia repair EXAM: CT ABDOMEN AND PELVIS WITH CONTRAST TECHNIQUE: Multidetector CT imaging of the abdomen and pelvis was performed using the standard protocol following bolus administration of intravenous contrast. CONTRAST:  75mL OMNIPAQUE IOHEXOL 300 MG/ML  SOLN COMPARISON:  09/09/2018 FINDINGS: Lower chest: Included lung bases are clear. Heart size within normal limits. Hepatobiliary: No focal liver abnormality is seen. No gallstones, gallbladder wall thickening, or biliary dilatation. Pancreas: Unremarkable. No pancreatic ductal dilatation or surrounding inflammatory changes. Spleen: Normal in size without focal abnormality. Adrenals/Urinary Tract: Unremarkable adrenal glands. Kidneys enhance symmetrically without focal lesion, stone, or hydronephrosis. Ureters are nondilated. Mild circumferential urinary bladder wall thickening. Stomach/Bowel: Stomach is fluid distended. Numerous dilated loops of small bowel within the mid to lower abdomen measuring up to 4.0 cm in diameter. Transition to collapsed bowel anteriorly adjacent to the umbilicus (series 2, image 59). No pneumatosis. Distal small bowel and colon are decompressed. Extensive sigmoid diverticulosis. Normal  appendix in the right lower quadrant. Vascular/Lymphatic: No significant vascular findings are present. No enlarged abdominal or pelvic lymph nodes. Reproductive: Prostate gland is mildly enlarged. Other: Trace free fluid within the pelvis, likely reactive. No organized abdominopelvic fluid collection or abscess. No pneumoperitoneum. Musculoskeletal: No acute or significant osseous findings. Nonspecific stranding within the soft tissues of the anterior proximal thighs bilaterally, unchanged from prior (series 2, image 96). IMPRESSION: 1. Small-bowel obstruction with transition to collapsed bowel anteriorly adjacent to the umbilicus, presumably secondary to adhesions from prior hernia surgery. No pneumatosis or pneumoperitoneum. 2. Extensive sigmoid diverticulosis without evidence of acute diverticulitis. 3. Mild circumferential urinary bladder wall thickening. Correlate with urinalysis to exclude cystitis. 4. Trace free fluid within the pelvis, likely reactive. No organized abdominopelvic fluid collection or abscess. Electronically Signed   By: Duanne GuessNicholas  Plundo D.O.   On: 08/20/2020 11:27    EKG: not done   Labs on Admission: I have personally reviewed the available labs and imaging studies at the time of the admission.  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 presenting with acute onset of abdominal pain with nausea, abdominal distention, and CT findings c/w SBO -Will admit to Med Surg -Gen Surg consulted; currently no indication for surgical intervention -80% of SBO  will resolve without surgery -High-grade SBO can usually be safely managed non-operatively -If PO contrast reaches colon within 24 hours, SBO will very likely certainly resolve without surgery -NPO for bowel rest -NG tube in place -IVF hydration -Pain control with morphine -Current guidelines recommend that patients without resolution undergo surgery by 3-5 days -Given recurrent issues, surgical intervention may be needed sooner  BPH -Continue Doxazosin, Uroxatral  ADD -Hold Guanfacine, Jornay  Obesity -Body mass index is 35.87 kg/m..  -Weight loss should be encouraged -Outpatient PCP/bariatric medicine f/u encouraged     Note: This patient has been  tested and is negative for the novel coronavirus COVID-19. The patient has been fully vaccinated against COVID-19.   Level of care: Med-Surg DVT prophylaxis:  SCDs Code Status:  Full - confirmed with patient Family Communication: None present Disposition Plan:  The patient is from: home  Anticipated d/c is to: home without [facility] services  Anticipated d/c date will depend on clinical response to treatment, likely 2-3 days  Patient is currently: acutely ill Consults called: Surgery  Admission status:  Admit - It is my clinical opinion that admission to INPATIENT is reasonable and necessary because of the expectation that this patient will require hospital care that crosses at least 2 midnights to treat this condition based on the medical complexity of the problems presented.  Given the aforementioned information, the predictability of an adverse outcome is felt to be significant.    Jonah Blue MD [facility]   How to contact the [facility] Attending or Consulting provider 7A - 7P or covering provider during after hours 7P -7A, for this patient?  Check the care team in [facility] and look for a) attending/consulting [facility] provider listed and b) the [facility] team listed Log into [email or web address] and use [place]'s universal password to  access. If you do not have the password, please contact the hospital operator. Locate the [facility] provider you are looking for under [facility] and page to a number that you can be directly reached. If you still have difficulty reaching the provider, please page the [facility] (Director on Call) for the Hospitalists listed on amion for assistance.   08/20/2020, 4:45 PM

## 2020-08-20 NOTE — Consult Note (Signed)
Consult Note  Jose Shaffer 1970-12-01  [id number].    Requesting MD: Dr. Gwyneth Sprout Chief Complaint/Reason for Consult: SBO  HPI:  Patient is a 50 year old male who presented to [facility] today with abdominal pain that started yesterday. Pain mostly in periumbilical, sharp and is intermittent. Associated nausea and dry heaves but no actual emesis. He reports last BM was yesterday AM and does not think he is passing flatus. Patient has a hx of recurrent SBO and usually can't pass flatus or urinate when this occurs. He denies issues with urination today. He reported that last week he did have an increase in frequency of BMs, but denies bloody stools. He has had worsening reflux over the last 2 months. He was seen by our practice for SBO about 2 years ago and it was recommended that he follow up for consideration of diagnostic laparoscopy. He appears to have followed up at [facility] and was scheduled for surgery but it was cancelled. This is the 4th episode that has brought him to the hospital but he has managed 3-4 at home on his own in the last 10 years.  PMH otherwise significant for diverticulosis, anxiety, ADD, insomnia, and low testosterone. Past abdominal surgery includes laparoscopic bilateral inguinal hernia and umbilical hernia repair (all done at the same time in 2016 by Dr. [name]). Allergies listed to ibuprofen and tramadol. Of note his first obstructive episode occurred prior to [facility].  He denies tobacco, alcohol use.   ROS:  Review of Systems  Constitutional:  Negative for chills and fever.  Respiratory:  Negative for shortness of breath and wheezing.   Cardiovascular:  Negative for chest pain and palpitations.  Gastrointestinal:  Positive for abdominal pain, diarrhea (1 week ago) and nausea. Negative for blood in stool and vomiting.  Genitourinary:  Negative for dysuria, frequency and urgency.  All other systems reviewed and are negative.  Family History  Problem  Relation Age of Onset   Hypertension Father    Colon polyps Father    Cancer Maternal Grandmother 108       breast cancer   Hypertension Paternal Grandmother    COPD Paternal Grandfather        lung cancer; tobacco use, Doctor, general practice   Cancer Maternal Aunt        breast cancer, late 50's   Breast cancer Maternal Aunt    Cancer Maternal Aunt        breast cancer; diagnosed late 50's   Breast cancer Maternal Aunt    Colon cancer Neg Hx    Rectal cancer Neg Hx    Stomach cancer Neg Hx    Esophageal cancer Neg Hx     Past Medical History:  Diagnosis Date   ADD (attention deficit disorder)    Allergy    Anxiety state, unspecified 03/10/2013   Diverticulitis    Insomnia    Long-term current use of testosterone replacement therapy    Sleep apnea     Past Surgical History:  Procedure Laterality Date   HERNIA REPAIR N/A    Phreesia 05/24/2020   INGUINAL HERNIA REPAIR Bilateral 01/22/2015   Procedure: LAPAROSCOPIC BILATERAL INGUINAL HERNIA REPAIR WITH MESH AND UMBILICAL HERNIA REPAIR;  Surgeon: Abigail Miyamoto, MD;  Location: [facility];  Service: General;  Laterality: Bilateral;   QUADRICEPS TENDON REPAIR  08/2008   UMBILICAL HERNIA REPAIR N/A 01/22/2015   Procedure: UMBILICAL HERNIA REPAIR;  Surgeon: Abigail Miyamoto, MD;  Location: [facility];  Service: General;  Laterality: N/A;    Social History:  reports that he has never smoked. He has never used smokeless tobacco. He reports current alcohol use. He reports current drug use. Drug: Marijuana.  Allergies:  Allergies  Allergen Reactions   Other Hives, Rash and Swelling   Ibuprofen Other (See Comments)    Per patient feels like KNOT in throat and itching with rash   Tramadol Anxiety    Medications Prior to Admission  Medication Sig Dispense Refill   alfuzosin (UROXATRAL) 10 MG 24 hr tablet Take 10 mg by mouth daily.     doxazosin (CARDURA) 8 MG tablet Take 1 tablet (8 mg total) by  mouth daily. 90 tablet 3   fluticasone (FLONASE) 50 MCG/ACT nasal spray Place 1 spray into both nostrils every evening. (Patient taking differently: Place 1 spray into both nostrils daily.) 11.1 mL 3   guanFACINE (INTUNIV) 1 MG TB24 ER tablet Take 1 mg by mouth daily.     montelukast (SINGULAIR) 10 MG tablet TAKE 1 TABLET(10 MG) BY MOUTH AT BEDTIME (Patient taking differently: Take 10 mg by mouth at bedtime.) 90 tablet 3   guanFACINE (INTUNIV) 2 MG TB24 ER tablet Take 2 mg by mouth daily.     JORNAY PM 60 MG CP24 Take 60 mg by mouth at bedtime.     JORNAY PM 80 MG CP24 Take 1 capsule by mouth at bedtime.     naproxen sodium (ALEVE) 220 MG tablet Take 440 mg by mouth 2 (two) times daily as needed (pain).       Blood pressure 134/86, pulse 82, temperature 98 F (36.7 C), temperature source Oral, resp. rate 16, height 5\' 10"  (1.778 m), weight 113.4 kg, SpO2 98 %. Physical Exam:  General: pleasant, WD, male who is laying in bed in NAD HEENT: head is normocephalic, atraumatic.  Sclera are noninjected.  Pupils equal and round.  Ears and nose without any masses or lesions.  Mouth is pink and moist Heart: regular, rate, and rhythm.  Normal s1,s2. No obvious murmurs, gallops, or rubs noted.  Palpable radial and pedal pulses bilaterally Lungs: CTAB, no wheezes, rhonchi, or rales noted.  Respiratory effort nonlabored Abd: soft, ND, +BS, no masses, or organomegaly. Minimally tender around umbilicus - no rebound or guarding. Visible suture at umbilicus with scant old blood MS: all 4 extremities are symmetrical with no cyanosis, clubbing, or edema. Skin: warm and dry with no masses, lesions, or rashes Neuro: Cranial nerves 2-12 grossly intact, sensation is normal throughout Psych: A&Ox3 with an appropriate affect.   Results for orders placed or performed during the hospital encounter of 08/20/20 (from the past 48 hour(s))  Comprehensive metabolic panel     Status: Abnormal   Collection Time: 08/20/20  10:37 AM  Result Value Ref Range   Sodium 138 135 - 145 mmol/L   Potassium 4.4 3.5 - 5.1 mmol/L   Chloride 99 98 - 111 mmol/L   CO2 30 22 - 32 mmol/L   Glucose, Bld 128 (H) 70 - 99 mg/dL    Comment: Glucose reference range applies only to samples taken after fasting for at least 8 hours.   BUN 16 6 - 20 mg/dL   Creatinine, Ser 1.61 (H) 0.61 - 1.24 mg/dL   Calcium 09.6 8.9 - 04.5 mg/dL   Total Protein 8.1 6.5 - 8.1 g/dL   Albumin 4.3 3.5 - 5.0 g/dL   AST 48 (H) 15 - 41 U/L   ALT 60 (H) 0 -  44 U/L   Alkaline Phosphatase 39 38 - 126 U/L   Total Bilirubin 0.8 0.3 - 1.2 mg/dL   GFR, Estimated 59 (L) >60 mL/min    Comment: (NOTE) Calculated using the CKD-EPI Creatinine Equation (2021)    Anion gap 9 5 - 15    Comment: Performed at [facility], [address] [place], [place] [address]  CBC with Differential     Status: Abnormal   Collection Time: 08/20/20 10:37 AM  Result Value Ref Range   WBC 14.0 (H) 4.0 - 10.5 K/uL   RBC 6.41 (H) 4.22 - 5.81 MIL/uL   Hemoglobin 18.4 (H) 13.0 - 17.0 g/dL   HCT 78.2 (H) 95.6 - 21.3 %   MCV 87.4 80.0 - 100.0 fL   MCH 28.7 26.0 - 34.0 pg   MCHC 32.9 30.0 - 36.0 g/dL   RDW 08.6 57.8 - 46.9 %   Platelets 418 (H) 150 - 400 K/uL   nRBC 0.0 0.0 - 0.2 %   Neutrophils Relative % 82 %   Neutro Abs 11.5 (H) 1.7 - 7.7 K/uL   Lymphocytes Relative 9 %   Lymphs Abs 1.2 0.7 - 4.0 K/uL   Monocytes Relative 8 %   Monocytes Absolute 1.1 (H) 0.1 - 1.0 K/uL   Eosinophils Relative 0 %   Eosinophils Absolute 0.1 0.0 - 0.5 K/uL   Basophils Relative 1 %   Basophils Absolute 0.1 0.0 - 0.1 K/uL   Immature Granulocytes 0 %   Abs Immature Granulocytes 0.05 0.00 - 0.07 K/uL    Comment: Performed at [facility], [address] [place], [place] [address]  Lipase, blood     Status: Abnormal   Collection Time: 08/20/20 10:37 AM  Result Value Ref Range   Lipase 58 (H) 11 - 51 U/L    Comment: Performed at [facility], [address] [place], [place] [address]  Urinalysis, Routine w reflex microscopic Urine, Clean Catch     Status: Abnormal   Collection Time: 08/20/20 10:37 AM  Result Value Ref Range   Color, Urine YELLOW YELLOW   APPearance CLEAR CLEAR   Specific Gravity, Urine 1.020 1.005 - 1.030   pH 6.5 5.0 - 8.0   Glucose, UA NEGATIVE NEGATIVE mg/dL   Hgb urine dipstick TRACE (A) NEGATIVE   Bilirubin Urine NEGATIVE NEGATIVE   Ketones, ur NEGATIVE NEGATIVE mg/dL   Protein, ur 440 (A) NEGATIVE mg/dL   Nitrite NEGATIVE NEGATIVE   Leukocytes,Ua NEGATIVE NEGATIVE    Comment: Performed at [facility], [address] [place], [place] [address]  Urinalysis, Microscopic (reflex)     Status: Abnormal   Collection Time: 08/20/20 10:37 AM  Result Value Ref Range   RBC / HPF 0-5 0 - 5 RBC/hpf   WBC, UA 0-5 0 - 5 WBC/hpf   Bacteria, UA RARE (A) NONE SEEN   Squamous Epithelial / LPF 0-5 0 - 5   Mucus PRESENT     Comment: Performed at [facility], [address] [place], [place] [address]  Resp Panel by RT-PCR (Flu A&B, Covid) Nasopharyngeal Swab     Status: None   Collection Time: 08/20/20 11:49 AM   Specimen: Nasopharyngeal Swab; Nasopharyngeal(NP) swabs in vial transport medium  Result Value Ref Range   SARS Coronavirus 2 by RT PCR NEGATIVE NEGATIVE    Comment: (NOTE) SARS-CoV-2 target nucleic acids are NOT DETECTED.  The SARS-CoV-2 RNA  is generally detectable in upper respiratory specimens during the acute phase of infection. The lowest concentration of SARS-CoV-2 viral copies this assay can detect is 138 copies/mL. A negative result does not preclude SARS-Cov-2 infection and should not be used as the sole basis for treatment or other patient management decisions. A negative result may occur with  improper specimen collection/handling, submission of specimen other than nasopharyngeal swab, presence of viral mutation(s) within the areas targeted by this assay, and inadequate number  of viral copies(<138 copies/mL). A negative result must be combined with clinical observations, patient history, and epidemiological information. The expected result is Negative.  Fact Sheet for Patients:  BloggerCourse.com  Fact Sheet for Healthcare Providers:  SeriousBroker.it  This test is no t yet approved or cleared by the [place] FDA and  has been authorized for detection and/or diagnosis of SARS-CoV-2 by FDA under an Emergency Use Authorization (EUA). This EUA will remain  in effect (meaning this test can be used) for the duration of the COVID-19 declaration under Section 564(b)(1) of the Act, 21 U.S.C.section 360bbb-3(b)(1), unless the authorization is terminated  or revoked sooner.       Influenza A by PCR NEGATIVE NEGATIVE   Influenza B by PCR NEGATIVE NEGATIVE    Comment: (NOTE) The Xpert Xpress SARS-CoV-2/FLU/RSV plus assay is intended as an aid in the diagnosis of influenza from Nasopharyngeal swab specimens and should not be used as a sole basis for treatment. Nasal washings and aspirates are unacceptable for Xpert Xpress SARS-CoV-2/FLU/RSV testing.  Fact Sheet for Patients: BloggerCourse.com  Fact Sheet for Healthcare Providers: SeriousBroker.it  This test is not yet approved or cleared by the [place] FDA and has been authorized for detection and/or diagnosis of SARS-CoV-2 by FDA under an Emergency Use Authorization (EUA). This EUA will remain in effect (meaning this test can be used) for the duration of the COVID-19 declaration under Section 564(b)(1) of the Act, 21 U.S.C. section 360bbb-3(b)(1), unless the authorization is terminated or revoked.  Performed at [facility], [address], [place], [place] [address]    DG Abdomen 1 View  Result Date: 08/20/2020 CLINICAL DATA:  Evaluate nasogastric tube placement. EXAM: ABDOMEN - 1  VIEW COMPARISON:  08/20/2020 FINDINGS: The nasogastric tube tip and side port are both below the level of the GE junction. The enteric tube is coiled within the fundus of the stomach. Dilated loops of small bowel are again noted compatible with previously demonstrated small bowel obstruction. Contrast material opacifies the bladder. IMPRESSION: Nasogastric tube tip and side port are both below the level of the GE junction. Electronically Signed   By: Signa Kell M.D.   On: 08/20/2020 13:47   CT Abdomen Pelvis W Contrast  Result Date: 08/20/2020 CLINICAL DATA:  Abdominal pain.  History of hernia repair EXAM: CT ABDOMEN AND PELVIS WITH CONTRAST TECHNIQUE: Multidetector CT imaging of the abdomen and pelvis was performed using the standard protocol following bolus administration of intravenous contrast. CONTRAST:  75mL OMNIPAQUE IOHEXOL 300 MG/ML  SOLN COMPARISON:  09/09/2018 FINDINGS: Lower chest: Included lung bases are clear. Heart size within normal limits. Hepatobiliary: No focal liver abnormality is seen. No gallstones, gallbladder wall thickening, or biliary dilatation. Pancreas: Unremarkable. No pancreatic ductal dilatation or surrounding inflammatory changes. Spleen: Normal in size without focal abnormality. Adrenals/Urinary Tract: Unremarkable adrenal glands. Kidneys enhance symmetrically without focal lesion, stone, or hydronephrosis. Ureters are nondilated. Mild circumferential urinary bladder wall thickening. Stomach/Bowel: Stomach is fluid distended. Numerous dilated loops of small  bowel within the mid to lower abdomen measuring up to 4.0 cm in diameter. Transition to collapsed bowel anteriorly adjacent to the umbilicus (series 2, image 59). No pneumatosis. Distal small bowel and colon are decompressed. Extensive sigmoid diverticulosis. Normal appendix in the right lower quadrant. Vascular/Lymphatic: No significant vascular findings are present. No enlarged abdominal or pelvic lymph nodes.  Reproductive: Prostate gland is mildly enlarged. Other: Trace free fluid within the pelvis, likely reactive. No organized abdominopelvic fluid collection or abscess. No pneumoperitoneum. Musculoskeletal: No acute or significant osseous findings. Nonspecific stranding within the soft tissues of the anterior proximal thighs bilaterally, unchanged from prior (series 2, image 96). IMPRESSION: 1. Small-bowel obstruction with transition to collapsed bowel anteriorly adjacent to the umbilicus, presumably secondary to adhesions from prior hernia surgery. No pneumatosis or pneumoperitoneum. 2. Extensive sigmoid diverticulosis without evidence of acute diverticulitis. 3. Mild circumferential urinary bladder wall thickening. Correlate with urinalysis to exclude cystitis. 4. Trace free fluid within the pelvis, likely reactive. No organized abdominopelvic fluid collection or abscess. Electronically Signed   By: Duanne Guess D.O.   On: 08/20/2020 11:27      Assessment/Plan Recurrent SBO - CT today with Small-bowel obstruction with transition to collapsed bowel anteriorly adjacent to the umbilicus - this is the same as previous scan in 2020 - NGT placed in ED - recommend SBO protocol - may still need to consider diagnostic laparoscopy given recurrent symptoms which was discussed with him today. He wants to discuss with wife and sleep on it.  FEN: NPO, IVF, NGT to LIWS VTE: ok for chemical prophylaxis from a surgical standpoint ID: no current abx  Carl Best, [facility] Surgery 08/20/2020, 3:58 PM Please see Amion for pager number during day hours 7:00am-4:30pm

## 2020-08-20 NOTE — ED Notes (Signed)
Patient transported to CT 

## 2020-08-20 NOTE — ED Notes (Signed)
Telephone report call to Wilhemina Cash, Charity fundraiser at [employer].  Pt going to room 6N22.

## 2020-08-20 NOTE — ED Notes (Signed)
ED Provider at bedside. 

## 2020-08-20 NOTE — ED Notes (Signed)
Telephone report called to [place] with [facility].

## 2020-08-21 ENCOUNTER — Inpatient Hospital Stay (HOSPITAL_COMMUNITY): Payer: BC Managed Care – PPO

## 2020-08-21 DIAGNOSIS — E860 Dehydration: Secondary | ICD-10-CM

## 2020-08-21 DIAGNOSIS — N400 Enlarged prostate without lower urinary tract symptoms: Secondary | ICD-10-CM

## 2020-08-21 LAB — BASIC METABOLIC PANEL
Anion gap: 9 (ref 5–15)
BUN: 14 mg/dL (ref 6–20)
CO2: 25 mmol/L (ref 22–32)
Calcium: 9.2 mg/dL (ref 8.9–10.3)
Chloride: 103 mmol/L (ref 98–111)
Creatinine, Ser: 1.32 mg/dL — ABNORMAL HIGH (ref 0.61–1.24)
GFR, Estimated: >60 mL/min (ref >60)
Glucose, Bld: 133 mg/dL — ABNORMAL HIGH (ref 70–99)
Potassium: 3.9 mmol/L (ref 3.5–5.1)
Sodium: 137 mmol/L (ref 135–145)

## 2020-08-21 LAB — CBC WITH DIFFERENTIAL/PLATELET
Abs Immature Granulocytes: 0.04 10*3/uL (ref 0.00–0.07)
Basophils Absolute: 0.1 10*3/uL (ref 0.0–0.1)
Basophils Relative: 0 %
Eosinophils Absolute: 0.1 10*3/uL (ref 0.0–0.5)
Eosinophils Relative: 1 %
HCT: 52.7 % — ABNORMAL HIGH (ref 39.0–52.0)
Hemoglobin: 17 g/dL (ref 13.0–17.0)
Immature Granulocytes: 0 %
Lymphocytes Relative: 9 %
Lymphs Abs: 1.2 10*3/uL (ref 0.7–4.0)
MCH: 28.5 pg (ref 26.0–34.0)
MCHC: 32.3 g/dL (ref 30.0–36.0)
MCV: 88.4 fL (ref 80.0–100.0)
Monocytes Absolute: 1 10*3/uL (ref 0.1–1.0)
Monocytes Relative: 8 %
Neutro Abs: 10.9 10*3/uL — ABNORMAL HIGH (ref 1.7–7.7)
Neutrophils Relative %: 82 %
Platelets: 378 10*3/uL (ref 150–400)
RBC: 5.96 MIL/uL — ABNORMAL HIGH (ref 4.22–5.81)
RDW: 14.2 % (ref 11.5–15.5)
WBC: 13.3 10*3/uL — ABNORMAL HIGH (ref 4.0–10.5)
nRBC: 0 % (ref 0.0–0.2)

## 2020-08-21 MED ORDER — SODIUM CHLORIDE 0.9 % IV BOLUS: 250.0000 mL | Freq: Once | INTRAVENOUS | Status: DC

## 2020-08-21 MED ORDER — SODIUM CHLORIDE 0.9 % IV BOLUS
500.0000 mL | Freq: Once | INTRAVENOUS | Status: AC
  Administered 2020-08-21: 500 mL via INTRAVENOUS

## 2020-08-21 MED ORDER — HYDROMORPHONE HCL 1 MG/ML IJ SOLN
1.0000 mg | INTRAMUSCULAR | Status: DC | PRN
  Administered 2020-08-21 (×2): 1 mg via INTRAVENOUS
  Filled 2020-08-21 (×2): qty 1

## 2020-08-21 MED ORDER — PHENOL 1.4 % MT LIQD: 1.0000 | OROMUCOSAL | Status: DC | PRN

## 2020-08-21 MED ORDER — METHOCARBAMOL 1000 MG/10ML IJ SOLN
500.0000 mg | Freq: Four times a day (QID) | INTRAVENOUS | Status: DC | PRN
  Filled 2020-08-21: qty 5

## 2020-08-21 MED ORDER — LORAZEPAM 2 MG/ML IJ SOLN
0.5000 mg | Freq: Four times a day (QID) | INTRAMUSCULAR | Status: DC | PRN
  Administered 2020-08-21: 0.5 mg via INTRAVENOUS
  Filled 2020-08-21: qty 1

## 2020-08-21 MED ORDER — LORAZEPAM 2 MG/ML IJ SOLN
0.5000 mg | Freq: Once | INTRAMUSCULAR | Status: AC | PRN
  Administered 2020-08-21: 0.5 mg via INTRAVENOUS
  Filled 2020-08-21: qty 1

## 2020-08-21 NOTE — Plan of Care (Signed)

## 2020-08-21 NOTE — Progress Notes (Signed)
Progress Note     Subjective: CC: He had emesis x1 overnight - not sure if this was due to morphine or underlying SBO. He'd had increasing abdominal pain and bloating requiring several doses of morphine leading up to emesis right after one of them. Pain and bloating are now improved to how he felt yesterday afternoon. Passing only small amount of flatus. Has not ambulated yet since admission. No nausea this am.   NG output 250 ml  Objective: Vital signs in last 24 hours: Temp:  [98 F (36.7 C)-98.4 F (36.9 C)] 98 F (36.7 C) (06/10 0503) Pulse Rate:  [82-100] 89 (06/10 0503) Resp:  [16-20] 20 (06/10 0503) BP: (122-159)/(73-98) 143/73 (06/10 0503) SpO2:  [92 %-100 %] 92 % (06/10 0503) Weight:  [113.4 kg] 113.4 kg (06/09 0953) Last BM Date: 08/19/20  Intake/Output from previous day: 06/09 0701 - 06/10 0700 In: 1771.7 [I.V.:771; IV Piggyback:1000.7] Out: 450 [Urine:200; Emesis/NG output:250] Intake/Output this shift: No intake/output data recorded.  PE: General: pleasant, WD, male who is laying in bed in NAD HEENT: head is normocephalic, atraumatic.  Sclera are noninjected.  Mouth is pink and moist Heart: regular, rate, and rhythm. Palpable radial and pedal pulses bilaterally Lungs: Respiratory effort nonlabored Abd: soft, ND, +BS, Minimally tender around umbilicus and bilateral lower quadrants - no rebound or guarding. Visible suture at umbilicus. NG canister with dark green output MS: all 4 extremities are symmetrical with no cyanosis, clubbing, or edema. Skin: warm and dry with no masses, lesions, or rashes Psych: A&Ox3 with an appropriate affect.    Lab Results:  Recent Labs    08/20/20 1037 08/21/20 0135  WBC 14.0* 13.3*  HGB 18.4* 17.0  HCT 56.0* 52.7*  PLT 418* 378   BMET Recent Labs    08/20/20 1037 08/21/20 0135  NA 138 137  K 4.4 3.9  CL 99 103  CO2 30 25  GLUCOSE 128* 133*  BUN 16 14  CREATININE 1.45* 1.32*  CALCIUM 10.0 9.2   PT/INR No  results for input(s): LABPROT, INR in the last 72 hours. CMP     Component Value Date/Time   NA 137 08/21/2020 0135   NA 139 05/15/2019 0812   K 3.9 08/21/2020 0135   CL 103 08/21/2020 0135   CO2 25 08/21/2020 0135   GLUCOSE 133 (H) 08/21/2020 0135   BUN 14 08/21/2020 0135   BUN 17 05/15/2019 0812   CREATININE 1.32 (H) 08/21/2020 0135   CREATININE 1.31 11/12/2014 1928   CALCIUM 9.2 08/21/2020 0135   PROT 8.1 08/20/2020 1037   PROT 6.6 05/15/2019 0812   ALBUMIN 4.3 08/20/2020 1037   ALBUMIN 4.4 05/15/2019 0812   AST 48 (H) 08/20/2020 1037   ALT 60 (H) 08/20/2020 1037   ALKPHOS 39 08/20/2020 1037   BILITOT 0.8 08/20/2020 1037   BILITOT 0.5 05/15/2019 0812   GFRNONAA >60 08/21/2020 0135   GFRAA 77 05/15/2019 0812   Lipase     Component Value Date/Time   LIPASE 58 (H) 08/20/2020 1037       Studies/Results: DG Abdomen 1 View  Result Date: 08/20/2020 CLINICAL DATA:  Evaluate nasogastric tube placement. EXAM: ABDOMEN - 1 VIEW COMPARISON:  08/20/2020 FINDINGS: The nasogastric tube tip and side port are both below the level of the GE junction. The enteric tube is coiled within the fundus of the stomach. Dilated loops of small bowel are again noted compatible with previously demonstrated small bowel obstruction. Contrast material opacifies the bladder. IMPRESSION: Nasogastric tube  tip and side port are both below the level of the GE junction. Electronically Signed   By: Signa Kell M.D.   On: 08/20/2020 13:47   CT Abdomen Pelvis W Contrast  Result Date: 08/20/2020 CLINICAL DATA:  Abdominal pain.  History of hernia repair EXAM: CT ABDOMEN AND PELVIS WITH CONTRAST TECHNIQUE: Multidetector CT imaging of the abdomen and pelvis was performed using the standard protocol following bolus administration of intravenous contrast. CONTRAST:  70mL OMNIPAQUE IOHEXOL 300 MG/ML  SOLN COMPARISON:  09/09/2018 FINDINGS: Lower chest: Included lung bases are clear. Heart size within normal limits.  Hepatobiliary: No focal liver abnormality is seen. No gallstones, gallbladder wall thickening, or biliary dilatation. Pancreas: Unremarkable. No pancreatic ductal dilatation or surrounding inflammatory changes. Spleen: Normal in size without focal abnormality. Adrenals/Urinary Tract: Unremarkable adrenal glands. Kidneys enhance symmetrically without focal lesion, stone, or hydronephrosis. Ureters are nondilated. Mild circumferential urinary bladder wall thickening. Stomach/Bowel: Stomach is fluid distended. Numerous dilated loops of small bowel within the mid to lower abdomen measuring up to 4.0 cm in diameter. Transition to collapsed bowel anteriorly adjacent to the umbilicus (series 2, image 59). No pneumatosis. Distal small bowel and colon are decompressed. Extensive sigmoid diverticulosis. Normal appendix in the right lower quadrant. Vascular/Lymphatic: No significant vascular findings are present. No enlarged abdominal or pelvic lymph nodes. Reproductive: Prostate gland is mildly enlarged. Other: Trace free fluid within the pelvis, likely reactive. No organized abdominopelvic fluid collection or abscess. No pneumoperitoneum. Musculoskeletal: No acute or significant osseous findings. Nonspecific stranding within the soft tissues of the anterior proximal thighs bilaterally, unchanged from prior (series 2, image 96). IMPRESSION: 1. Small-bowel obstruction with transition to collapsed bowel anteriorly adjacent to the umbilicus, presumably secondary to adhesions from prior hernia surgery. No pneumatosis or pneumoperitoneum. 2. Extensive sigmoid diverticulosis without evidence of acute diverticulitis. 3. Mild circumferential urinary bladder wall thickening. Correlate with urinalysis to exclude cystitis. 4. Trace free fluid within the pelvis, likely reactive. No organized abdominopelvic fluid collection or abscess. Electronically Signed   By: Duanne Guess D.O.   On: 08/20/2020 11:27   DG Abd Portable 1V-Small  Bowel Obstruction Protocol-initial, 8 hr delay  Result Date: 08/21/2020 CLINICAL DATA:  Small-bowel obstruction. 8 hour film following administration of a enteric contrast. EXAM: PORTABLE ABDOMEN - 1 VIEW COMPARISON:  None. FINDINGS: Nasogastric tube is seen within the gastric fundus. Enteric contrast remains within the stomach without antegrade passage. Multiple dilated loops of small bowel are again seen throughout the abdomen discordant with the degree of a gaseous distension of the colon in keeping with a distal small bowel obstruction. No free intraperitoneal gas. IMPRESSION: Distal small bowel obstruction. Administered enteric contrast remains only within the stomach. Electronically Signed   By: Helyn Numbers MD   On: 08/21/2020 02:49    Anti-infectives: Anti-infectives (From admission, onward)    None        Assessment/Plan Recurrent SBO - CT 6/9 with Small-bowel obstruction with transition to collapsed bowel anteriorly adjacent to the umbilicus - this is the same as previous scan in 2020 - NGT LIWS today - KUB this am with contrast only in stomach and distal SBO still evident - he is still hesitant to proceed with diagnostics laparoscopy and wants to walk and see how things go today. We discussed some small bowel obstructions resolve with bowel rest as his have in the past while sometimes surgery is needed for resolution of obstruction.   FEN: NPO, IVF, NGT to LIWS VTE: ok for chemical prophylaxis from  a surgical standpoint ID: no current abx    LOS: 1 day    Eric Form, [facility] Surgery 08/21/2020, 7:34 AM Please see Amion for pager number during day hours 7:00am-4:30pm

## 2020-08-21 NOTE — Progress Notes (Signed)
PROGRESS NOTE    Jose Shaffer   WUJ:[id number]  DOB: 04/12/1970  DOA: 08/20/2020 PCP: Patient, No Pcp Per (Inactive)   Brief Narrative:  Jose Shaffer is a 50 year old male with BPH, OSA, obesity and ADD who presented to the hospital with abdominal pain bloating and obstipation and was found to have a bowel obstruction.  NG tube was placed and about 1 L of fluid was removed.   Subjective: Is passing a small amount of gas.  Has soreness in the abdomen.  No other complaints.    Assessment & Plan:   Principal Problem:   SBO (small bowel obstruction), leukocytosis -Small bowel protocol reveals contrast to be only in the stomach with dilated small bowel loops and dilated colon. - Continue NG tube - General surgery recommending an ex lap but patient would like to wait  Active Problems: Dehydration -Creatinine is around 1- 1.2 at baseline-presented with a creatinine of 1.45 - The patient has had poor urine output today with dark concentrated urine - I have ordered a 500 cc fluid bolus and have increase his fluids from 75 to 125 cc an hour    ADD (attention deficit disorder) -Hold meds for now    BPH (benign prostatic hyperplasia) -Continue Cardura and alfuzosin-clamp tube for 30 minutes to an hour after giving oral medications    Class 2 obesity due to excess calories with body mass index (BMI) of 35.0 to 35.9 in adult Body mass index is 35.87 kg/m.    Time spent in minutes: 35 DVT prophylaxis: SCDs Start: 08/20/20 1626   Code Status: Full code Family Communication:  Level of Care: Level of care: Med-Surg Disposition Plan:  Status is: Inpatient  Remains inpatient appropriate because:IV treatments appropriate due to intensity of illness or inability to take PO and Inpatient level of care appropriate due to severity of illness  Dispo: The patient is from: Home              Anticipated d/c is to: Home              Patient currently is not medically stable to  d/c.   Difficult to place patient No      Consultants:  General surgery Procedures:  None Antimicrobials:  Anti-infectives (From admission, onward)    None        Objective: Vitals:   08/20/20 2048 08/21/20 0115 08/21/20 0503 08/21/20 1440  BP: (!) 139/93 (!) 137/92 (!) 143/73 140/80  Pulse: 88 97 89 93  Resp: 17  20 18   Temp: 98.4 F (36.9 C) 98.3 F (36.8 C) 98 F (36.7 C) (!) 97.5 F (36.4 C)  TempSrc: Oral Oral Oral Oral  SpO2: 96% 95% 92% 95%  Weight:      Height:        Intake/Output Summary (Last 24 hours) at 08/21/2020 1646 Last data filed at 08/21/2020 1256 Gross per 24 hour  Intake 771.03 ml  Output 451 ml  Net 320.03 ml   Filed Weights   08/20/20 0953  Weight: 113.4 kg    Examination: General exam: Appears comfortable  HEENT: PERRLA, oral mucosa moist, no sclera icterus or thrush-NG tube is in place and draining dark liquid Respiratory system: Clear to auscultation. Respiratory effort normal. Cardiovascular system: S1 & S2 heard, RRR.   Gastrointestinal system: Abdomen soft, non-tender, nondistended. Normal bowel sounds. Central nervous system: Alert and oriented. No focal neurological deficits. Extremities: No cyanosis, clubbing or edema Skin: No rashes or ulcers Psychiatry:  Mood &  affect appropriate.     Data Reviewed: I have personally reviewed following labs and imaging studies  CBC: Recent Labs  Lab 08/20/20 1037 08/21/20 0135  WBC 14.0* 13.3*  NEUTROABS 11.5* 10.9*  HGB 18.4* 17.0  HCT 56.0* 52.7*  MCV 87.4 88.4  PLT 418* 378   Basic Metabolic Panel: Recent Labs  Lab 08/20/20 1037 08/21/20 0135  NA 138 137  K 4.4 3.9  CL 99 103  CO2 30 25  GLUCOSE 128* 133*  BUN 16 14  CREATININE 1.45* 1.32*  CALCIUM 10.0 9.2   GFR: Estimated Creatinine Clearance: 84.5 mL/min (A) (by C-G formula based on SCr of 1.32 mg/dL (H)). Liver Function Tests: Recent Labs  Lab 08/20/20 1037  AST 48*  ALT 60*  ALKPHOS 39  BILITOT  0.8  PROT 8.1  ALBUMIN 4.3   Recent Labs  Lab 08/20/20 1037  LIPASE 58*   No results for input(s): AMMONIA in the last 168 hours. Coagulation Profile: No results for input(s): INR, PROTIME in the last 168 hours. Cardiac Enzymes: No results for input(s): CKTOTAL, CKMB, CKMBINDEX, TROPONINI in the last 168 hours. BNP (last 3 results) No results for input(s): PROBNP in the last 8760 hours. HbA1C: No results for input(s): HGBA1C in the last 72 hours. CBG: No results for input(s): GLUCAP in the last 168 hours. Lipid Profile: No results for input(s): CHOL, HDL, LDLCALC, TRIG, CHOLHDL, LDLDIRECT in the last 72 hours. Thyroid Function Tests: No results for input(s): TSH, T4TOTAL, FREET4, T3FREE, THYROIDAB in the last 72 hours. Anemia Panel: No results for input(s): VITAMINB12, FOLATE, FERRITIN, TIBC, IRON, RETICCTPCT in the last 72 hours. Urine analysis:    Component Value Date/Time   COLORURINE YELLOW 08/20/2020 1037   APPEARANCEUR CLEAR 08/20/2020 1037   LABSPEC 1.020 08/20/2020 1037   PHURINE 6.5 08/20/2020 1037   GLUCOSEU NEGATIVE 08/20/2020 1037   HGBUR TRACE (A) 08/20/2020 1037   BILIRUBINUR NEGATIVE 08/20/2020 1037   BILIRUBINUR negative 02/18/2019 1739   KETONESUR NEGATIVE 08/20/2020 1037   PROTEINUR 100 (A) 08/20/2020 1037   UROBILINOGEN 1.0 02/18/2019 1739   UROBILINOGEN 0.2 10/15/2014 0831   NITRITE NEGATIVE 08/20/2020 1037   LEUKOCYTESUR NEGATIVE 08/20/2020 1037   Sepsis Labs: @LABRCNTIP (procalcitonin:4,lacticidven:4) ) Recent Results (from the past 240 hour(s))  Resp Panel by RT-PCR (Flu A&B, Covid) Nasopharyngeal Swab     Status: None   Collection Time: 08/20/20 11:49 AM   Specimen: Nasopharyngeal Swab; Nasopharyngeal(NP) swabs in vial transport medium  Result Value Ref Range Status   SARS Coronavirus 2 by RT PCR NEGATIVE NEGATIVE Final    Comment: (NOTE) SARS-CoV-2 target nucleic acids are NOT DETECTED.  The SARS-CoV-2 RNA is generally detectable in  upper respiratory specimens during the acute phase of infection. The lowest concentration of SARS-CoV-2 viral copies this assay can detect is 138 copies/mL. A negative result does not preclude SARS-Cov-2 infection and should not be used as the sole basis for treatment or other patient management decisions. A negative result may occur with  improper specimen collection/handling, submission of specimen other than nasopharyngeal swab, presence of viral mutation(s) within the areas targeted by this assay, and inadequate number of viral copies(<138 copies/mL). A negative result must be combined with clinical observations, patient history, and epidemiological information. The expected result is Negative.  Fact Sheet for Patients:  10/20/20  Fact Sheet for Healthcare Providers:  BloggerCourse.com  This test is no t yet approved or cleared by the [employer] FDA and  has been authorized for detection and/or diagnosis  of SARS-CoV-2 by FDA under an Emergency Use Authorization (EUA). This EUA will remain  in effect (meaning this test can be used) for the duration of the COVID-19 declaration under Section 564(b)(1) of the Act, 21 U.S.C.section 360bbb-3(b)(1), unless the authorization is terminated  or revoked sooner.       Influenza A by PCR NEGATIVE NEGATIVE Final   Influenza B by PCR NEGATIVE NEGATIVE Final    Comment: (NOTE) The Xpert Xpress SARS-CoV-2/FLU/RSV plus assay is intended as an aid in the diagnosis of influenza from Nasopharyngeal swab specimens and should not be used as a sole basis for treatment. Nasal washings and aspirates are unacceptable for Xpert Xpress SARS-CoV-2/FLU/RSV testing.  Fact Sheet for Patients: BloggerCourse.com  Fact Sheet for Healthcare Providers: [employer]it  This test is not yet approved or cleared by the [place] FDA and has been  authorized for detection and/or diagnosis of SARS-CoV-2 by FDA under an Emergency Use Authorization (EUA). This EUA will remain in effect (meaning this test can be used) for the duration of the COVID-19 declaration under Section 564(b)(1) of the Act, 21 U.S.C. section 360bbb-3(b)(1), unless the authorization is terminated or revoked.  Performed at [facility], [address] [place], [place] [address]          Radiology Studies: DG Abdomen 1 View  Result Date: 08/20/2020 CLINICAL DATA:  Evaluate nasogastric tube placement. EXAM: ABDOMEN - 1 VIEW COMPARISON:  08/20/2020 FINDINGS: The nasogastric tube tip and side port are both below the level of the GE junction. The enteric tube is coiled within the fundus of the stomach. Dilated loops of small bowel are again noted compatible with previously demonstrated small bowel obstruction. Contrast material opacifies the bladder. IMPRESSION: Nasogastric tube tip and side port are both below the level of the GE junction. Electronically Signed   By: Signa Kell M.D.   On: 08/20/2020 13:47   CT Abdomen Pelvis W Contrast  Result Date: 08/20/2020 CLINICAL DATA:  Abdominal pain.  History of hernia repair EXAM: CT ABDOMEN AND PELVIS WITH CONTRAST TECHNIQUE: Multidetector CT imaging of the abdomen and pelvis was performed using the standard protocol following bolus administration of intravenous contrast. CONTRAST:  38mL OMNIPAQUE IOHEXOL 300 MG/ML  SOLN COMPARISON:  09/09/2018 FINDINGS: Lower chest: Included lung bases are clear. Heart size within normal limits. Hepatobiliary: No focal liver abnormality is seen. No gallstones, gallbladder wall thickening, or biliary dilatation. Pancreas: Unremarkable. No pancreatic ductal dilatation or surrounding inflammatory changes. Spleen: Normal in size without focal abnormality. Adrenals/Urinary Tract: Unremarkable adrenal glands. Kidneys enhance symmetrically without focal lesion, stone, or hydronephrosis.  Ureters are nondilated. Mild circumferential urinary bladder wall thickening. Stomach/Bowel: Stomach is fluid distended. Numerous dilated loops of small bowel within the mid to lower abdomen measuring up to 4.0 cm in diameter. Transition to collapsed bowel anteriorly adjacent to the umbilicus (series 2, image 59). No pneumatosis. Distal small bowel and colon are decompressed. Extensive sigmoid diverticulosis. Normal appendix in the right lower quadrant. Vascular/Lymphatic: No significant vascular findings are present. No enlarged abdominal or pelvic lymph nodes. Reproductive: Prostate gland is mildly enlarged. Other: Trace free fluid within the pelvis, likely reactive. No organized abdominopelvic fluid collection or abscess. No pneumoperitoneum. Musculoskeletal: No acute or significant osseous findings. Nonspecific stranding within the soft tissues of the anterior proximal thighs bilaterally, unchanged from prior (series 2, image 96). IMPRESSION: 1. Small-bowel obstruction with transition to collapsed bowel anteriorly adjacent to the umbilicus, presumably secondary to adhesions from prior hernia surgery. No pneumatosis  or pneumoperitoneum. 2. Extensive sigmoid diverticulosis without evidence of acute diverticulitis. 3. Mild circumferential urinary bladder wall thickening. Correlate with urinalysis to exclude cystitis. 4. Trace free fluid within the pelvis, likely reactive. No organized abdominopelvic fluid collection or abscess. Electronically Signed   By: Duanne Guess D.O.   On: 08/20/2020 11:27   DG Abd Portable 1V-Small Bowel Obstruction Protocol-initial, 8 hr delay  Result Date: 08/21/2020 CLINICAL DATA:  Small-bowel obstruction. 8 hour film following administration of a enteric contrast. EXAM: PORTABLE ABDOMEN - 1 VIEW COMPARISON:  None. FINDINGS: Nasogastric tube is seen within the gastric fundus. Enteric contrast remains within the stomach without antegrade passage. Multiple dilated loops of small  bowel are again seen throughout the abdomen discordant with the degree of a gaseous distension of the colon in keeping with a distal small bowel obstruction. No free intraperitoneal gas. IMPRESSION: Distal small bowel obstruction. Administered enteric contrast remains only within the stomach. Electronically Signed   By: Helyn Numbers MD   On: 08/21/2020 02:49      Scheduled Meds:  alfuzosin  10 mg Oral Daily   doxazosin  8 mg Oral Daily   fluticasone  1 spray Each Nare Daily   montelukast  10 mg Oral QHS   Continuous Infusions:  lactated ringers 125 mL/hr at 08/21/20 1105   methocarbamol (ROBAXIN) IV       LOS: 1 day      Calvert Cantor, MD [facility] Pager: [email or web address] 08/21/2020, 4:46 PM

## 2020-08-22 ENCOUNTER — Inpatient Hospital Stay (HOSPITAL_COMMUNITY): Payer: BC Managed Care – PPO

## 2020-08-22 DIAGNOSIS — E6609 Other obesity due to excess calories: Secondary | ICD-10-CM

## 2020-08-22 DIAGNOSIS — Z6835 Body mass index (BMI) 35.0-35.9, adult: Secondary | ICD-10-CM

## 2020-08-22 DIAGNOSIS — F902 Attention-deficit hyperactivity disorder, combined type: Secondary | ICD-10-CM

## 2020-08-22 DIAGNOSIS — N179 Acute kidney failure, unspecified: Secondary | ICD-10-CM

## 2020-08-22 LAB — BASIC METABOLIC PANEL
Anion gap: 9 (ref 5–15)
BUN: 11 mg/dL (ref 6–20)
CO2: 22 mmol/L (ref 22–32)
Calcium: 8.4 mg/dL — ABNORMAL LOW (ref 8.9–10.3)
Chloride: 104 mmol/L (ref 98–111)
Creatinine, Ser: 1.16 mg/dL (ref 0.61–1.24)
GFR, Estimated: >60 mL/min (ref >60)
Glucose, Bld: 92 mg/dL (ref 70–99)
Potassium: 3.8 mmol/L (ref 3.5–5.1)
Sodium: 135 mmol/L (ref 135–145)

## 2020-08-22 LAB — CBC
HCT: 46.3 % (ref 39.0–52.0)
Hemoglobin: 15 g/dL (ref 13.0–17.0)
MCH: 28.7 pg (ref 26.0–34.0)
MCHC: 32.4 g/dL (ref 30.0–36.0)
MCV: 88.7 fL (ref 80.0–100.0)
Platelets: 321 10*3/uL (ref 150–400)
RBC: 5.22 MIL/uL (ref 4.22–5.81)
RDW: 14.1 % (ref 11.5–15.5)
WBC: 10.9 10*3/uL — ABNORMAL HIGH (ref 4.0–10.5)
nRBC: 0 % (ref 0.0–0.2)

## 2020-08-22 NOTE — Discharge Summary (Addendum)
Physician Discharge Summary  Jose Shaffer YKD:[id number] DOB: 04/26/1970 DOA: 08/20/2020  PCP: Patient, No Pcp Per (Inactive)  Admit date: 08/20/2020 Discharge date: 08/22/2020  Admitted From: Home Disposition: Home    Home Health: None Discharge Condition: Stable CODE STATUS: Full code Diet recommendation: Heart healthy Consultations: General surgery Procedures/Studies: NG tube   Discharge Diagnoses:  Principal Problem:   SBO (small bowel obstruction) (HCC) Active Problems:   ADD (attention deficit disorder)   BPH (benign prostatic hyperplasia)   Class 2 obesity due to excess calories with body mass index (BMI) of 35.0 to 35.9 in adult     Brief Summary: Jose Shaffer is a 50 year old male with BPH, OSA, obesity and ADD who presented to the hospital with abdominal pain bloating and obstipation and was found to have a bowel obstruction.  NG tube was placed and about 1 L of fluid was removed.  Hospital Course:  Principal Problem:   SBO (small bowel obstruction), leukocytosis -Small bowel protocol on 6/10 revealed contrast to be only in the stomach with dilated small bowel loops and dilated colon. - General surgery recommending an ex lap but patient decided to wait - The patient began to have bowel movements yesterday and was started on full liquid diet - He continued to have bowel movements without abdominal pain and NG tube was removed today -diet was advanced to solid food and he has tolerated 2 meals today without any cramping nausea or vomiting-he has had another bowel movement today-stable to DC home   Active Problems: Dehydration-renal insufficiency -Creatinine is around 1- 1.2 at baseline-presented with a creatinine of 1.45 - 6/10> The patient has had poor urine output today with dark concentrated urine - 6/11> I have ordered a 500 cc fluid bolus and have increase his fluids from 75 to 125 cc an hour -Much more hydrated today-creatinine has improved to  1.16 - IV fluids stopped this morning as he was tolerating full liquids     ADD (attention deficit disorder) -Continue home meds     BPH (benign prostatic hyperplasia) -Continue alfuzosin-patient states he is no longer taking Cardura     Class 2 obesity due to excess calories with body mass index (BMI) of 35.0 to 35.9 in adult Body mass index is 35.87 kg/m.     Discharge Exam: Vitals:   08/21/20 2148 08/22/20 0501  BP: 138/81 137/90  Pulse: 90 89  Resp: 18 17  Temp: 98.3 F (36.8 C) 98.6 F (37 C)  SpO2: 96% 96%   Vitals:   08/21/20 0503 08/21/20 1440 08/21/20 2148 08/22/20 0501  BP: (!) 143/73 140/80 138/81 137/90  Pulse: 89 93 90 89  Resp: 20 18 18 17   Temp: 98 F (36.7 C) (!) 97.5 F (36.4 C) 98.3 F (36.8 C) 98.6 F (37 C)  TempSrc: Oral Oral Oral Oral  SpO2: 92% 95% 96% 96%  Weight:      Height:        General: Pt is alert, awake, not in acute distress Cardiovascular: RRR, S1/S2 +, no rubs, no gallops Respiratory: CTA bilaterally, no wheezing, no rhonchi Abdominal: Soft, NT, ND, bowel sounds + Extremities: no edema, no cyanosis   Discharge Instructions  Discharge Instructions     Diet - low sodium heart healthy   Complete by: As directed    Increase activity slowly   Complete by: As directed       Allergies as of 08/22/2020       Reactions   Other Hives, Rash, Swelling  Ibuprofen Other (See Comments)   Per patient feels like KNOT in throat and itching with rash   Tramadol Anxiety        Medication List     STOP taking these medications    doxazosin 8 MG tablet Commonly known as: CARDURA       TAKE these medications    alfuzosin 10 MG 24 hr tablet Commonly known as: UROXATRAL Take 10 mg by mouth daily.   fluticasone 50 MCG/ACT nasal spray Commonly known as: FLONASE Place 1 spray into both nostrils every evening. What changed: when to take this   guanFACINE 1 MG Tb24 ER tablet Commonly known as: INTUNIV Take 1 mg by  mouth daily.   Jornay PM 60 MG Cp24 Generic drug: Methylphenidate HCl ER (PM) Take 60 mg by mouth at bedtime.   montelukast 10 MG tablet Commonly known as: SINGULAIR TAKE 1 TABLET(10 MG) BY MOUTH AT BEDTIME What changed:  how much to take how to take this when to take this additional instructions        Allergies  Allergen Reactions   Other Hives, Rash and Swelling   Ibuprofen Other (See Comments)    Per patient feels like KNOT in throat and itching with rash   Tramadol Anxiety      DG Abdomen 1 View  Result Date: 08/20/2020 CLINICAL DATA:  Evaluate nasogastric tube placement. EXAM: ABDOMEN - 1 VIEW COMPARISON:  08/20/2020 FINDINGS: The nasogastric tube tip and side port are both below the level of the GE junction. The enteric tube is coiled within the fundus of the stomach. Dilated loops of small bowel are again noted compatible with previously demonstrated small bowel obstruction. Contrast material opacifies the bladder. IMPRESSION: Nasogastric tube tip and side port are both below the level of the GE junction. Electronically Signed   By: Signa Kellaylor  Stroud M.D.   On: 08/20/2020 13:47   CT Abdomen Pelvis W Contrast  Result Date: 08/20/2020 CLINICAL DATA:  Abdominal pain.  History of hernia repair EXAM: CT ABDOMEN AND PELVIS WITH CONTRAST TECHNIQUE: Multidetector CT imaging of the abdomen and pelvis was performed using the standard protocol following bolus administration of intravenous contrast. CONTRAST:  75mL OMNIPAQUE IOHEXOL 300 MG/ML  SOLN COMPARISON:  09/09/2018 FINDINGS: Lower chest: Included lung bases are clear. Heart size within normal limits. Hepatobiliary: No focal liver abnormality is seen. No gallstones, gallbladder wall thickening, or biliary dilatation. Pancreas: Unremarkable. No pancreatic ductal dilatation or surrounding inflammatory changes. Spleen: Normal in size without focal abnormality. Adrenals/Urinary Tract: Unremarkable adrenal glands. Kidneys enhance  symmetrically without focal lesion, stone, or hydronephrosis. Ureters are nondilated. Mild circumferential urinary bladder wall thickening. Stomach/Bowel: Stomach is fluid distended. Numerous dilated loops of small bowel within the mid to lower abdomen measuring up to 4.0 cm in diameter. Transition to collapsed bowel anteriorly adjacent to the umbilicus (series 2, image 59). No pneumatosis. Distal small bowel and colon are decompressed. Extensive sigmoid diverticulosis. Normal appendix in the right lower quadrant. Vascular/Lymphatic: No significant vascular findings are present. No enlarged abdominal or pelvic lymph nodes. Reproductive: Prostate gland is mildly enlarged. Other: Trace free fluid within the pelvis, likely reactive. No organized abdominopelvic fluid collection or abscess. No pneumoperitoneum. Musculoskeletal: No acute or significant osseous findings. Nonspecific stranding within the soft tissues of the anterior proximal thighs bilaterally, unchanged from prior (series 2, image 96). IMPRESSION: 1. Small-bowel obstruction with transition to collapsed bowel anteriorly adjacent to the umbilicus, presumably secondary to adhesions from prior hernia surgery. No pneumatosis or pneumoperitoneum.  2. Extensive sigmoid diverticulosis without evidence of acute diverticulitis. 3. Mild circumferential urinary bladder wall thickening. Correlate with urinalysis to exclude cystitis. 4. Trace free fluid within the pelvis, likely reactive. No organized abdominopelvic fluid collection or abscess. Electronically Signed   By: Duanne Guess D.O.   On: 08/20/2020 11:27   DG Abd Portable 1V  Result Date: 08/22/2020 CLINICAL DATA:  Follow-up of ileus EXAM: PORTABLE ABDOMEN - 1 VIEW COMPARISON:  1 day prior FINDINGS: 2 supine radiographs. Nasogastric tube terminates at the stomach. the enteric contrast administered on the prior exam is advanced to the colon, including the level of the rectum. Small bowel distension at up  to 4.0 cm versus 4.6 cm on the prior. No pneumatosis, free intraperitoneal air, or other acute complication. Mild right hemidiaphragm elevation. IMPRESSION: Slight decrease in small bowel distension with contrast passage to the level of the rectum. Findings favor improving low-grade partial small bowel obstruction versus ileus. No high-grade obstruction, given contrast passage. Electronically Signed   By: Jeronimo Greaves M.D.   On: 08/22/2020 10:14   DG Abd Portable 1V-Small Bowel Obstruction Protocol-initial, 8 hr delay  Result Date: 08/21/2020 CLINICAL DATA:  Small-bowel obstruction. 8 hour film following administration of a enteric contrast. EXAM: PORTABLE ABDOMEN - 1 VIEW COMPARISON:  None. FINDINGS: Nasogastric tube is seen within the gastric fundus. Enteric contrast remains within the stomach without antegrade passage. Multiple dilated loops of small bowel are again seen throughout the abdomen discordant with the degree of a gaseous distension of the colon in keeping with a distal small bowel obstruction. No free intraperitoneal gas. IMPRESSION: Distal small bowel obstruction. Administered enteric contrast remains only within the stomach. Electronically Signed   By: Helyn Numbers MD   On: 08/21/2020 02:49     The results of significant diagnostics from this hospitalization (including imaging, microbiology, ancillary and laboratory) are listed below for reference.     Microbiology: Recent Results (from the past 240 hour(s))  Resp Panel by RT-PCR (Flu A&B, Covid) Nasopharyngeal Swab     Status: None   Collection Time: 08/20/20 11:49 AM   Specimen: Nasopharyngeal Swab; Nasopharyngeal(NP) swabs in vial transport medium  Result Value Ref Range Status   SARS Coronavirus 2 by RT PCR NEGATIVE NEGATIVE Final    Comment: (NOTE) SARS-CoV-2 target nucleic acids are NOT DETECTED.  The SARS-CoV-2 RNA is generally detectable in upper respiratory specimens during the acute phase of infection. The  lowest concentration of SARS-CoV-2 viral copies this assay can detect is 138 copies/mL. A negative result does not preclude SARS-Cov-2 infection and should not be used as the sole basis for treatment or other patient management decisions. A negative result may occur with  improper specimen collection/handling, submission of specimen other than nasopharyngeal swab, presence of viral mutation(s) within the areas targeted by this assay, and inadequate number of viral copies(<138 copies/mL). A negative result must be combined with clinical observations, patient history, and epidemiological information. The expected result is Negative.  Fact Sheet for Patients:  BloggerCourse.com  Fact Sheet for Healthcare Providers:  SeriousBroker.it  This test is no t yet approved or cleared by the [place] FDA and  has been authorized for detection and/or diagnosis of SARS-CoV-2 by FDA under an Emergency Use Authorization (EUA). This EUA will remain  in effect (meaning this test can be used) for the duration of the COVID-19 declaration under Section 564(b)(1) of the Act, 21 U.S.C.section 360bbb-3(b)(1), unless the authorization is terminated  or revoked sooner.  Influenza A by PCR NEGATIVE NEGATIVE Final   Influenza B by PCR NEGATIVE NEGATIVE Final    Comment: (NOTE) The Xpert Xpress SARS-CoV-2/FLU/RSV plus assay is intended as an aid in the diagnosis of influenza from Nasopharyngeal swab specimens and should not be used as a sole basis for treatment. Nasal washings and aspirates are unacceptable for Xpert Xpress SARS-CoV-2/FLU/RSV testing.  Fact Sheet for Patients: BloggerCourse.com  Fact Sheet for Healthcare Providers: SeriousBroker.it  This test is not yet approved or cleared by the [place] FDA and has been authorized for detection and/or diagnosis of SARS-CoV-2 by FDA under  an Emergency Use Authorization (EUA). This EUA will remain in effect (meaning this test can be used) for the duration of the COVID-19 declaration under Section 564(b)(1) of the Act, 21 U.S.C. section 360bbb-3(b)(1), unless the authorization is terminated or revoked.  Performed at [facility], [address] [place], [place] [address]      Labs: BNP (last 3 results) No results for input(s): BNP in the last 8760 hours. Basic Metabolic Panel: Recent Labs  Lab 08/20/20 1037 08/21/20 0135 08/22/20 0104  NA 138 137 135  K 4.4 3.9 3.8  CL 99 103 104  CO2 GLUCOSE 128* 133* 92  BUN CREATININE 1.45* 1.32* 1.16  CALCIUM 10.0 9.2 8.4*   Liver Function Tests: Recent Labs  Lab 08/20/20 1037  AST 48*  ALT 60*  ALKPHOS 39  BILITOT 0.8  PROT 8.1  ALBUMIN 4.3   Recent Labs  Lab 08/20/20 1037  LIPASE 58*   No results for input(s): AMMONIA in the last 168 hours. CBC: Recent Labs  Lab 08/20/20 1037 08/21/20 0135 08/22/20 0104  WBC 14.0* 13.3* 10.9*  NEUTROABS 11.5* 10.9*  --   HGB 18.4* 17.0 15.0  HCT 56.0* 52.7* 46.3  MCV 87.4 88.4 88.7  PLT 418* 378 321   Cardiac Enzymes: No results for input(s): CKTOTAL, CKMB, CKMBINDEX, TROPONINI in the last 168 hours. BNP: Invalid input(s): POCBNP CBG: No results for input(s): GLUCAP in the last 168 hours. D-Dimer No results for input(s): DDIMER in the last 72 hours. Hgb A1c No results for input(s): HGBA1C in the last 72 hours. Lipid Profile No results for input(s): CHOL, HDL, LDLCALC, TRIG, CHOLHDL, LDLDIRECT in the last 72 hours. Thyroid function studies No results for input(s): TSH, T4TOTAL, T3FREE, THYROIDAB in the last 72 hours.  Invalid input(s): FREET3 Anemia work up No results for input(s): VITAMINB12, FOLATE, FERRITIN, TIBC, IRON, RETICCTPCT in the last 72 hours. Urinalysis    Component Value Date/Time   COLORURINE YELLOW 08/20/2020 1037   APPEARANCEUR CLEAR 08/20/2020 1037    LABSPEC 1.020 08/20/2020 1037   PHURINE 6.5 08/20/2020 1037   GLUCOSEU NEGATIVE 08/20/2020 1037   HGBUR TRACE (A) 08/20/2020 1037   BILIRUBINUR NEGATIVE 08/20/2020 1037   BILIRUBINUR negative 02/18/2019 1739   KETONESUR NEGATIVE 08/20/2020 1037   PROTEINUR 100 (A) 08/20/2020 1037   UROBILINOGEN 1.0 02/18/2019 1739   UROBILINOGEN 0.2 10/15/2014 0831   NITRITE NEGATIVE 08/20/2020 1037   LEUKOCYTESUR NEGATIVE 08/20/2020 1037   Sepsis Labs Invalid input(s): PROCALCITONIN,  WBC,  LACTICIDVEN Microbiology Recent Results (from the past 240 hour(s))  Resp Panel by RT-PCR (Flu A&B, Covid) Nasopharyngeal Swab     Status: None   Collection Time: 08/20/20 11:49 AM   Specimen: Nasopharyngeal Swab; Nasopharyngeal(NP) swabs in vial transport medium  Result Value Ref Range Status   SARS Coronavirus 2 by RT PCR NEGATIVE  NEGATIVE Final    Comment: (NOTE) SARS-CoV-2 target nucleic acids are NOT DETECTED.  The SARS-CoV-2 RNA is generally detectable in upper respiratory specimens during the acute phase of infection. The lowest concentration of SARS-CoV-2 viral copies this assay can detect is 138 copies/mL. A negative result does not preclude SARS-Cov-2 infection and should not be used as the sole basis for treatment or other patient management decisions. A negative result may occur with  improper specimen collection/handling, submission of specimen other than nasopharyngeal swab, presence of viral mutation(s) within the areas targeted by this assay, and inadequate number of viral copies(<138 copies/mL). A negative result must be combined with clinical observations, patient history, and epidemiological information. The expected result is Negative.  Fact Sheet for Patients:  BloggerCourse.com  Fact Sheet for Healthcare Providers:  SeriousBroker.it  This test is no t yet approved or cleared by the [place] FDA and  has been authorized for  detection and/or diagnosis of SARS-CoV-2 by FDA under an Emergency Use Authorization (EUA). This EUA will remain  in effect (meaning this test can be used) for the duration of the COVID-19 declaration under Section 564(b)(1) of the Act, 21 U.S.C.section 360bbb-3(b)(1), unless the authorization is terminated  or revoked sooner.       Influenza A by PCR NEGATIVE NEGATIVE Final   Influenza B by PCR NEGATIVE NEGATIVE Final    Comment: (NOTE) The Xpert Xpress SARS-CoV-2/FLU/RSV plus assay is intended as an aid in the diagnosis of influenza from Nasopharyngeal swab specimens and should not be used as a sole basis for treatment. Nasal washings and aspirates are unacceptable for Xpert Xpress SARS-CoV-2/FLU/RSV testing.  Fact Sheet for Patients: BloggerCourse.com  Fact Sheet for Healthcare Providers: SeriousBroker.it  This test is not yet approved or cleared by the [place] FDA and has been authorized for detection and/or diagnosis of SARS-CoV-2 by FDA under an Emergency Use Authorization (EUA). This EUA will remain in effect (meaning this test can be used) for the duration of the COVID-19 declaration under Section 564(b)(1) of the Act, 21 U.S.C. section 360bbb-3(b)(1), unless the authorization is terminated or revoked.  Performed at [facility], [address], [place], [place] [address]      Time coordinating discharge in minutes: 65  SIGNED:   Calvert Cantor, MD  [facility] 08/22/2020, 2:49 PM

## 2020-08-22 NOTE — Plan of Care (Signed)

## 2020-08-22 NOTE — Progress Notes (Signed)
Patient's diet advanced to Regular diet this morning. Patient states he had grits and cream of chicken last night for dinner and tolerated well. Will monitor and check back in once he has had his breakfast this morning. NGT still clamped since yesterday 1120.  No complains of nausea or vomiting since. Had a couple Bms on my shift yesterday. Patient has been up and walking around in his room. No assistance needed. Overall patient looks in better condition than yesterday. Will continue to monitor.

## 2020-08-22 NOTE — Progress Notes (Signed)
Progress Note     Subjective: CC: Pain and bloating are now improved. Passing flatus and having BM's. Ambulating and tolerating a diet. No nausea this am.    Objective: Vital signs in last 24 hours: Temp:  [97.5 F (36.4 C)-98.6 F (37 C)] 98.6 F (37 C) (06/11 0501) Pulse Rate:  [89-93] 89 (06/11 0501) Resp:  [17-18] 17 (06/11 0501) BP: (137-140)/(80-90) 137/90 (06/11 0501) SpO2:  [95 %-96 %] 96 % (06/11 0501) Last BM Date: 08/21/20  Intake/Output from previous day: 06/10 0701 - 06/11 0700 In: 2896.6 [I.V.:2896.6] Out: 501 [Urine:500; Stool:1] Intake/Output this shift: Total I/O In: -  Out: 300 [Urine:300]  PE: General: pleasant, WD, male who is laying in bed in NAD Abd: soft, mild distension.   - no rebound or guarding.  MS: all 4 extremities are symmetrical with no cyanosis, clubbing, or edema. Skin: warm and dry with no masses, lesions, or rashes Psych: A&Ox3 with an appropriate affect.    Lab Results:  Recent Labs    08/21/20 0135 08/22/20 0104  WBC 13.3* 10.9*  HGB 17.0 15.0  HCT 52.7* 46.3  PLT 378 321    BMET Recent Labs    08/21/20 0135 08/22/20 0104  NA 137 135  K 3.9 3.8  CL 103 104  CO2 25 22  GLUCOSE 133* 92  BUN 14 11  CREATININE 1.32* 1.16  CALCIUM 9.2 8.4*    PT/INR No results for input(s): LABPROT, INR in the last 72 hours. CMP     Component Value Date/Time   NA 135 08/22/2020 0104   NA 139 05/15/2019 0812   K 3.8 08/22/2020 0104   CL 104 08/22/2020 0104   CO2 22 08/22/2020 0104   GLUCOSE 92 08/22/2020 0104   BUN 11 08/22/2020 0104   BUN 17 05/15/2019 0812   CREATININE 1.16 08/22/2020 0104   CREATININE 1.31 11/12/2014 1928   CALCIUM 8.4 (L) 08/22/2020 0104   PROT 8.1 08/20/2020 1037   PROT 6.6 05/15/2019 0812   ALBUMIN 4.3 08/20/2020 1037   ALBUMIN 4.4 05/15/2019 0812   AST 48 (H) 08/20/2020 1037   ALT 60 (H) 08/20/2020 1037   ALKPHOS 39 08/20/2020 1037   BILITOT 0.8 08/20/2020 1037   BILITOT 0.5 05/15/2019  0812   GFRNONAA >60 08/22/2020 0104   GFRAA 77 05/15/2019 0812   Lipase     Component Value Date/Time   LIPASE 58 (H) 08/20/2020 1037       Studies/Results: DG Abdomen 1 View  Result Date: 08/20/2020 CLINICAL DATA:  Evaluate nasogastric tube placement. EXAM: ABDOMEN - 1 VIEW COMPARISON:  08/20/2020 FINDINGS: The nasogastric tube tip and side port are both below the level of the GE junction. The enteric tube is coiled within the fundus of the stomach. Dilated loops of small bowel are again noted compatible with previously demonstrated small bowel obstruction. Contrast material opacifies the bladder. IMPRESSION: Nasogastric tube tip and side port are both below the level of the GE junction. Electronically Signed   By: Signa Kell M.D.   On: 08/20/2020 13:47   CT Abdomen Pelvis W Contrast  Result Date: 08/20/2020 CLINICAL DATA:  Abdominal pain.  History of hernia repair EXAM: CT ABDOMEN AND PELVIS WITH CONTRAST TECHNIQUE: Multidetector CT imaging of the abdomen and pelvis was performed using the standard protocol following bolus administration of intravenous contrast. CONTRAST:  57mL OMNIPAQUE IOHEXOL 300 MG/ML  SOLN COMPARISON:  09/09/2018 FINDINGS: Lower chest: Included lung bases are clear. Heart size within normal limits.  Hepatobiliary: No focal liver abnormality is seen. No gallstones, gallbladder wall thickening, or biliary dilatation. Pancreas: Unremarkable. No pancreatic ductal dilatation or surrounding inflammatory changes. Spleen: Normal in size without focal abnormality. Adrenals/Urinary Tract: Unremarkable adrenal glands. Kidneys enhance symmetrically without focal lesion, stone, or hydronephrosis. Ureters are nondilated. Mild circumferential urinary bladder wall thickening. Stomach/Bowel: Stomach is fluid distended. Numerous dilated loops of small bowel within the mid to lower abdomen measuring up to 4.0 cm in diameter. Transition to collapsed bowel anteriorly adjacent to the umbilicus  (series 2, image 59). No pneumatosis. Distal small bowel and colon are decompressed. Extensive sigmoid diverticulosis. Normal appendix in the right lower quadrant. Vascular/Lymphatic: No significant vascular findings are present. No enlarged abdominal or pelvic lymph nodes. Reproductive: Prostate gland is mildly enlarged. Other: Trace free fluid within the pelvis, likely reactive. No organized abdominopelvic fluid collection or abscess. No pneumoperitoneum. Musculoskeletal: No acute or significant osseous findings. Nonspecific stranding within the soft tissues of the anterior proximal thighs bilaterally, unchanged from prior (series 2, image 96). IMPRESSION: 1. Small-bowel obstruction with transition to collapsed bowel anteriorly adjacent to the umbilicus, presumably secondary to adhesions from prior hernia surgery. No pneumatosis or pneumoperitoneum. 2. Extensive sigmoid diverticulosis without evidence of acute diverticulitis. 3. Mild circumferential urinary bladder wall thickening. Correlate with urinalysis to exclude cystitis. 4. Trace free fluid within the pelvis, likely reactive. No organized abdominopelvic fluid collection or abscess. Electronically Signed   By: Duanne Guess D.O.   On: 08/20/2020 11:27   DG Abd Portable 1V-Small Bowel Obstruction Protocol-initial, 8 hr delay  Result Date: 08/21/2020 CLINICAL DATA:  Small-bowel obstruction. 8 hour film following administration of a enteric contrast. EXAM: PORTABLE ABDOMEN - 1 VIEW COMPARISON:  None. FINDINGS: Nasogastric tube is seen within the gastric fundus. Enteric contrast remains within the stomach without antegrade passage. Multiple dilated loops of small bowel are again seen throughout the abdomen discordant with the degree of a gaseous distension of the colon in keeping with a distal small bowel obstruction. No free intraperitoneal gas. IMPRESSION: Distal small bowel obstruction. Administered enteric contrast remains only within the stomach.  Electronically Signed   By: Helyn Numbers MD   On: 08/21/2020 02:49    Anti-infectives: Anti-infectives (From admission, onward)    None        Assessment/Plan Recurrent SBO - CT 6/9 with Small-bowel obstruction with transition to collapsed bowel anteriorly adjacent to the umbilicus - this is the same as previous scan in 2020 - NGT out FEN: reg diet today VTE: ok for chemical prophylaxis from a surgical standpoint ID: no current abx  If tolerates reg diet today, ok to d/c.  F/u with [place] as needed.  Will sign off.  Please call us if he does not cont to progress apprpriately   LOS: 2 days    Vanita Panda, MD [facility] 08/22/2020, 8:44 AM Please see Amion for pager number during day hours 7:00am-4:30pm

## 2021-01-27 ENCOUNTER — Other Ambulatory Visit: Payer: Self-pay | Admitting: Surgery

## 2021-02-02 ENCOUNTER — Encounter (HOSPITAL_BASED_OUTPATIENT_CLINIC_OR_DEPARTMENT_OTHER): Payer: Self-pay | Admitting: *Deleted

## 2021-02-02 ENCOUNTER — Inpatient Hospital Stay (HOSPITAL_BASED_OUTPATIENT_CLINIC_OR_DEPARTMENT_OTHER)
Admission: EM | Admit: 2021-02-02 | Discharge: 2021-02-03 | DRG: 390 | Disposition: A | Discharge status: Home / Self Care | Payer: BC Managed Care – PPO | Attending: Internal Medicine | Admitting: Internal Medicine

## 2021-02-02 ENCOUNTER — Other Ambulatory Visit: Payer: Self-pay

## 2021-02-02 DIAGNOSIS — Z886 Allergy status to analgesic agent status: Secondary | ICD-10-CM

## 2021-02-02 DIAGNOSIS — K5651 Intestinal adhesions [bands], with partial obstruction: Principal | ICD-10-CM | POA: Diagnosis present

## 2021-02-02 DIAGNOSIS — Z6835 Body mass index (BMI) 35.0-35.9, adult: Secondary | ICD-10-CM

## 2021-02-02 DIAGNOSIS — N400 Enlarged prostate without lower urinary tract symptoms: Secondary | ICD-10-CM | POA: Diagnosis present

## 2021-02-02 DIAGNOSIS — F988 Other specified behavioral and emotional disorders with onset usually occurring in childhood and adolescence: Secondary | ICD-10-CM | POA: Diagnosis present

## 2021-02-02 DIAGNOSIS — Z8371 Family history of colonic polyps: Secondary | ICD-10-CM

## 2021-02-02 DIAGNOSIS — Z885 Allergy status to narcotic agent status: Secondary | ICD-10-CM

## 2021-02-02 DIAGNOSIS — Z8249 Family history of ischemic heart disease and other diseases of the circulatory system: Secondary | ICD-10-CM

## 2021-02-02 DIAGNOSIS — Z0189 Encounter for other specified special examinations: Secondary | ICD-10-CM

## 2021-02-02 DIAGNOSIS — Z20822 Contact with and (suspected) exposure to covid-19: Secondary | ICD-10-CM | POA: Diagnosis present

## 2021-02-02 DIAGNOSIS — Z801 Family history of malignant neoplasm of trachea, bronchus and lung: Secondary | ICD-10-CM

## 2021-02-02 DIAGNOSIS — Z825 Family history of asthma and other chronic lower respiratory diseases: Secondary | ICD-10-CM

## 2021-02-02 DIAGNOSIS — G4730 Sleep apnea, unspecified: Secondary | ICD-10-CM | POA: Diagnosis present

## 2021-02-02 DIAGNOSIS — Z803 Family history of malignant neoplasm of breast: Secondary | ICD-10-CM

## 2021-02-02 DIAGNOSIS — D72829 Elevated white blood cell count, unspecified: Secondary | ICD-10-CM | POA: Diagnosis present

## 2021-02-02 DIAGNOSIS — K56609 Unspecified intestinal obstruction, unspecified as to partial versus complete obstruction: Secondary | ICD-10-CM | POA: Diagnosis not present

## 2021-02-02 DIAGNOSIS — Z79899 Other long term (current) drug therapy: Secondary | ICD-10-CM

## 2021-02-02 DIAGNOSIS — G4733 Obstructive sleep apnea (adult) (pediatric): Secondary | ICD-10-CM | POA: Diagnosis present

## 2021-02-02 LAB — CBC
HCT: 57.6 % — ABNORMAL HIGH (ref 39.0–52.0)
Hemoglobin: 18.9 g/dL — ABNORMAL HIGH (ref 13.0–17.0)
MCH: 28.6 pg (ref 26.0–34.0)
MCHC: 32.8 g/dL (ref 30.0–36.0)
MCV: 87.3 fL (ref 80.0–100.0)
Platelets: 328 10*3/uL (ref 150–400)
RBC: 6.6 MIL/uL — ABNORMAL HIGH (ref 4.22–5.81)
RDW: 16.2 % — ABNORMAL HIGH (ref 11.5–15.5)
WBC: 23.4 10*3/uL — ABNORMAL HIGH (ref 4.0–10.5)
nRBC: 0 % (ref 0.0–0.2)

## 2021-02-02 NOTE — ED Triage Notes (Addendum)
C/o mid abd pain and  n/v x 7 hrs , pt kneeling on floor and cursing in triage

## 2021-02-03 ENCOUNTER — Emergency Department (HOSPITAL_BASED_OUTPATIENT_CLINIC_OR_DEPARTMENT_OTHER): Payer: BC Managed Care – PPO

## 2021-02-03 ENCOUNTER — Inpatient Hospital Stay (HOSPITAL_COMMUNITY): Payer: BC Managed Care – PPO

## 2021-02-03 ENCOUNTER — Encounter (HOSPITAL_COMMUNITY): Payer: Self-pay | Admitting: Internal Medicine

## 2021-02-03 DIAGNOSIS — D72829 Elevated white blood cell count, unspecified: Secondary | ICD-10-CM | POA: Diagnosis present

## 2021-02-03 DIAGNOSIS — K5651 Intestinal adhesions [bands], with partial obstruction: Secondary | ICD-10-CM | POA: Diagnosis present

## 2021-02-03 DIAGNOSIS — K56609 Unspecified intestinal obstruction, unspecified as to partial versus complete obstruction: Secondary | ICD-10-CM | POA: Diagnosis present

## 2021-02-03 DIAGNOSIS — N400 Enlarged prostate without lower urinary tract symptoms: Secondary | ICD-10-CM | POA: Diagnosis present

## 2021-02-03 DIAGNOSIS — G4733 Obstructive sleep apnea (adult) (pediatric): Secondary | ICD-10-CM | POA: Diagnosis present

## 2021-02-03 DIAGNOSIS — Z6835 Body mass index (BMI) 35.0-35.9, adult: Secondary | ICD-10-CM | POA: Diagnosis not present

## 2021-02-03 DIAGNOSIS — Z8371 Family history of colonic polyps: Secondary | ICD-10-CM | POA: Diagnosis not present

## 2021-02-03 DIAGNOSIS — Z8249 Family history of ischemic heart disease and other diseases of the circulatory system: Secondary | ICD-10-CM | POA: Diagnosis not present

## 2021-02-03 DIAGNOSIS — Z79899 Other long term (current) drug therapy: Secondary | ICD-10-CM | POA: Diagnosis not present

## 2021-02-03 DIAGNOSIS — F988 Other specified behavioral and emotional disorders with onset usually occurring in childhood and adolescence: Secondary | ICD-10-CM | POA: Diagnosis present

## 2021-02-03 DIAGNOSIS — Z885 Allergy status to narcotic agent status: Secondary | ICD-10-CM | POA: Diagnosis not present

## 2021-02-03 DIAGNOSIS — Z801 Family history of malignant neoplasm of trachea, bronchus and lung: Secondary | ICD-10-CM | POA: Diagnosis not present

## 2021-02-03 DIAGNOSIS — Z20822 Contact with and (suspected) exposure to covid-19: Secondary | ICD-10-CM | POA: Diagnosis present

## 2021-02-03 DIAGNOSIS — Z886 Allergy status to analgesic agent status: Secondary | ICD-10-CM | POA: Diagnosis not present

## 2021-02-03 DIAGNOSIS — Z825 Family history of asthma and other chronic lower respiratory diseases: Secondary | ICD-10-CM | POA: Diagnosis not present

## 2021-02-03 DIAGNOSIS — Z803 Family history of malignant neoplasm of breast: Secondary | ICD-10-CM | POA: Diagnosis not present

## 2021-02-03 LAB — RESP PANEL BY RT-PCR (FLU A&B, COVID) ARPGX2
Influenza A by PCR: NEGATIVE
Influenza B by PCR: NEGATIVE
SARS Coronavirus 2 by RT PCR: NEGATIVE

## 2021-02-03 LAB — LIPASE, BLOOD: Lipase: 41 U/L (ref 11–51)

## 2021-02-03 LAB — COMPREHENSIVE METABOLIC PANEL
ALT: 49 U/L — ABNORMAL HIGH (ref 0–44)
AST: 48 U/L — ABNORMAL HIGH (ref 15–41)
Albumin: 4.4 g/dL (ref 3.5–5.0)
Alkaline Phosphatase: 39 U/L (ref 38–126)
Anion gap: 15 (ref 5–15)
BUN: 13 mg/dL (ref 6–20)
CO2: 20 mmol/L — ABNORMAL LOW (ref 22–32)
Calcium: 9.5 mg/dL (ref 8.9–10.3)
Chloride: 98 mmol/L (ref 98–111)
Creatinine, Ser: 1.3 mg/dL — ABNORMAL HIGH (ref 0.61–1.24)
GFR, Estimated: >60 mL/min (ref >60)
Glucose, Bld: 150 mg/dL — ABNORMAL HIGH (ref 70–99)
Potassium: 3.9 mmol/L (ref 3.5–5.1)
Sodium: 133 mmol/L — ABNORMAL LOW (ref 135–145)
Total Bilirubin: 1.5 mg/dL — ABNORMAL HIGH (ref 0.3–1.2)
Total Protein: 8.6 g/dL — ABNORMAL HIGH (ref 6.5–8.1)

## 2021-02-03 LAB — CBC
HCT: 53.5 % — ABNORMAL HIGH (ref 39.0–52.0)
Hemoglobin: 17.6 g/dL — ABNORMAL HIGH (ref 13.0–17.0)
MCH: 28.9 pg (ref 26.0–34.0)
MCHC: 32.9 g/dL (ref 30.0–36.0)
MCV: 88 fL (ref 80.0–100.0)
Platelets: 315 10*3/uL (ref 150–400)
RBC: 6.08 MIL/uL — ABNORMAL HIGH (ref 4.22–5.81)
RDW: 15.7 % — ABNORMAL HIGH (ref 11.5–15.5)
WBC: 17.4 10*3/uL — ABNORMAL HIGH (ref 4.0–10.5)
nRBC: 0 % (ref 0.0–0.2)

## 2021-02-03 LAB — BASIC METABOLIC PANEL
Anion gap: 10 (ref 5–15)
BUN: 14 mg/dL (ref 6–20)
CO2: 24 mmol/L (ref 22–32)
Calcium: 8.8 mg/dL — ABNORMAL LOW (ref 8.9–10.3)
Chloride: 101 mmol/L (ref 98–111)
Creatinine, Ser: 1.06 mg/dL (ref 0.61–1.24)
GFR, Estimated: >60 mL/min (ref >60)
Glucose, Bld: 118 mg/dL — ABNORMAL HIGH (ref 70–99)
Potassium: 4.3 mmol/L (ref 3.5–5.1)
Sodium: 135 mmol/L (ref 135–145)

## 2021-02-03 LAB — GLUCOSE, CAPILLARY: Glucose-Capillary: 94 mg/dL (ref 70–99)

## 2021-02-03 MED ORDER — HYDRALAZINE HCL 20 MG/ML IJ SOLN: 10.0000 mg | INTRAMUSCULAR | Status: DC | PRN

## 2021-02-03 MED ORDER — DIATRIZOATE MEGLUMINE & SODIUM 66-10 % PO SOLN
90.0000 mL | Freq: Once | ORAL | Status: AC
  Administered 2021-02-03: 90 mL via ORAL
  Filled 2021-02-03: qty 90

## 2021-02-03 MED ORDER — SENNOSIDES-DOCUSATE SODIUM 8.6-50 MG PO TABS: 1.0000 | ORAL_TABLET | Freq: Every evening | ORAL | Status: DC | PRN

## 2021-02-03 MED ORDER — ACETAMINOPHEN 325 MG PO TABS: 650.0000 mg | ORAL_TABLET | Freq: Four times a day (QID) | ORAL | Status: DC | PRN

## 2021-02-03 MED ORDER — ONDANSETRON HCL 4 MG/2ML IJ SOLN
4.0000 mg | Freq: Once | INTRAMUSCULAR | Status: AC
  Administered 2021-02-03: 4 mg via INTRAVENOUS
  Filled 2021-02-03: qty 2

## 2021-02-03 MED ORDER — ACETAMINOPHEN 650 MG RE SUPP: 650.0000 mg | Freq: Four times a day (QID) | RECTAL | Status: DC | PRN

## 2021-02-03 MED ORDER — HYDROMORPHONE HCL 1 MG/ML IJ SOLN
1.0000 mg | Freq: Once | INTRAMUSCULAR | Status: AC
  Administered 2021-02-03: 1 mg via INTRAVENOUS
  Filled 2021-02-03: qty 1

## 2021-02-03 MED ORDER — GUAIFENESIN 100 MG/5ML PO LIQD: 5.0000 mL | ORAL | Status: DC | PRN

## 2021-02-03 MED ORDER — SODIUM CHLORIDE 0.9 % IV SOLN
12.5000 mg | Freq: Four times a day (QID) | INTRAVENOUS | Status: DC | PRN
  Administered 2021-02-03: 12.5 mg via INTRAVENOUS
  Filled 2021-02-03 (×2): qty 0.5

## 2021-02-03 MED ORDER — HYDROMORPHONE HCL 1 MG/ML IJ SOLN
1.0000 mg | INTRAMUSCULAR | Status: DC | PRN
  Administered 2021-02-03: 1 mg via INTRAVENOUS
  Filled 2021-02-03: qty 1

## 2021-02-03 MED ORDER — LACTATED RINGERS IV SOLN: INTRAVENOUS | Status: DC

## 2021-02-03 MED ORDER — LACTATED RINGERS IV BOLUS
1000.0000 mL | Freq: Once | INTRAVENOUS | Status: AC
  Administered 2021-02-03: 1000 mL via INTRAVENOUS

## 2021-02-03 MED ORDER — TRAZODONE HCL 50 MG PO TABS: 50.0000 mg | ORAL_TABLET | Freq: Every evening | ORAL | Status: DC | PRN

## 2021-02-03 MED ORDER — ONDANSETRON HCL 4 MG/2ML IJ SOLN: 4.0000 mg | Freq: Four times a day (QID) | INTRAMUSCULAR | Status: DC | PRN

## 2021-02-03 MED ORDER — IPRATROPIUM-ALBUTEROL 0.5-2.5 (3) MG/3ML IN SOLN: 3.0000 mL | RESPIRATORY_TRACT | Status: DC | PRN

## 2021-02-03 MED ORDER — SODIUM CHLORIDE 0.9 % IV SOLN: Freq: Once | INTRAVENOUS | Status: AC

## 2021-02-03 MED ORDER — METOPROLOL TARTRATE 5 MG/5ML IV SOLN: 5.0000 mg | INTRAVENOUS | Status: DC | PRN

## 2021-02-03 MED ORDER — ONDANSETRON HCL 4 MG PO TABS: 4.0000 mg | ORAL_TABLET | Freq: Four times a day (QID) | ORAL | Status: DC | PRN

## 2021-02-03 MED ORDER — OXYCODONE HCL 5 MG PO TABS: 5.0000 mg | ORAL_TABLET | ORAL | Status: DC | PRN

## 2021-02-03 NOTE — ED Provider Notes (Signed)
[facility] Provider Note: Lowella Dell, MD, FACEP  CSN: [id number] MRN: [id number] ARRIVAL: 02/02/21 at 2314 ROOM: MH03/MH03   CHIEF COMPLAINT  Abdominal Pain   HISTORY OF PRESENT ILLNESS  02/03/21 12:13 AM Jose Shaffer is a 50 y.o. male with history of bowel obstructions.  He is here with abdominal pain that began about 4 PM yesterday evening.  The pain is located periumbilically.  He rates it as a 10 out of 10.  It is sharp in nature.  It is worse with movement or palpation.  His abdomen is distended as well.  He has vomited about 4 times.  He has not been able to pass gas.  Symptoms are similar to previous bowel obstructions.  He is scheduled 02/12/2021 for revision of an umbilical herniorrhaphy that has not properly healed.    Past Medical History:  Diagnosis Date   ADD (attention deficit disorder)    Allergy    Anxiety state, unspecified 03/10/2013   Diverticulitis    Insomnia    Long-term current use of testosterone replacement therapy    Sleep apnea     Past Surgical History:  Procedure Laterality Date   HERNIA REPAIR N/A    Phreesia 05/24/2020   INGUINAL HERNIA REPAIR Bilateral 01/22/2015   Procedure: LAPAROSCOPIC BILATERAL INGUINAL HERNIA REPAIR WITH MESH AND UMBILICAL HERNIA REPAIR;  Surgeon: Abigail Miyamoto, MD;  Location: [facility];  Service: General;  Laterality: Bilateral;   QUADRICEPS TENDON REPAIR  08/2008   UMBILICAL HERNIA REPAIR N/A 01/22/2015   Procedure: UMBILICAL HERNIA REPAIR;  Surgeon: Abigail Miyamoto, MD;  Location: [facility];  Service: General;  Laterality: N/A;    Family History  Problem Relation Age of Onset   Hypertension Father    Colon polyps Father    Cancer Maternal Grandmother 33       breast cancer   Hypertension Paternal Grandmother    COPD Paternal Grandfather        lung cancer; tobacco use, Doctor, general practice   Cancer Maternal Aunt        breast cancer, late 50's   Breast  cancer Maternal Aunt    Cancer Maternal Aunt        breast cancer; diagnosed late 50's   Breast cancer Maternal Aunt    Colon cancer Neg Hx    Rectal cancer Neg Hx    Stomach cancer Neg Hx    Esophageal cancer Neg Hx     Social History   Tobacco Use   Smoking status: Never   Smokeless tobacco: Never  Vaping Use   Vaping Use: Never used  Substance Use Topics   Alcohol use: Yes    Comment: rare    Drug use: Yes    Types: Marijuana    Comment: States he recently quit    Prior to Admission medications   Medication Sig Start Date End Date Taking? Authorizing Provider  alfuzosin (UROXATRAL) 10 MG 24 hr tablet Take 10 mg by mouth daily. 07/28/20   [provider]  fluticasone (FLONASE) 50 MCG/ACT nasal spray Place 1 spray into both nostrils every evening. 05/27/20   Just, Azalee Course, FNP  guanFACINE (INTUNIV) 1 MG TB24 ER tablet Take 1 mg by mouth daily. 09/11/19   [provider]  JORNAY PM 60 MG CP24 Take 60 mg by mouth at bedtime. 07/27/20   [provider]  montelukast (SINGULAIR) 10 MG tablet TAKE 1 TABLET(10 MG) BY MOUTH AT BEDTIME 05/27/20  Just, Azalee Course, FNP    Allergies Other, Ibuprofen, and Tramadol   REVIEW OF SYSTEMS  Negative except as noted here or in the History of Present Illness.   PHYSICAL EXAMINATION  Initial Vital Signs Blood pressure (!) 126/95, pulse (!) 106, temperature 97.6 F (36.4 C), resp. rate (!) 22, height 5\' 10"  (1.778 m), weight 113.4 kg, SpO2 91 %.  Examination General: Well-developed, well-nourished male in no acute distress; appearance consistent with age of record HENT: normocephalic; atraumatic Eyes: Normal appearance Neck: supple Heart: regular rate and rhythm Lungs: clear to auscultation bilaterally Abdomen: soft; diffusely tender; distended;; bowel sounds high-pitched; granulation tissue of umbilicus Extremities: No deformity; full range of motion; pulses normal Neurologic: Awake, alert and oriented; motor  function intact in all extremities and symmetric; no facial droop Skin: Warm and dry Psychiatric: Grimacing   RESULTS  Summary of this visit's results, reviewed and interpreted by myself:   EKG Interpretation  Date/Time:    Ventricular Rate:    PR Interval:    QRS Duration:   QT Interval:    QTC Calculation:   R Axis:     Text Interpretation:         Laboratory Studies: Results for orders placed or performed during the hospital encounter of 02/02/21 (from the past 24 hour(s))  Lipase, blood     Status: None   Collection Time: 02/02/21 11:47 PM  Result Value Ref Range   Lipase 41 11 - 51 U/L  Comprehensive metabolic panel     Status: Abnormal   Collection Time: 02/02/21 11:47 PM  Result Value Ref Range   Sodium 133 (L) 135 - 145 mmol/L   Potassium 3.9 3.5 - 5.1 mmol/L   Chloride 98 98 - 111 mmol/L   CO2 20 (L) 22 - 32 mmol/L   Glucose, Bld 150 (H) 70 - 99 mg/dL   BUN 13 6 - 20 mg/dL   Creatinine, Ser 9.32 (H) 0.61 - 1.24 mg/dL   Calcium 9.5 8.9 - 67.1 mg/dL   Total Protein 8.6 (H) 6.5 - 8.1 g/dL   Albumin 4.4 3.5 - 5.0 g/dL   AST 48 (H) 15 - 41 U/L   ALT 49 (H) 0 - 44 U/L   Alkaline Phosphatase 39 38 - 126 U/L   Total Bilirubin 1.5 (H) 0.3 - 1.2 mg/dL   GFR, Estimated >24 >58 mL/min   Anion gap 15 5 - 15  CBC     Status: Abnormal   Collection Time: 02/02/21 11:47 PM  Result Value Ref Range   WBC 23.4 (H) 4.0 - 10.5 K/uL   RBC 6.60 (H) 4.22 - 5.81 MIL/uL   Hemoglobin 18.9 (H) 13.0 - 17.0 g/dL   HCT 09.9 (H) 83.3 - 82.5 %   MCV 87.3 80.0 - 100.0 fL   MCH 28.6 26.0 - 34.0 pg   MCHC 32.8 30.0 - 36.0 g/dL   RDW 05.3 (H) 97.6 - 73.4 %   Platelets 328 150 - 400 K/uL   nRBC 0.0 0.0 - 0.2 %  Resp Panel by RT-PCR (Flu A&B, Covid) Nasopharyngeal Swab     Status: None   Collection Time: 02/03/21  1:29 AM   Specimen: Nasopharyngeal Swab; Nasopharyngeal(NP) swabs in vial transport medium  Result Value Ref Range   SARS Coronavirus 2 by RT PCR NEGATIVE NEGATIVE    Influenza A by PCR NEGATIVE NEGATIVE   Influenza B by PCR NEGATIVE NEGATIVE   Imaging Studies: CT ABDOMEN PELVIS WO CONTRAST  Result Date: 02/03/2021  CLINICAL DATA:  Abdominal pain.  Concern for bowel obstruction. EXAM: CT ABDOMEN AND PELVIS WITHOUT CONTRAST TECHNIQUE: Multidetector CT imaging of the abdomen and pelvis was performed following the standard protocol without IV contrast. COMPARISON:  CT abdomen pelvis dated 08/20/2020. FINDINGS: Evaluation of this exam is limited in the absence of intravenous contrast. Lower chest: Minimal bibasilar dependent atelectasis. The visualized lung bases are otherwise clear. No intra-abdominal free air or free fluid. Hepatobiliary: No focal liver abnormality is seen. No gallstones, gallbladder wall thickening, or biliary dilatation. Pancreas: Unremarkable. No pancreatic ductal dilatation or surrounding inflammatory changes. Spleen: Normal in size without focal abnormality. Adrenals/Urinary Tract: Adrenal glands are unremarkable. Kidneys are normal, without renal calculi, focal lesion, or hydronephrosis. Bladder is unremarkable. Stomach/Bowel: There is diffuse dilatation of loops of small bowel in the upper and mid abdomen measuring up to 4 cm in diameter. The distal small bowel collapse. A transition is noted adjacent to the umbilicus where there is diastasis of anterior abdominal wall musculature with abutment of small bowel to the anterior peritoneal wall consistent with adhesions. There is sigmoid diverticulosis without active inflammatory changes. The appendix is normal. Vascular/Lymphatic: The abdominal aorta and IVC unremarkable. No portal venous gas. There is no adenopathy. Reproductive: The prostate and seminal vesicles are grossly unremarkable. No pelvic mass. The right testicle is in the right inguinal canal. Other: None Musculoskeletal: Degenerative changes of the spine. No acute osseous pathology. IMPRESSION: 1. Small-bowel obstruction with transition  adjacent to the umbilicus secondary to adhesions. 2. Sigmoid diverticulosis. Electronically Signed   By: Elgie Collard M.D.   On: 02/03/2021 02:36   DG ABD ACUTE 2+V W 1V CHEST  Result Date: 02/03/2021 CLINICAL DATA:  Mid abdominal pain for several hours, initial encounter EXAM: DG ABDOMEN ACUTE WITH 1 VIEW CHEST COMPARISON:  08/22/2020 FINDINGS: Cardiac shadow is within normal limits. Lungs are well aerated bilaterally. Bilateral nipple shadows are noted. No bony abnormality is seen. Scattered large and small bowel gas is noted. Multiple dilated loops of small bowel with differential air-fluid levels are seen. Colonic air is noted. These changes are consistent with at least partial small bowel obstruction. CT is recommended for further evaluation. No free air is seen. No bony abnormality is noted. IMPRESSION: Changes consistent with at least partial small bowel obstruction. CT would be helpful for further evaluation. Electronically Signed   By: Alcide Clever M.D.   On: 02/03/2021 01:11    ED COURSE and MDM  Nursing notes, initial and subsequent vitals signs, including pulse oximetry, reviewed and interpreted by myself.  Vitals:   02/02/21 2323 02/02/21 2324 02/03/21 0024 02/03/21 0130  BP:  (!) 126/95 (!) 152/92 134/86  Pulse:  (!) 106 (!) 103 87  Resp:  (!) 22 (!) 22   Temp:  97.6 F (36.4 C)    SpO2:  91% 94% 97%  Weight: 113.4 kg     Height: 5\' 10"  (1.778 m)      Medications  HYDROmorphone (DILAUDID) injection 1 mg (1 mg Intravenous Given 02/03/21 0158)  lactated ringers bolus 1,000 mL ( Intravenous Stopped 02/03/21 0126)  ondansetron (ZOFRAN) injection 4 mg (4 mg Intravenous Given 02/03/21 0020)  HYDROmorphone (DILAUDID) injection 1 mg (1 mg Intravenous Given 02/03/21 0020)  0.9 %  sodium chloride infusion ( Intravenous New Bag/Given 02/03/21 0157)   2:25 AM Dr. 02/05/21 to admit to hospitalist service.  Dr. Leafy Half of general surgery consulted.   PROCEDURES  Procedures   ED  DIAGNOSES     ICD-10-CM  1. Small bowel obstruction (HCC)  K56.609          Nikoletta Varma, Jonny Ruiz, MD 02/03/21 [phone]

## 2021-02-03 NOTE — Progress Notes (Signed)
PROGRESS NOTE    Jose Shaffer  WUJ:[id number] DOB: 16-Feb-1971 DOA: 02/02/2021 PCP: Patient, No Pcp Per (Inactive)   Brief Narrative:  50 year old with history of OSA, BPH, ADD, hernia repair with subsequent bowel obstructions admitted to the hospital for abdominal pain found to have small bowel obstruction with transition point near umbilicus due to adhesions.  General surgery was consulted.   Assessment & Plan:   Principal Problem:   Small bowel obstruction (HCC) Active Problems:   ADD (attention deficit disorder)   Sleep apnea   BPH (benign prostatic hyperplasia)   Leukocytosis  Small bowel obstruction, partial - Currently patient is n.p.o.  Surgery team following.  Pain control, supportive care, ambulation.  Patient may require surgical intervention  Leukocytosis - No evidence of infection.  Suspect secondary to dehydration and reactive  BPH - Currently p.o. meds on hold  Sleep apnea - Bedtime CPAP  ADD - Home p.o. meds on hold   DVT prophylaxis: SCDs Code Status: Full code Family Communication:    Status is: Inpatient  Remains inpatient appropriate because: Maintain hospital stay until cleared by general surgery          Subjective: Patient said he passed a little gas this morning and has been ambulating in the room.  States abdominal discomfort is little better  Review of Systems Otherwise negative except as per HPI, including: General: Denies fever, chills, night sweats or unintended weight loss. Resp: Denies cough, wheezing, shortness of breath. Cardiac: Denies chest pain, palpitations, orthopnea, paroxysmal nocturnal dyspnea. GI: Denies abdominal pain, nausea, vomiting, diarrhea or constipation GU: Denies dysuria, frequency, hesitancy or incontinence MS: Denies muscle aches, joint pain or swelling Neuro: Denies headache, neurologic deficits (focal weakness, numbness, tingling), abnormal gait Psych: Denies anxiety, depression,  SI/HI/AVH Skin: Denies new rashes or lesions ID: Denies sick contacts, exotic exposures, travel  Examination:  General exam: Appears calm and comfortable  Respiratory system: Clear to auscultation. Respiratory effort normal. Cardiovascular system: S1 & S2 heard, RRR. No JVD, murmurs, rubs, gallops or clicks. No pedal edema. Gastrointestinal system: Abdomen is nondistended, soft and nontender. No organomegaly or masses felt. Normal bowel sounds heard. Central nervous system: Alert and oriented. No focal neurological deficits. Extremities: Symmetric 5 x 5 power. Skin: No rashes, lesions or ulcers Psychiatry: Judgement and insight appear normal. Mood & affect appropriate.     Objective: Vitals:   02/03/21 0130 02/03/21 0247 02/03/21 0356 02/03/21 0521  BP: 134/86 128/75 138/71 139/77  Pulse: 87 90 85 93  Resp:   18 17  Temp:   99.2 F (37.3 C) 98.1 F (36.7 C)  TempSrc:   Oral Oral  SpO2: 97% 95% 96% 94%  Weight:      Height:        Intake/Output Summary (Last 24 hours) at 02/03/2021 0804 Last data filed at 02/03/2021 0600 Gross per 24 hour  Intake 0 ml  Output 0 ml  Net 0 ml   Filed Weights   02/02/21 2323  Weight: 113.4 kg     Data Reviewed:   CBC: Recent Labs  Lab 02/02/21 2347 02/03/21 0538  WBC 23.4* 17.4*  HGB 18.9* 17.6*  HCT 57.6* 53.5*  MCV 87.3 88.0  PLT 328 315   Basic Metabolic Panel: Recent Labs  Lab 02/02/21 2347 02/03/21 0538  NA 133* 135  K 3.9 4.3  CL 98 101  CO2 20* 24  GLUCOSE 150* 118*  BUN 13 14  CREATININE 1.30* 1.06  CALCIUM 9.5 8.8*   GFR: Estimated  Creatinine Clearance: 104 mL/min (by C-G formula based on SCr of 1.06 mg/dL). Liver Function Tests: Recent Labs  Lab 02/02/21 2347  AST 48*  ALT 49*  ALKPHOS 39  BILITOT 1.5*  PROT 8.6*  ALBUMIN 4.4   Recent Labs  Lab 02/02/21 2347  LIPASE 41   No results for input(s): AMMONIA in the last 168 hours. Coagulation Profile: No results for input(s): INR, PROTIME in  the last 168 hours. Cardiac Enzymes: No results for input(s): CKTOTAL, CKMB, CKMBINDEX, TROPONINI in the last 168 hours. BNP (last 3 results) No results for input(s): PROBNP in the last 8760 hours. HbA1C: No results for input(s): HGBA1C in the last 72 hours. CBG: No results for input(s): GLUCAP in the last 168 hours. Lipid Profile: No results for input(s): CHOL, HDL, LDLCALC, TRIG, CHOLHDL, LDLDIRECT in the last 72 hours. Thyroid Function Tests: No results for input(s): TSH, T4TOTAL, FREET4, T3FREE, THYROIDAB in the last 72 hours. Anemia Panel: No results for input(s): VITAMINB12, FOLATE, FERRITIN, TIBC, IRON, RETICCTPCT in the last 72 hours. Sepsis Labs: No results for input(s): PROCALCITON, LATICACIDVEN in the last 168 hours.  Recent Results (from the past 240 hour(s))  Resp Panel by RT-PCR (Flu A&B, Covid) Nasopharyngeal Swab     Status: None   Collection Time: 02/03/21  1:29 AM   Specimen: Nasopharyngeal Swab; Nasopharyngeal(NP) swabs in vial transport medium  Result Value Ref Range Status   SARS Coronavirus 2 by RT PCR NEGATIVE NEGATIVE Final    Comment: (NOTE) SARS-CoV-2 target nucleic acids are NOT DETECTED.  The SARS-CoV-2 RNA is generally detectable in upper respiratory specimens during the acute phase of infection. The lowest concentration of SARS-CoV-2 viral copies this assay can detect is 138 copies/mL. A negative result does not preclude SARS-Cov-2 infection and should not be used as the sole basis for treatment or other patient management decisions. A negative result may occur with  improper specimen collection/handling, submission of specimen other than nasopharyngeal swab, presence of viral mutation(s) within the areas targeted by this assay, and inadequate number of viral copies(<138 copies/mL). A negative result must be combined with clinical observations, patient history, and epidemiological information. The expected result is Negative.  Fact Sheet for  Patients:  BloggerCourse.com  Fact Sheet for Healthcare Providers:  SeriousBroker.it  This test is no t yet approved or cleared by the [place] FDA and  has been authorized for detection and/or diagnosis of SARS-CoV-2 by FDA under an Emergency Use Authorization (EUA). This EUA will remain  in effect (meaning this test can be used) for the duration of the COVID-19 declaration under Section 564(b)(1) of the Act, 21 U.S.C.section 360bbb-3(b)(1), unless the authorization is terminated  or revoked sooner.       Influenza A by PCR NEGATIVE NEGATIVE Final   Influenza B by PCR NEGATIVE NEGATIVE Final    Comment: (NOTE) The Xpert Xpress SARS-CoV-2/FLU/RSV plus assay is intended as an aid in the diagnosis of influenza from Nasopharyngeal swab specimens and should not be used as a sole basis for treatment. Nasal washings and aspirates are unacceptable for Xpert Xpress SARS-CoV-2/FLU/RSV testing.  Fact Sheet for Patients: BloggerCourse.com  Fact Sheet for Healthcare Providers: SeriousBroker.it  This test is not yet approved or cleared by the [place] FDA and has been authorized for detection and/or diagnosis of SARS-CoV-2 by FDA under an Emergency Use Authorization (EUA). This EUA will remain in effect (meaning this test can be used) for the duration of the COVID-19 declaration under Section 564(b)(1) of the Act,  21 U.S.C. section 360bbb-3(b)(1), unless the authorization is terminated or revoked.  Performed at [facility], [address], [place], [place] [address]          Radiology Studies: CT ABDOMEN PELVIS WO CONTRAST  Result Date: 02/03/2021 CLINICAL DATA:  Abdominal pain.  Concern for bowel obstruction. EXAM: CT ABDOMEN AND PELVIS WITHOUT CONTRAST TECHNIQUE: Multidetector CT imaging of the abdomen and pelvis was performed following the standard protocol  without IV contrast. COMPARISON:  CT abdomen pelvis dated 08/20/2020. FINDINGS: Evaluation of this exam is limited in the absence of intravenous contrast. Lower chest: Minimal bibasilar dependent atelectasis. The visualized lung bases are otherwise clear. No intra-abdominal free air or free fluid. Hepatobiliary: No focal liver abnormality is seen. No gallstones, gallbladder wall thickening, or biliary dilatation. Pancreas: Unremarkable. No pancreatic ductal dilatation or surrounding inflammatory changes. Spleen: Normal in size without focal abnormality. Adrenals/Urinary Tract: Adrenal glands are unremarkable. Kidneys are normal, without renal calculi, focal lesion, or hydronephrosis. Bladder is unremarkable. Stomach/Bowel: There is diffuse dilatation of loops of small bowel in the upper and mid abdomen measuring up to 4 cm in diameter. The distal small bowel collapse. A transition is noted adjacent to the umbilicus where there is diastasis of anterior abdominal wall musculature with abutment of small bowel to the anterior peritoneal wall consistent with adhesions. There is sigmoid diverticulosis without active inflammatory changes. The appendix is normal. Vascular/Lymphatic: The abdominal aorta and IVC unremarkable. No portal venous gas. There is no adenopathy. Reproductive: The prostate and seminal vesicles are grossly unremarkable. No pelvic mass. The right testicle is in the right inguinal canal. Other: None Musculoskeletal: Degenerative changes of the spine. No acute osseous pathology. IMPRESSION: 1. Small-bowel obstruction with transition adjacent to the umbilicus secondary to adhesions. 2. Sigmoid diverticulosis. Electronically Signed   By: Elgie Collard M.D.   On: 02/03/2021 02:36   DG ABD ACUTE 2+V W 1V CHEST  Result Date: 02/03/2021 CLINICAL DATA:  Mid abdominal pain for several hours, initial encounter EXAM: DG ABDOMEN ACUTE WITH 1 VIEW CHEST COMPARISON:  08/22/2020 FINDINGS: Cardiac shadow is  within normal limits. Lungs are well aerated bilaterally. Bilateral nipple shadows are noted. No bony abnormality is seen. Scattered large and small bowel gas is noted. Multiple dilated loops of small bowel with differential air-fluid levels are seen. Colonic air is noted. These changes are consistent with at least partial small bowel obstruction. CT is recommended for further evaluation. No free air is seen. No bony abnormality is noted. IMPRESSION: Changes consistent with at least partial small bowel obstruction. CT would be helpful for further evaluation. Electronically Signed   By: Alcide Clever M.D.   On: 02/03/2021 01:11        Scheduled Meds:  diatrizoate meglumine-sodium  90 mL Oral Once   Continuous Infusions:  lactated ringers 125 mL/hr at 02/03/21 0453     LOS: 0 days   Time spent= 35 mins    Hipolito Martinezlopez Joline Maxcy, MD [facility]  If 7PM-7AM, please contact night-coverage  02/03/2021, 8:04 AM

## 2021-02-03 NOTE — H&P (Signed)
History and Physical    Jose Shaffer FIE:[id number] DOB: 28-Jan-1971 DOA: 02/02/2021  PCP: Patient, No Pcp Per (Inactive)   Patient coming from: Home via [facility]  Chief Complaint: Abdominal pain, nausea and vomiting  HPI: Jose Shaffer is a 50 y.o. male with medical history significant for OSA, BPH, ADD, hernia repair with subsequent bowel obstructions. Presents with sudden onset of abdominal pain yesterday afternoon that aggressively got worse.  He reports the pain at its worst is a 10 out of 10.  He reports pain is in the periumbilical region and was associated with multiple episodes of nausea and vomiting.  He had no injury to his abdomen prior to the pain beginning.  Reports pain is a sharp stabbing pain that is worse with palpation or movement.  He states he does have some mild abdominal distention.  He has not had any blood or coffee-ground material when he vomits.  He reports he has not had gas or had a bowel movement since the abdominal pain started.  He reports it feels similar to when he has had previous bowel obstructions.  He reports he had a hernia repair few years ago and since then has had multiple bouts of small bowel obstruction.  Lives with his wife.  Denies tobacco alcohol or illicit drug use.  ED Course: Jose Shaffer has been hemodynamically stable in the emergency room and since transferred to [facility].  CT of the abdomen shows small bowel obstruction with transition point near the umbilicus due to adhesions.  Lab work reveals WBC 22,400 hemoglobin 18.9 hematocrit 57.6 platelets 328,000 sodium 133 potassium 3.9 chloride 98 bicarb 20 creatinine 1.30 BUN 13 glucose 150 calcium 9.5 alkaline phosphatase 39 AST 48 ALT 49 bilirubin 1.5 lipase 41.  COVID negative, influenza A and B are negative.  Patient was discussed with on-call surgery, Dr. Donell Beers, who will see patient in the morning for surgical evaluation.  Hospitalist service been asked to admit for further  management. Patient was given IV fluids and pain control in the emergency room  Review of Systems:  General: Denies fever, chills, weight loss, night sweats.  Denies dizziness.   HENT: Denies head trauma, headache, denies change in hearing, tinnitus. Denies nasal congestion or bleeding.  Denies sore throat.  Denies difficulty swallowing Eyes: Denies blurry vision, pain in eye, drainage.  Denies discoloration of eyes. Neck: Denies pain.  Denies swelling.  Denies pain with movement. Cardiovascular: Denies chest pain, palpitations.  Denies edema.  Denies orthopnea Respiratory: Denies shortness of breath, cough.  Denies wheezing.  Denies sputum production Gastrointestinal: Reports abdominal pain, swelling, nausea, vomiting. Denies diarrhea.  Denies melena.  Denies hematemesis. Musculoskeletal: Denies limitation of movement. Denies deformity or swelling. Denies arthralgias or myalgias. Genitourinary: Denies pelvic pain.  Denies urinary frequency or hesitancy.  Denies dysuria.  Skin: Denies rash.  Denies petechiae, purpura, ecchymosis. Neurological: Denies syncope. Denies seizure activity. Denies slurred speech, drooping face. Denies visual change. Psychiatric: Denies depression, anxiety.  Denies hallucinations.  Past Medical History:  Diagnosis Date   ADD (attention deficit disorder)    Allergy    Anxiety state, unspecified 03/10/2013   Diverticulitis    Insomnia    Long-term current use of testosterone replacement therapy    Sleep apnea     Past Surgical History:  Procedure Laterality Date   HERNIA REPAIR N/A    Phreesia 05/24/2020   INGUINAL HERNIA REPAIR Bilateral 01/22/2015   Procedure: LAPAROSCOPIC BILATERAL INGUINAL HERNIA REPAIR WITH MESH AND UMBILICAL HERNIA REPAIR;  Surgeon: Abigail Miyamoto, MD;  Location: [facility];  Service: General;  Laterality: Bilateral;   QUADRICEPS TENDON REPAIR  08/2008   UMBILICAL HERNIA REPAIR N/A 01/22/2015   Procedure: UMBILICAL  HERNIA REPAIR;  Surgeon: Abigail Miyamoto, MD;  Location: [facility];  Service: General;  Laterality: N/A;    Social History  reports that he has never smoked. He has never used smokeless tobacco. He reports current alcohol use. He reports current drug use. Drug: Marijuana.  Allergies  Allergen Reactions   Other Hives, Rash and Swelling   Ibuprofen Other (See Comments)    Per patient feels like KNOT in throat and itching with rash   Tramadol Anxiety    Family History  Problem Relation Age of Onset   Hypertension Father    Colon polyps Father    Cancer Maternal Grandmother 75       breast cancer   Hypertension Paternal Grandmother    COPD Paternal Grandfather        lung cancer; tobacco use, Doctor, general practice   Cancer Maternal Aunt        breast cancer, late 50's   Breast cancer Maternal Aunt    Cancer Maternal Aunt        breast cancer; diagnosed late 50's   Breast cancer Maternal Aunt    Colon cancer Neg Hx    Rectal cancer Neg Hx    Stomach cancer Neg Hx    Esophageal cancer Neg Hx      Prior to Admission medications   Medication Sig Start Date End Date Taking? Authorizing Provider  alfuzosin (UROXATRAL) 10 MG 24 hr tablet Take 10 mg by mouth daily. 07/28/20   [provider]  fluticasone (FLONASE) 50 MCG/ACT nasal spray Place 1 spray into both nostrils every evening. 05/27/20   Just, Azalee Course, FNP  guanFACINE (INTUNIV) 1 MG TB24 ER tablet Take 1 mg by mouth daily. 09/11/19   [provider]  JORNAY PM 60 MG CP24 Take 60 mg by mouth at bedtime. 07/27/20   [provider]  montelukast (SINGULAIR) 10 MG tablet TAKE 1 TABLET(10 MG) BY MOUTH AT BEDTIME 05/27/20   Just, Azalee Course, FNP    Physical Exam: Vitals:   02/03/21 0024 02/03/21 0130 02/03/21 0247 02/03/21 0356  BP: (!) 152/92 134/86 128/75 138/71  Pulse: (!) 103 87 90 85  Resp: (!) 22   18  Temp:    99.2 F (37.3 C)  TempSrc:    Oral  SpO2: 94% 97% 95% 96%  Weight:       Height:        Constitutional: NAD, calm, comfortable Vitals:   02/03/21 0024 02/03/21 0130 02/03/21 0247 02/03/21 0356  BP: (!) 152/92 134/86 128/75 138/71  Pulse: (!) 103 87 90 85  Resp: (!) 22   18  Temp:    99.2 F (37.3 C)  TempSrc:    Oral  SpO2: 94% 97% 95% 96%  Weight:      Height:       General: WDWN, Alert and oriented x3.  Eyes: EOMI, PERRL, conjunctivae normal.  Sclera nonicteric HENT:  [place]/AT, external ears normal.  Nares patent without epistasis.  Mucous membranes are dry Neck: Soft, normal range of motion, supple, no masses, Trachea midline Respiratory: clear to auscultation bilaterally, no wheezing, no crackles. Normal respiratory effort. No accessory muscle use.  Cardiovascular: Regular rate and rhythm, no murmurs / rubs / gallops. No extremity edema Abdomen: Soft, Periumbilical tenderness, mildly  distended, no rebound or guarding.  No masses palpated. Bowel sounds hypoactive Musculoskeletal: FROM. no cyanosis. No joint deformity upper and lower extremities. Normal muscle tone.  Skin: Warm, dry, intact no rashes, lesions, ulcers. No induration Neurologic: CN 2-12 grossly intact.  Normal speech. Strength 5/5 in all extremities.   Psychiatric: Normal judgment and insight. Normal mood.    Labs on Admission: I have personally reviewed following labs and imaging studies  CBC: Recent Labs  Lab 02/02/21 2347  WBC 23.4*  HGB 18.9*  HCT 57.6*  MCV 87.3  PLT 328    Basic Metabolic Panel: Recent Labs  Lab 02/02/21 2347  NA 133*  K 3.9  CL 98  CO2 20*  GLUCOSE 150*  BUN 13  CREATININE 1.30*  CALCIUM 9.5    GFR: Estimated Creatinine Clearance: 84.8 mL/min (A) (by C-G formula based on SCr of 1.3 mg/dL (H)).  Liver Function Tests: Recent Labs  Lab 02/02/21 2347  AST 48*  ALT 49*  ALKPHOS 39  BILITOT 1.5*  PROT 8.6*  ALBUMIN 4.4    Urine analysis:    Component Value Date/Time   COLORURINE YELLOW 08/20/2020 1037   APPEARANCEUR CLEAR  08/20/2020 1037   LABSPEC 1.020 08/20/2020 1037   PHURINE 6.5 08/20/2020 1037   GLUCOSEU NEGATIVE 08/20/2020 1037   HGBUR TRACE (A) 08/20/2020 1037   BILIRUBINUR NEGATIVE 08/20/2020 1037   BILIRUBINUR negative 02/18/2019 1739   KETONESUR NEGATIVE 08/20/2020 1037   PROTEINUR 100 (A) 08/20/2020 1037   UROBILINOGEN 1.0 02/18/2019 1739   UROBILINOGEN 0.2 10/15/2014 0831   NITRITE NEGATIVE 08/20/2020 1037   LEUKOCYTESUR NEGATIVE 08/20/2020 1037    Radiological Exams on Admission: CT ABDOMEN PELVIS WO CONTRAST  Result Date: 02/03/2021 CLINICAL DATA:  Abdominal pain.  Concern for bowel obstruction. EXAM: CT ABDOMEN AND PELVIS WITHOUT CONTRAST TECHNIQUE: Multidetector CT imaging of the abdomen and pelvis was performed following the standard protocol without IV contrast. COMPARISON:  CT abdomen pelvis dated 08/20/2020. FINDINGS: Evaluation of this exam is limited in the absence of intravenous contrast. Lower chest: Minimal bibasilar dependent atelectasis. The visualized lung bases are otherwise clear. No intra-abdominal free air or free fluid. Hepatobiliary: No focal liver abnormality is seen. No gallstones, gallbladder wall thickening, or biliary dilatation. Pancreas: Unremarkable. No pancreatic ductal dilatation or surrounding inflammatory changes. Spleen: Normal in size without focal abnormality. Adrenals/Urinary Tract: Adrenal glands are unremarkable. Kidneys are normal, without renal calculi, focal lesion, or hydronephrosis. Bladder is unremarkable. Stomach/Bowel: There is diffuse dilatation of loops of small bowel in the upper and mid abdomen measuring up to 4 cm in diameter. The distal small bowel collapse. A transition is noted adjacent to the umbilicus where there is diastasis of anterior abdominal wall musculature with abutment of small bowel to the anterior peritoneal wall consistent with adhesions. There is sigmoid diverticulosis without active inflammatory changes. The appendix is normal.  Vascular/Lymphatic: The abdominal aorta and IVC unremarkable. No portal venous gas. There is no adenopathy. Reproductive: The prostate and seminal vesicles are grossly unremarkable. No pelvic mass. The right testicle is in the right inguinal canal. Other: None Musculoskeletal: Degenerative changes of the spine. No acute osseous pathology. IMPRESSION: 1. Small-bowel obstruction with transition adjacent to the umbilicus secondary to adhesions. 2. Sigmoid diverticulosis. Electronically Signed   By: Elgie Collard M.D.   On: 02/03/2021 02:36   DG ABD ACUTE 2+V W 1V CHEST  Result Date: 02/03/2021 CLINICAL DATA:  Mid abdominal pain for several hours, initial encounter EXAM: DG ABDOMEN ACUTE WITH 1  VIEW CHEST COMPARISON:  08/22/2020 FINDINGS: Cardiac shadow is within normal limits. Lungs are well aerated bilaterally. Bilateral nipple shadows are noted. No bony abnormality is seen. Scattered large and small bowel gas is noted. Multiple dilated loops of small bowel with differential air-fluid levels are seen. Colonic air is noted. These changes are consistent with at least partial small bowel obstruction. CT is recommended for further evaluation. No free air is seen. No bony abnormality is noted. IMPRESSION: Changes consistent with at least partial small bowel obstruction. CT would be helpful for further evaluation. Electronically Signed   By: Alcide Clever M.D.   On: 02/03/2021 01:11     Assessment/Plan Principal Problem:   Small bowel obstruction  Jose Shaffer is admitted to Med/Surg floor.  NPO Surgery consulted and will evaluate in am. Dilaudid for pain control. Zofran ordered for nausea/vomiting If vomiting recurs then NGT will be placed to LIWS. Pt would like to avoid NGT if possible and currently no vomiting and nausea controlled.   Active Problems:   Leukocytosis Secondary to left shift with nausea/vomiting with SBO. No signs/symptoms of infection    BPH (benign prostatic hyperplasia) Chronic.  Resume meds once SBO resolved.     Sleep apnea CPAP at night.     ADD (attention deficit disorder) Chronic. Resume meds once SBO resolved.   DVT prophylaxis: Padua score low. TED hose and early ambulation for DVT prophylaxis.  Code Status:   Full Code  Family Communication:  Diagnosis and plan discussed with patient he verbalized understanding agrees with plan.  Further recommendations to follow as clinical indicated Disposition Plan:   Patient is from:  Home  Anticipated DC to:  Home  Anticipated DC date:  Anticipate 2 midnight or more stay in the hospital  Consults called:  Surgery, Dr. Donell Beers, consulted by ER and will see in am  Admission status:  Inpatient  Claudean Severance Albertina Leise MD [facility]  How to contact the [facility] Attending or Consulting provider 7A - 7P or covering provider during after hours 7P -7A, for this patient?   Check the care team in [facility] and look for a) attending/consulting [facility] provider listed and b) the [facility] team listed Log into [email or web address] and use [place]'s universal password to access. If you do not have the password, please contact the hospital operator. Locate the [facility] provider you are looking for under [facility] and page to a number that you can be directly reached. If you still have difficulty reaching the provider, please page the [facility] (Director on Call) for the Hospitalists listed on amion for assistance.  02/03/2021, 4:38 AM

## 2021-02-03 NOTE — ED Notes (Signed)
Pt returned from CT °

## 2021-02-03 NOTE — Consult Note (Signed)
Reason for Consult:small bowel obstruction Referring Physician: Mol[place]us  Jose Shaffer is an 50 y.o. male.  HPI:  Pt is a 50 yo M with a history of SBO who presents with around 24 hours of abdominal pain, bloating, nausea.  He has had SBOs before but wasn't sure if this was that vs diverticulitis which he has had before.  He last passed gas maybe last night, but it was minimal.  He had a bowel movement 3 pm yesterday. He normally can back off eating and it will get better, but this time it was worse and the pain went across his mid abdomen.   Of note, the patient has had an umbilical hernia repair with Dr. [name] around 5-6 years ago.  He developed a draining wound there around 1 year ago and has been seeing him for this. He has had silver nitrate multiple times and that seems like it has improved it.  He is currently scheduled for exploration of this region and possible explantation of the mesh.   Past Medical History:  Diagnosis Date   ADD (attention deficit disorder)    Allergy    Anxiety state, unspecified 03/10/2013   Diverticulitis    Insomnia    Long-term current use of testosterone replacement therapy    Sleep apnea     Past Surgical History:  Procedure Laterality Date   HERNIA REPAIR N/A    Phreesia 05/24/2020   INGUINAL HERNIA REPAIR Bilateral 01/22/2015   Procedure: LAPAROSCOPIC BILATERAL INGUINAL HERNIA REPAIR WITH MESH AND UMBILICAL HERNIA REPAIR;  Surgeon: Abigail Miyamoto, MD;  Location: [facility];  Service: General;  Laterality: Bilateral;   QUADRICEPS TENDON REPAIR  08/2008   UMBILICAL HERNIA REPAIR N/A 01/22/2015   Procedure: UMBILICAL HERNIA REPAIR;  Surgeon: Abigail Miyamoto, MD;  Location: [facility];  Service: General;  Laterality: N/A;    Family History  Problem Relation Age of Onset   Hypertension Father    Colon polyps Father    Cancer Maternal Grandmother 30       breast cancer   Hypertension Paternal Grandmother     COPD Paternal Grandfather        lung cancer; tobacco use, Doctor, general practice   Cancer Maternal Aunt        breast cancer, late 50's   Breast cancer Maternal Aunt    Cancer Maternal Aunt        breast cancer; diagnosed late 50's   Breast cancer Maternal Aunt    Colon cancer Neg Hx    Rectal cancer Neg Hx    Stomach cancer Neg Hx    Esophageal cancer Neg Hx     Social History:  reports that he has never smoked. He has never used smokeless tobacco. He reports current alcohol use. He reports current drug use. Drug: Marijuana.  Allergies:  Allergies  Allergen Reactions   Other Hives, Rash and Swelling   Ibuprofen Other (See Comments)    Per patient feels like KNOT in throat and itching with rash   Tramadol Anxiety    Medications:  alfuzosin (UROXATRAL) 10 MG 24 hr tablet fluticasone (FLONASE) 50 MCG/ACT nasal spray guanFACINE (INTUNIV) 1 MG TB24 ER tablet JORNAY PM 60 MG CP24 montelukast (SINGULAIR) 10 MG tablet   Results for orders placed or performed during the hospital encounter of 02/02/21 (from the past 48 hour(s))  Lipase, blood     Status: None   Collection Time: 02/02/21 11:47 PM  Result Value Ref Range  Lipase 41 11 - 51 U/L    Comment: Performed at [facility], [address] [place], [place] [address]  Comprehensive metabolic panel     Status: Abnormal   Collection Time: 02/02/21 11:47 PM  Result Value Ref Range   Sodium 133 (L) 135 - 145 mmol/L   Potassium 3.9 3.5 - 5.1 mmol/L   Chloride 98 98 - 111 mmol/L   CO2 20 (L) 22 - 32 mmol/L   Glucose, Bld 150 (H) 70 - 99 mg/dL    Comment: Glucose reference range applies only to samples taken after fasting for at least 8 hours.   BUN 13 6 - 20 mg/dL   Creatinine, Ser 4.49 (H) 0.61 - 1.24 mg/dL   Calcium 9.5 8.9 - 67.5 mg/dL   Total Protein 8.6 (H) 6.5 - 8.1 g/dL   Albumin 4.4 3.5 - 5.0 g/dL   AST 48 (H) 15 - 41 U/L   ALT 49 (H) 0 - 44 U/L   Alkaline Phosphatase 39 38 - 126 U/L   Total Bilirubin 1.5  (H) 0.3 - 1.2 mg/dL   GFR, Estimated >91 >63 mL/min    Comment: (NOTE) Calculated using the CKD-EPI Creatinine Equation (2021)    Anion gap 15 5 - 15    Comment: Performed at [facility], [address] [place], [place] [address]  CBC     Status: Abnormal   Collection Time: 02/02/21 11:47 PM  Result Value Ref Range   WBC 23.4 (H) 4.0 - 10.5 K/uL   RBC 6.60 (H) 4.22 - 5.81 MIL/uL   Hemoglobin 18.9 (H) 13.0 - 17.0 g/dL   HCT 99.3 (H) 57.0 - 17.7 %   MCV 87.3 80.0 - 100.0 fL   MCH 28.6 26.0 - 34.0 pg   MCHC 32.8 30.0 - 36.0 g/dL   RDW 93.9 (H) 03.0 - 09.2 %   Platelets 328 150 - 400 K/uL   nRBC 0.0 0.0 - 0.2 %    Comment: Performed at [facility], [address] [place], [place] [address]  Resp Panel by RT-PCR (Flu A&B, Covid) Nasopharyngeal Swab     Status: None   Collection Time: 02/03/21  1:29 AM   Specimen: Nasopharyngeal Swab; Nasopharyngeal(NP) swabs in vial transport medium  Result Value Ref Range   SARS Coronavirus 2 by RT PCR NEGATIVE NEGATIVE    Comment: (NOTE) SARS-CoV-2 target nucleic acids are NOT DETECTED.  The SARS-CoV-2 RNA is generally detectable in upper respiratory specimens during the acute phase of infection. The lowest concentration of SARS-CoV-2 viral copies this assay can detect is 138 copies/mL. A negative result does not preclude SARS-Cov-2 infection and should not be used as the sole basis for treatment or other patient management decisions. A negative result may occur with  improper specimen collection/handling, submission of specimen other than nasopharyngeal swab, presence of viral mutation(s) within the areas targeted by this assay, and inadequate number of viral copies(<138 copies/mL). A negative result must be combined with clinical observations, patient history, and epidemiological information. The expected result is Negative.  Fact Sheet for Patients:  BloggerCourse.com  Fact Sheet for  Healthcare Providers:  SeriousBroker.it  This test is no t yet approved or cleared by the [place] FDA and  has been authorized for detection and/or diagnosis of SARS-CoV-2 by FDA under an Emergency Use Authorization (EUA). This EUA will remain  in effect (meaning this test can be used) for the duration of the COVID-19  declaration under Section 564(b)(1) of the Act, 21 U.S.C.section 360bbb-3(b)(1), unless the authorization is terminated  or revoked sooner.       Influenza A by PCR NEGATIVE NEGATIVE   Influenza B by PCR NEGATIVE NEGATIVE    Comment: (NOTE) The Xpert Xpress SARS-CoV-2/FLU/RSV plus assay is intended as an aid in the diagnosis of influenza from Nasopharyngeal swab specimens and should not be used as a sole basis for treatment. Nasal washings and aspirates are unacceptable for Xpert Xpress SARS-CoV-2/FLU/RSV testing.  Fact Sheet for Patients: BloggerCourse.com  Fact Sheet for Healthcare Providers: SeriousBroker.it  This test is not yet approved or cleared by the [place] FDA and has been authorized for detection and/or diagnosis of SARS-CoV-2 by FDA under an Emergency Use Authorization (EUA). This EUA will remain in effect (meaning this test can be used) for the duration of the COVID-19 declaration under Section 564(b)(1) of the Act, 21 U.S.C. section 360bbb-3(b)(1), unless the authorization is terminated or revoked.  Performed at [facility], [address] [place], [place] [address]   Basic metabolic panel     Status: Abnormal   Collection Time: 02/03/21  5:38 AM  Result Value Ref Range   Sodium 135 135 - 145 mmol/L   Potassium 4.3 3.5 - 5.1 mmol/L   Chloride 101 98 - 111 mmol/L   CO2 24 22 - 32 mmol/L   Glucose, Bld 118 (H) 70 - 99 mg/dL    Comment: Glucose reference range applies only to samples taken after fasting for at least 8 hours.   BUN 14 6 - 20 mg/dL    Creatinine, Ser 9.14 0.61 - 1.24 mg/dL   Calcium 8.8 (L) 8.9 - 10.3 mg/dL   GFR, Estimated >78 >29 mL/min    Comment: (NOTE) Calculated using the CKD-EPI Creatinine Equation (2021)    Anion gap 10 5 - 15    Comment: Performed at [facility], [address] [place], [place] [address]  CBC     Status: Abnormal   Collection Time: 02/03/21  5:38 AM  Result Value Ref Range   WBC 17.4 (H) 4.0 - 10.5 K/uL   RBC 6.08 (H) 4.22 - 5.81 MIL/uL   Hemoglobin 17.6 (H) 13.0 - 17.0 g/dL   HCT 08.6 (H) 57.8 - 46.9 %   MCV 88.0 80.0 - 100.0 fL   MCH 28.9 26.0 - 34.0 pg   MCHC 32.9 30.0 - 36.0 g/dL   RDW 62.9 (H) 52.8 - 41.3 %   Platelets 315 150 - 400 K/uL   nRBC 0.0 0.0 - 0.2 %    Comment: Performed at [facility], [address] [place], [place] [address]    CT ABDOMEN PELVIS WO CONTRAST  Result Date: 02/03/2021 CLINICAL DATA:  Abdominal pain.  Concern for bowel obstruction. EXAM: CT ABDOMEN AND PELVIS WITHOUT CONTRAST TECHNIQUE: Multidetector CT imaging of the abdomen and pelvis was performed following the standard protocol without IV contrast. COMPARISON:  CT abdomen pelvis dated 08/20/2020. FINDINGS: Evaluation of this exam is limited in the absence of intravenous contrast. Lower chest: Minimal bibasilar dependent atelectasis. The visualized lung bases are otherwise clear. No intra-abdominal free air or free fluid. Hepatobiliary: No focal liver abnormality is seen. No gallstones, gallbladder wall thickening, or biliary dilatation. Pancreas: Unremarkable. No pancreatic ductal dilatation or surrounding inflammatory changes. Spleen: Normal in size without focal abnormality. Adrenals/Urinary Tract: Adrenal glands are unremarkable. Kidneys are normal, without renal calculi, focal lesion, or hydronephrosis. Bladder is unremarkable.  Stomach/Bowel: There is diffuse dilatation of loops of small bowel in the upper and mid abdomen measuring up to 4 cm in diameter. The  distal small bowel collapse. A transition is noted adjacent to the umbilicus where there is diastasis of anterior abdominal wall musculature with abutment of small bowel to the anterior peritoneal wall consistent with adhesions. There is sigmoid diverticulosis without active inflammatory changes. The appendix is normal. Vascular/Lymphatic: The abdominal aorta and IVC unremarkable. No portal venous gas. There is no adenopathy. Reproductive: The prostate and seminal vesicles are grossly unremarkable. No pelvic mass. The right testicle is in the right inguinal canal. Other: None Musculoskeletal: Degenerative changes of the spine. No acute osseous pathology. IMPRESSION: 1. Small-bowel obstruction with transition adjacent to the umbilicus secondary to adhesions. 2. Sigmoid diverticulosis. Electronically Signed   By: Elgie Collard M.D.   On: 02/03/2021 02:36   DG ABD ACUTE 2+V W 1V CHEST  Result Date: 02/03/2021 CLINICAL DATA:  Mid abdominal pain for several hours, initial encounter EXAM: DG ABDOMEN ACUTE WITH 1 VIEW CHEST COMPARISON:  08/22/2020 FINDINGS: Cardiac shadow is within normal limits. Lungs are well aerated bilaterally. Bilateral nipple shadows are noted. No bony abnormality is seen. Scattered large and small bowel gas is noted. Multiple dilated loops of small bowel with differential air-fluid levels are seen. Colonic air is noted. These changes are consistent with at least partial small bowel obstruction. CT is recommended for further evaluation. No free air is seen. No bony abnormality is noted. IMPRESSION: Changes consistent with at least partial small bowel obstruction. CT would be helpful for further evaluation. Electronically Signed   By: Alcide Clever M.D.   On: 02/03/2021 01:11    Review of Systems  Gastrointestinal:  Positive for abdominal distention, abdominal pain, nausea and vomiting.  All other systems reviewed and are negative. Blood pressure 139/77, pulse 93, temperature 98.1 F (36.7  C), temperature source Oral, resp. rate 17, height 5\' 10"  (1.778 m), weight 113.4 kg, SpO2 94 %. Physical Exam Vitals reviewed.  Constitutional:      General: He is not in acute distress.    Appearance: He is well-developed. He is not ill-appearing, toxic-appearing or diaphoretic.  HENT:     Head: Normocephalic and atraumatic.     Mouth/Throat:     Mouth: Mucous membranes are moist.  Eyes:     General: No scleral icterus.    Extraocular Movements: Extraocular movements intact.     Pupils: Pupils are equal, round, and reactive to light.  Cardiovascular:     Rate and Rhythm: Normal rate and regular rhythm.  Pulmonary:     Effort: Pulmonary effort is normal.  Chest:     Chest wall: No tenderness.  Abdominal:     General: Abdomen is protuberant. A surgical scar is present. There is no distension. There are no signs of injury.     Palpations: Abdomen is soft. There is no shifting dullness, fluid wave, hepatomegaly, splenomegaly or mass.     Tenderness: There is no abdominal tenderness. There is no guarding or rebound. Negative signs include Murphy's sign, Rovsing's sign, McBurney's sign and obturator sign.     Hernia: No hernia is present. There is no hernia in the umbilical area (small scabbed over spots near umbilicus).  Skin:    General: Skin is warm and dry.     Capillary Refill: Capillary refill takes 2 to 3 seconds.     Coloration: Skin is not cyanotic, jaundiced, mottled or pale.  Findings: No erythema or rash.  Neurological:     General: No focal deficit present.     Mental Status: He is alert and oriented to person, place, and time.     Cranial Nerves: No cranial nerve deficit.     Motor: No weakness.  Psychiatric:        Mood and Affect: Mood normal. Mood is not anxious or depressed.        Behavior: Behavior normal.    Assessment/Plan: Partial small bowel obstruction NPO Small bowel protocol.  Possibly this SBO is related to umbilical hernia site whether  adhesions predate surgery or not.    Giving contrast orally.   If he requires surgery for SBO, would likely have umbilical exploration and explantation of mesh at that time.   Surgery to follow.   Almond Lint 02/03/2021, 6:28 AM

## 2021-02-03 NOTE — Plan of Care (Signed)
Instructions were reviewed with patient. All questions were answered. Patient was transported to main entrance by wheelchair. ° °

## 2021-02-03 NOTE — ED Notes (Signed)
Pt transferred to [facility] via [facility]. 

## 2021-02-03 NOTE — ED Notes (Signed)
Report given to [facility]. 

## 2021-02-03 NOTE — ED Notes (Signed)
Patient transported to CT 

## 2021-02-04 NOTE — Discharge Summary (Signed)
Physician Discharge Summary  Jose Shaffer ZCH:[id number] DOB: 1970/04/06 DOA: 02/02/2021  PCP: Patient, No Pcp Per (Inactive)  Admit date: 02/02/2021 Discharge date: 02/04/2021  Admitted From: Home Disposition: Home  Recommendations for Outpatient Follow-up:  Follow up with PCP in 1-2 weeks Please obtain BMP/CBC in one week your next doctors visit.  Outpatient instructions provided by general surgery   Discharge Condition: Stable CODE STATUS: Full code Diet recommendation: Slowly advance diet at home.  Brief/Interim Summary: 50 year old with history of OSA, BPH, ADD, hernia repair with subsequent bowel obstructions admitted to the hospital for abdominal pain found to have small bowel obstruction with transition point near umbilicus due to adhesions.  General surgery was consulted.  With conservative management over 24 hours patient symptoms had resolved therefore he was discharged home in stable condition after cleared by general surgery.     Assessment & Plan:   Principal Problem:   Small bowel obstruction (HCC) Active Problems:   ADD (attention deficit disorder)   Sleep apnea   BPH (benign prostatic hyperplasia)   Leukocytosis   Small bowel obstruction, partial - Conservatively managed, seen by general surgery.  Discharged home within 24 hours as it self resolved.  He was advised to follow-up outpatient with general surgery, to be arranged by their service. Case was discussed with Dr. Ramirez/his PA prior to dc   Leukocytosis - No evidence of infection.  Suspect secondary to dehydration and reactive   BPH - Resume home meds   Sleep apnea - Bedtime CPAP   ADD - Resume home meds  Body mass index is 35.87 kg/m.         Discharge Diagnoses:  Principal Problem:   Small bowel obstruction (HCC) Active Problems:   ADD (attention deficit disorder)   Sleep apnea   BPH (benign prostatic hyperplasia)   Leukocytosis      Consultations: General  surgery  Subjective:   Discharge Exam: Vitals:   02/03/21 0951 02/03/21 1336  BP: (!) 148/71 (!) 143/74  Pulse: 89 92  Resp: 16 15  Temp: 98.7 F (37.1 C) 98.7 F (37.1 C)  SpO2: 97% 96%   Vitals:   02/03/21 0356 02/03/21 0521 02/03/21 0951 02/03/21 1336  BP: 138/71 139/77 (!) 148/71 (!) 143/74  Pulse: 85 93 89 92  Resp: 18 17 16 15   Temp: 99.2 F (37.3 C) 98.1 F (36.7 C) 98.7 F (37.1 C) 98.7 F (37.1 C)  TempSrc: Oral Oral Oral Oral  SpO2: 96% 94% 97% 96%  Weight:      Height:        General: Pt is alert, awake, not in acute distress Cardiovascular: RRR, S1/S2 +, no rubs, no gallops Respiratory: CTA bilaterally, no wheezing, no rhonchi Abdominal: Soft, NT, ND, bowel sounds + Extremities: no edema, no cyanosis  Discharge Instructions   Allergies as of 02/03/2021       Reactions   Other Hives, Rash, Swelling   Ibuprofen Other (See Comments)   Per patient feels like KNOT in throat and itching with rash   Tramadol Anxiety        Medication List     TAKE these medications    alfuzosin 10 MG 24 hr tablet Commonly known as: UROXATRAL Take 10 mg by mouth daily.   doxazosin 8 MG tablet Commonly known as: CARDURA Take 8 mg by mouth every evening.   fluticasone 50 MCG/ACT nasal spray Commonly known as: FLONASE Place 1 spray into both nostrils every evening.   guanFACINE 2 MG Tb24 ER  tablet Commonly known as: INTUNIV Take 2 mg by mouth daily.   Methylphenidate HCl ER (PM) 100 MG Cp24 Take 100 mg by mouth at bedtime.   montelukast 10 MG tablet Commonly known as: SINGULAIR TAKE 1 TABLET(10 MG) BY MOUTH AT BEDTIME What changed:  how much to take how to take this when to take this additional instructions   naproxen sodium 220 MG tablet Commonly known as: ALEVE Take 440 mg by mouth daily as needed (pain).        Allergies  Allergen Reactions   Other Hives, Rash and Swelling   Ibuprofen Other (See Comments)    Per patient feels like  KNOT in throat and itching with rash   Tramadol Anxiety    You were cared for by a hospitalist during your hospital stay. If you have any questions about your discharge medications or the care you received while you were in the hospital after you are discharged, you can call the unit and asked to speak with the hospitalist on call if the hospitalist that took care of you is not available. Once you are discharged, your primary care physician will handle any further medical issues. Please note that no refills for any discharge medications will be authorized once you are discharged, as it is imperative that you return to your primary care physician (or establish a relationship with a primary care physician if you do not have one) for your aftercare needs so that they can reassess your need for medications and monitor your lab values.   Procedures/Studies: CT ABDOMEN PELVIS WO CONTRAST  Result Date: 02/03/2021 CLINICAL DATA:  Abdominal pain.  Concern for bowel obstruction. EXAM: CT ABDOMEN AND PELVIS WITHOUT CONTRAST TECHNIQUE: Multidetector CT imaging of the abdomen and pelvis was performed following the standard protocol without IV contrast. COMPARISON:  CT abdomen pelvis dated 08/20/2020. FINDINGS: Evaluation of this exam is limited in the absence of intravenous contrast. Lower chest: Minimal bibasilar dependent atelectasis. The visualized lung bases are otherwise clear. No intra-abdominal free air or free fluid. Hepatobiliary: No focal liver abnormality is seen. No gallstones, gallbladder wall thickening, or biliary dilatation. Pancreas: Unremarkable. No pancreatic ductal dilatation or surrounding inflammatory changes. Spleen: Normal in size without focal abnormality. Adrenals/Urinary Tract: Adrenal glands are unremarkable. Kidneys are normal, without renal calculi, focal lesion, or hydronephrosis. Bladder is unremarkable. Stomach/Bowel: There is diffuse dilatation of loops of small bowel in the upper and  mid abdomen measuring up to 4 cm in diameter. The distal small bowel collapse. A transition is noted adjacent to the umbilicus where there is diastasis of anterior abdominal wall musculature with abutment of small bowel to the anterior peritoneal wall consistent with adhesions. There is sigmoid diverticulosis without active inflammatory changes. The appendix is normal. Vascular/Lymphatic: The abdominal aorta and IVC unremarkable. No portal venous gas. There is no adenopathy. Reproductive: The prostate and seminal vesicles are grossly unremarkable. No pelvic mass. The right testicle is in the right inguinal canal. Other: None Musculoskeletal: Degenerative changes of the spine. No acute osseous pathology. IMPRESSION: 1. Small-bowel obstruction with transition adjacent to the umbilicus secondary to adhesions. 2. Sigmoid diverticulosis. Electronically Signed   By: Elgie Collard M.D.   On: 02/03/2021 02:36   DG ABD ACUTE 2+V W 1V CHEST  Result Date: 02/03/2021 CLINICAL DATA:  Mid abdominal pain for several hours, initial encounter EXAM: DG ABDOMEN ACUTE WITH 1 VIEW CHEST COMPARISON:  08/22/2020 FINDINGS: Cardiac shadow is within normal limits. Lungs are well aerated bilaterally. Bilateral nipple shadows  are noted. No bony abnormality is seen. Scattered large and small bowel gas is noted. Multiple dilated loops of small bowel with differential air-fluid levels are seen. Colonic air is noted. These changes are consistent with at least partial small bowel obstruction. CT is recommended for further evaluation. No free air is seen. No bony abnormality is noted. IMPRESSION: Changes consistent with at least partial small bowel obstruction. CT would be helpful for further evaluation. Electronically Signed   By: Alcide Clever M.D.   On: 02/03/2021 01:11   DG Abd Portable 1V  Result Date: 02/03/2021 CLINICAL DATA:  Small bowel obstruction. EXAM: PORTABLE ABDOMEN - 1 VIEW COMPARISON:  Same day. FINDINGS: Dilated small  bowel loops are again noted, which are filled with contrast currently. This is concerning for distal small bowel obstruction. Stool seen throughout the colon. IMPRESSION: Dilated and contrast filled small bowel loops are again noted, concerning for distal small bowel obstruction. Electronically Signed   By: Lupita Raider M.D.   On: 02/03/2021 13:21     The results of significant diagnostics from this hospitalization (including imaging, microbiology, ancillary and laboratory) are listed below for reference.     Microbiology: Recent Results (from the past 240 hour(s))  Resp Panel by RT-PCR (Flu A&B, Covid) Nasopharyngeal Swab     Status: None   Collection Time: 02/03/21  1:29 AM   Specimen: Nasopharyngeal Swab; Nasopharyngeal(NP) swabs in vial transport medium  Result Value Ref Range Status   SARS Coronavirus 2 by RT PCR NEGATIVE NEGATIVE Final    Comment: (NOTE) SARS-CoV-2 target nucleic acids are NOT DETECTED.  The SARS-CoV-2 RNA is generally detectable in upper respiratory specimens during the acute phase of infection. The lowest concentration of SARS-CoV-2 viral copies this assay can detect is 138 copies/mL. A negative result does not preclude SARS-Cov-2 infection and should not be used as the sole basis for treatment or other patient management decisions. A negative result may occur with  improper specimen collection/handling, submission of specimen other than nasopharyngeal swab, presence of viral mutation(s) within the areas targeted by this assay, and inadequate number of viral copies(<138 copies/mL). A negative result must be combined with clinical observations, patient history, and epidemiological information. The expected result is Negative.  Fact Sheet for Patients:  BloggerCourse.com  Fact Sheet for Healthcare Providers:  SeriousBroker.it  This test is no t yet approved or cleared by the [place] FDA and  has been  authorized for detection and/or diagnosis of SARS-CoV-2 by FDA under an Emergency Use Authorization (EUA). This EUA will remain  in effect (meaning this test can be used) for the duration of the COVID-19 declaration under Section 564(b)(1) of the Act, 21 U.S.C.section 360bbb-3(b)(1), unless the authorization is terminated  or revoked sooner.       Influenza A by PCR NEGATIVE NEGATIVE Final   Influenza B by PCR NEGATIVE NEGATIVE Final    Comment: (NOTE) The Xpert Xpress SARS-CoV-2/FLU/RSV plus assay is intended as an aid in the diagnosis of influenza from Nasopharyngeal swab specimens and should not be used as a sole basis for treatment. Nasal washings and aspirates are unacceptable for Xpert Xpress SARS-CoV-2/FLU/RSV testing.  Fact Sheet for Patients: BloggerCourse.com  Fact Sheet for Healthcare Providers: SeriousBroker.it  This test is not yet approved or cleared by the [place] FDA and has been authorized for detection and/or diagnosis of SARS-CoV-2 by FDA under an Emergency Use Authorization (EUA). This EUA will remain in effect (meaning this test can be used) for the duration  of the COVID-19 declaration under Section 564(b)(1) of the Act, 21 U.S.C. section 360bbb-3(b)(1), unless the authorization is terminated or revoked.  Performed at [facility], [address] [place], [place] [address]      Labs: BNP (last 3 results) No results for input(s): BNP in the last 8760 hours. Basic Metabolic Panel: Recent Labs  Lab 02/02/21 2347 02/03/21 0538  NA 133* 135  K 3.9 4.3  CL 98 101  CO2 20* 24  GLUCOSE 150* 118*  BUN 13 14  CREATININE 1.30* 1.06  CALCIUM 9.5 8.8*   Liver Function Tests: Recent Labs  Lab 02/02/21 2347  AST 48*  ALT 49*  ALKPHOS 39  BILITOT 1.5*  PROT 8.6*  ALBUMIN 4.4   Recent Labs  Lab 02/02/21 2347  LIPASE 41   No results for input(s): AMMONIA in the last 168  hours. CBC: Recent Labs  Lab 02/02/21 2347 02/03/21 0538  WBC 23.4* 17.4*  HGB 18.9* 17.6*  HCT 57.6* 53.5*  MCV 87.3 88.0  PLT 328 315   Cardiac Enzymes: No results for input(s): CKTOTAL, CKMB, CKMBINDEX, TROPONINI in the last 168 hours. BNP: Invalid input(s): POCBNP CBG: Recent Labs  Lab 02/03/21 1332  GLUCAP 94   D-Dimer No results for input(s): DDIMER in the last 72 hours. Hgb A1c No results for input(s): HGBA1C in the last 72 hours. Lipid Profile No results for input(s): CHOL, HDL, LDLCALC, TRIG, CHOLHDL, LDLDIRECT in the last 72 hours. Thyroid function studies No results for input(s): TSH, T4TOTAL, T3FREE, THYROIDAB in the last 72 hours.  Invalid input(s): FREET3 Anemia work up No results for input(s): VITAMINB12, FOLATE, FERRITIN, TIBC, IRON, RETICCTPCT in the last 72 hours. Urinalysis    Component Value Date/Time   COLORURINE YELLOW 08/20/2020 1037   APPEARANCEUR CLEAR 08/20/2020 1037   LABSPEC 1.020 08/20/2020 1037   PHURINE 6.5 08/20/2020 1037   GLUCOSEU NEGATIVE 08/20/2020 1037   HGBUR TRACE (A) 08/20/2020 1037   BILIRUBINUR NEGATIVE 08/20/2020 1037   BILIRUBINUR negative 02/18/2019 1739   KETONESUR NEGATIVE 08/20/2020 1037   PROTEINUR 100 (A) 08/20/2020 1037   UROBILINOGEN 1.0 02/18/2019 1739   UROBILINOGEN 0.2 10/15/2014 0831   NITRITE NEGATIVE 08/20/2020 1037   LEUKOCYTESUR NEGATIVE 08/20/2020 1037   Sepsis Labs Invalid input(s): PROCALCITONIN,  WBC,  LACTICIDVEN Microbiology Recent Results (from the past 240 hour(s))  Resp Panel by RT-PCR (Flu A&B, Covid) Nasopharyngeal Swab     Status: None   Collection Time: 02/03/21  1:29 AM   Specimen: Nasopharyngeal Swab; Nasopharyngeal(NP) swabs in vial transport medium  Result Value Ref Range Status   SARS Coronavirus 2 by RT PCR NEGATIVE NEGATIVE Final    Comment: (NOTE) SARS-CoV-2 target nucleic acids are NOT DETECTED.  The SARS-CoV-2 RNA is generally detectable in upper respiratory specimens  during the acute phase of infection. The lowest concentration of SARS-CoV-2 viral copies this assay can detect is 138 copies/mL. A negative result does not preclude SARS-Cov-2 infection and should not be used as the sole basis for treatment or other patient management decisions. A negative result may occur with  improper specimen collection/handling, submission of specimen other than nasopharyngeal swab, presence of viral mutation(s) within the areas targeted by this assay, and inadequate number of viral copies(<138 copies/mL). A negative result must be combined with clinical observations, patient history, and epidemiological information. The expected result is Negative.  Fact Sheet for Patients:  BloggerCourse.com  Fact Sheet for Healthcare Providers:  SeriousBroker.it  This test is no t yet  approved or cleared by the [place] and  has been authorized for detection and/or diagnosis of SARS-CoV-2 by FDA under an Emergency Use Authorization (EUA). This EUA will remain  in effect (meaning this test can be used) for the duration of the COVID-19 declaration under Section 564(b)(1) of the Act, 21 U.S.C.section 360bbb-3(b)(1), unless the authorization is terminated  or revoked sooner.       Influenza A by PCR NEGATIVE NEGATIVE Final   Influenza B by PCR NEGATIVE NEGATIVE Final    Comment: (NOTE) The Xpert Xpress SARS-CoV-2/FLU/RSV plus assay is intended as an aid in the diagnosis of influenza from Nasopharyngeal swab specimens and should not be used as a sole basis for treatment. Nasal washings and aspirates are unacceptable for Xpert Xpress SARS-CoV-2/FLU/RSV testing.  Fact Sheet for Patients: BloggerCourse.com  Fact Sheet for Healthcare Providers: SeriousBroker.it  This test is not yet approved or cleared by the [place] FDA and has been authorized for detection  and/or diagnosis of SARS-CoV-2 by FDA under an Emergency Use Authorization (EUA). This EUA will remain in effect (meaning this test can be used) for the duration of the COVID-19 declaration under Section 564(b)(1) of the Act, 21 U.S.C. section 360bbb-3(b)(1), unless the authorization is terminated or revoked.  Performed at [facility], [address] [place], [place] [address]      Time coordinating discharge:  I have spent 35 minutes face to face with the patient and on the ward discussing the patients care, assessment, plan and disposition with other care givers. >50% of the time was devoted counseling the patient about the risks and benefits of treatment/Discharge disposition and coordinating care.   SIGNED:   Dimple Nanas, MD  [facility] 02/04/2021, 11:25 AM   If 7PM-7AM, please contact night-coverage

## 2021-05-13 ENCOUNTER — Emergency Department (HOSPITAL_BASED_OUTPATIENT_CLINIC_OR_DEPARTMENT_OTHER): Payer: BC Managed Care – PPO

## 2021-05-13 ENCOUNTER — Inpatient Hospital Stay (HOSPITAL_BASED_OUTPATIENT_CLINIC_OR_DEPARTMENT_OTHER)
Admission: EM | Admit: 2021-05-13 | Discharge: 2021-05-16 | DRG: 389 | Disposition: A | Discharge status: Home / Self Care | Payer: BC Managed Care – PPO | Attending: General Surgery | Admitting: General Surgery

## 2021-05-13 ENCOUNTER — Encounter (HOSPITAL_BASED_OUTPATIENT_CLINIC_OR_DEPARTMENT_OTHER): Payer: Self-pay

## 2021-05-13 ENCOUNTER — Inpatient Hospital Stay (HOSPITAL_COMMUNITY): Payer: BC Managed Care – PPO

## 2021-05-13 ENCOUNTER — Other Ambulatory Visit: Payer: Self-pay

## 2021-05-13 DIAGNOSIS — G4730 Sleep apnea, unspecified: Secondary | ICD-10-CM | POA: Diagnosis present

## 2021-05-13 DIAGNOSIS — Z20822 Contact with and (suspected) exposure to covid-19: Secondary | ICD-10-CM | POA: Diagnosis present

## 2021-05-13 DIAGNOSIS — D72829 Elevated white blood cell count, unspecified: Secondary | ICD-10-CM | POA: Diagnosis present

## 2021-05-13 DIAGNOSIS — N179 Acute kidney failure, unspecified: Secondary | ICD-10-CM | POA: Diagnosis present

## 2021-05-13 DIAGNOSIS — K56609 Unspecified intestinal obstruction, unspecified as to partial versus complete obstruction: Principal | ICD-10-CM | POA: Diagnosis present

## 2021-05-13 DIAGNOSIS — Z886 Allergy status to analgesic agent status: Secondary | ICD-10-CM | POA: Diagnosis not present

## 2021-05-13 DIAGNOSIS — Z0189 Encounter for other specified special examinations: Secondary | ICD-10-CM

## 2021-05-13 DIAGNOSIS — F909 Attention-deficit hyperactivity disorder, unspecified type: Secondary | ICD-10-CM | POA: Diagnosis present

## 2021-05-13 DIAGNOSIS — J302 Other seasonal allergic rhinitis: Secondary | ICD-10-CM | POA: Diagnosis present

## 2021-05-13 DIAGNOSIS — E860 Dehydration: Secondary | ICD-10-CM | POA: Diagnosis present

## 2021-05-13 DIAGNOSIS — Z888 Allergy status to other drugs, medicaments and biological substances status: Secondary | ICD-10-CM

## 2021-05-13 LAB — URINALYSIS, MICROSCOPIC (REFLEX)

## 2021-05-13 LAB — URINALYSIS, ROUTINE W REFLEX MICROSCOPIC
Glucose, UA: NEGATIVE mg/dL
Ketones, ur: NEGATIVE mg/dL
Leukocytes,Ua: NEGATIVE
Nitrite: NEGATIVE
Protein, ur: >300 mg/dL — AB
Specific Gravity, Urine: >=1.03 (ref 1.005–1.030)
pH: 5.5 (ref 5.0–8.0)

## 2021-05-13 LAB — COMPREHENSIVE METABOLIC PANEL
ALT: 61 U/L — ABNORMAL HIGH (ref 0–44)
AST: 51 U/L — ABNORMAL HIGH (ref 15–41)
Albumin: 4.2 g/dL (ref 3.5–5.0)
Alkaline Phosphatase: 52 U/L (ref 38–126)
Anion gap: 14 (ref 5–15)
BUN: 17 mg/dL (ref 6–20)
CO2: 26 mmol/L (ref 22–32)
Calcium: 10 mg/dL (ref 8.9–10.3)
Chloride: 95 mmol/L — ABNORMAL LOW (ref 98–111)
Creatinine, Ser: 1.46 mg/dL — ABNORMAL HIGH (ref 0.61–1.24)
GFR, Estimated: 58 mL/min — ABNORMAL LOW (ref >60)
Glucose, Bld: 161 mg/dL — ABNORMAL HIGH (ref 70–99)
Potassium: 4.1 mmol/L (ref 3.5–5.1)
Sodium: 135 mmol/L (ref 135–145)
Total Bilirubin: 1.3 mg/dL — ABNORMAL HIGH (ref 0.3–1.2)
Total Protein: 8.6 g/dL — ABNORMAL HIGH (ref 6.5–8.1)

## 2021-05-13 LAB — RESP PANEL BY RT-PCR (FLU A&B, COVID) ARPGX2
Influenza A by PCR: NEGATIVE
Influenza B by PCR: NEGATIVE
SARS Coronavirus 2 by RT PCR: NEGATIVE

## 2021-05-13 LAB — CBC
HCT: 59.4 % — ABNORMAL HIGH (ref 39.0–52.0)
Hemoglobin: 19.8 g/dL — ABNORMAL HIGH (ref 13.0–17.0)
MCH: 29.1 pg (ref 26.0–34.0)
MCHC: 33.3 g/dL (ref 30.0–36.0)
MCV: 87.4 fL (ref 80.0–100.0)
Platelets: 351 10*3/uL (ref 150–400)
RBC: 6.8 MIL/uL — ABNORMAL HIGH (ref 4.22–5.81)
RDW: 15.8 % — ABNORMAL HIGH (ref 11.5–15.5)
WBC: 15.7 10*3/uL — ABNORMAL HIGH (ref 4.0–10.5)
nRBC: 0 % (ref 0.0–0.2)

## 2021-05-13 LAB — LIPASE, BLOOD: Lipase: 43 U/L (ref 11–51)

## 2021-05-13 MED ORDER — ONDANSETRON HCL 4 MG/2ML IJ SOLN
4.0000 mg | Freq: Four times a day (QID) | INTRAMUSCULAR | Status: DC | PRN
  Administered 2021-05-13: 4 mg via INTRAVENOUS
  Filled 2021-05-13: qty 2

## 2021-05-13 MED ORDER — DIPHENHYDRAMINE HCL 25 MG PO CAPS: 25.0000 mg | ORAL_CAPSULE | Freq: Four times a day (QID) | ORAL | Status: DC | PRN

## 2021-05-13 MED ORDER — ONDANSETRON HCL 4 MG/2ML IJ SOLN
4.0000 mg | Freq: Once | INTRAMUSCULAR | Status: AC
  Administered 2021-05-13: 4 mg via INTRAVENOUS
  Filled 2021-05-13: qty 2

## 2021-05-13 MED ORDER — DIATRIZOATE MEGLUMINE & SODIUM 66-10 % PO SOLN
90.0000 mL | Freq: Once | ORAL | Status: AC
  Administered 2021-05-13: 90 mL via NASOGASTRIC
  Filled 2021-05-13: qty 90

## 2021-05-13 MED ORDER — FENTANYL CITRATE PF 50 MCG/ML IJ SOSY
50.0000 ug | PREFILLED_SYRINGE | INTRAMUSCULAR | Status: DC | PRN
  Administered 2021-05-13: 50 ug via INTRAVENOUS
  Filled 2021-05-13: qty 1

## 2021-05-13 MED ORDER — HYDROMORPHONE HCL 1 MG/ML IJ SOLN
0.5000 mg | INTRAMUSCULAR | Status: DC | PRN
  Administered 2021-05-13 (×3): 1 mg via INTRAVENOUS
  Filled 2021-05-13 (×3): qty 1

## 2021-05-13 MED ORDER — KCL IN DEXTROSE-NACL 20-5-0.45 MEQ/L-%-% IV SOLN
INTRAVENOUS | Status: DC
  Filled 2021-05-13 (×7): qty 1000

## 2021-05-13 MED ORDER — DIPHENHYDRAMINE HCL 50 MG/ML IJ SOLN: 25.0000 mg | Freq: Four times a day (QID) | INTRAMUSCULAR | Status: DC | PRN

## 2021-05-13 MED ORDER — HYDRALAZINE HCL 20 MG/ML IJ SOLN: 10.0000 mg | INTRAMUSCULAR | Status: DC | PRN

## 2021-05-13 MED ORDER — METOPROLOL TARTRATE 5 MG/5ML IV SOLN: 5.0000 mg | Freq: Four times a day (QID) | INTRAVENOUS | Status: DC | PRN

## 2021-05-13 MED ORDER — SODIUM CHLORIDE 0.9 % IV SOLN: INTRAVENOUS | Status: DC

## 2021-05-13 MED ORDER — HYDROMORPHONE HCL 1 MG/ML IJ SOLN
1.0000 mg | Freq: Once | INTRAMUSCULAR | Status: AC
  Administered 2021-05-13: 1 mg via INTRAVENOUS
  Filled 2021-05-13: qty 1

## 2021-05-13 MED ORDER — ENOXAPARIN SODIUM 40 MG/0.4ML IJ SOSY
40.0000 mg | PREFILLED_SYRINGE | INTRAMUSCULAR | Status: DC
  Administered 2021-05-13 – 2021-05-15 (×3): 40 mg via SUBCUTANEOUS
  Filled 2021-05-13 (×3): qty 0.4

## 2021-05-13 MED ORDER — LIDOCAINE HCL URETHRAL/MUCOSAL 2 % EX GEL: 1.0000 "application " | Freq: Once | CUTANEOUS | Status: DC

## 2021-05-13 MED ORDER — ONDANSETRON 4 MG PO TBDP: 4.0000 mg | ORAL_TABLET | Freq: Four times a day (QID) | ORAL | Status: DC | PRN

## 2021-05-13 MED ORDER — PHENOL 1.4 % MT LIQD
1.0000 | OROMUCOSAL | Status: DC | PRN
  Filled 2021-05-13: qty 177

## 2021-05-13 MED ORDER — HYDROMORPHONE HCL 1 MG/ML IJ SOLN
1.0000 mg | Freq: Once | INTRAMUSCULAR | Status: AC
Start: 2021-05-13 — End: 2021-05-13
  Administered 2021-05-13: 1 mg via INTRAVENOUS
  Filled 2021-05-13: qty 1

## 2021-05-13 MED ORDER — ACETAMINOPHEN 500 MG PO TABS: 1000.0000 mg | ORAL_TABLET | Freq: Four times a day (QID) | ORAL | Status: DC | PRN

## 2021-05-13 MED ORDER — IOHEXOL 300 MG/ML  SOLN
100.0000 mL | Freq: Once | INTRAMUSCULAR | Status: AC | PRN
  Administered 2021-05-13: 100 mL via INTRAVENOUS

## 2021-05-13 MED ORDER — PANTOPRAZOLE SODIUM 40 MG IV SOLR
40.0000 mg | Freq: Every day | INTRAVENOUS | Status: DC
  Administered 2021-05-13 – 2021-05-15 (×3): 40 mg via INTRAVENOUS
  Filled 2021-05-13 (×3): qty 10

## 2021-05-13 NOTE — ED Triage Notes (Signed)
Pt arrives with c/o pain to abdomen states that history of bowel blockage states that this feels the same.Last BM yesterday morning. Pain started yesterday afternoon. Pt had one episode of vomiting PTA to ED. ?

## 2021-05-13 NOTE — ED Notes (Signed)
Pt vomited large amount emesis.  No blood noted ?

## 2021-05-13 NOTE — H&P (Signed)
Jose Shaffer [birth year]  [id number].    Chief Complaint/Reason for Consult: SBO  HPI:  This is an otherwise healthy 51 year old male who underwent an umbilical hernia repair with mesh by Dr. [name] several years ago.  He unfortunately has had a persistent complication with a draining wound at his umbilicus secondary to a stitch.  This stitch was removed and the area had silver nitrate treatments multiple times.  He has continued to intermittently have drainage from his umbilicus.  The last time was in November of this year but this has since dried up.  He was scheduled to undergo surgery to correct this problem but given its resolution this was canceled by the patient.  The patient has also had multiple admissions secondary to small bowel obstructions since his prior umbilical hernia repair years ago.  I see he has had at least 4 if not more admissions for recurrent episodes.  His most recent admission was in mid 2022 as well as November 2022.  Each time these episodes seem to resolve on their own with conservative management.  The patient states yesterday he began to have similar discomfort in his abdomen as previous times.  He has not passed any flatus and his last bowel movement was yesterday.  He admits to nausea and emesis.  He denies any blood in his emesis.  He denies any fevers.  He presented to [facility] for evaluation.  He was found to have a mild AKI with a creatinine of 1.46 as well as a slight leukocytosis of 15,000.  He underwent a CT scan that revealed a small bowel obstruction with a transition near the umbilicus.  He was transferred to [facility] for our evaluation and management.  ROS: ROS: Please see HPI, otherwise all other systems have been reviewed and are negative.  Family History  Problem Relation Age of Onset   Hypertension Father    Colon polyps Father    Cancer Maternal Grandmother 43       breast cancer   Hypertension Paternal  Grandmother    COPD Paternal Grandfather        lung cancer; tobacco use, Doctor, general practice   Cancer Maternal Aunt        breast cancer, late 50's   Breast cancer Maternal Aunt    Cancer Maternal Aunt        breast cancer; diagnosed late 50's   Breast cancer Maternal Aunt    Colon cancer Neg Hx    Rectal cancer Neg Hx    Stomach cancer Neg Hx    Esophageal cancer Neg Hx     Past Medical History:  Diagnosis Date   ADD (attention deficit disorder)    Allergy    Anxiety state, unspecified 03/10/2013   Diverticulitis    Insomnia    Long-term current use of testosterone replacement therapy    Sleep apnea     Past Surgical History:  Procedure Laterality Date   HERNIA REPAIR N/A    Phreesia 05/24/2020   INGUINAL HERNIA REPAIR Bilateral 01/22/2015   Procedure: LAPAROSCOPIC BILATERAL INGUINAL HERNIA REPAIR WITH MESH AND UMBILICAL HERNIA REPAIR;  Surgeon: Abigail Miyamoto, MD;  Location: [facility];  Service: General;  Laterality: Bilateral;   QUADRICEPS TENDON REPAIR  08/2008   UMBILICAL HERNIA REPAIR N/A 01/22/2015   Procedure: UMBILICAL HERNIA REPAIR;  Surgeon: Abigail Miyamoto, MD;  Location: [facility];  Service: General;  Laterality: N/A;    Social History:  reports that he has never smoked. He has never used smokeless tobacco. He reports current alcohol use. He reports current drug use. Drug: Marijuana.  Allergies:  Allergies  Allergen Reactions   Other Hives, Rash and Swelling   Ibuprofen Other (See Comments)    Per patient feels like KNOT in throat and itching with rash   Tramadol Anxiety    (Not in a hospital admission)    Physical Exam: Blood pressure (!) 164/94, pulse 92, temperature 98.2 F (36.8 C), temperature source Oral, resp. rate 17, height 5\' 10"  (1.778 m), weight 111.1 kg, SpO2 94 %. General: pleasant, WD, WN white male who is laying in bed in NAD HEENT: head is normocephalic, atraumatic.  Sclera are noninjected.  PERRL.   Ears and nose without any masses or lesions.  Mouth is pink and moist Heart: regular, rate, and rhythm.  Normal s1,s2. No obvious murmurs, gallops, or rubs noted.  Palpable radial and pedal pulses bilaterally Lungs: CTAB, no wheezes, rhonchi, or rales noted.  Respiratory effort nonlabored Abd: tender somewhat diffusely, distended but still somewhat soft, +BS, no masses, hernias, or organomegaly.  He does have what appears to be a minimal amount of dried drainage at his umbilicus. MS: all 4 extremities are symmetrical with no cyanosis, clubbing, or edema. Skin: warm and dry with no masses, lesions, or rashes Neuro: Cranial nerves 2-12 grossly intact, sensation is normal throughout Psych: A&Ox3 with an appropriate affect.   Results for orders placed or performed during the hospital encounter of 05/13/21 (from the past 48 hour(s))  Lipase, blood     Status: None   Collection Time: 05/13/21  8:31 AM  Result Value Ref Range   Lipase 43 11 - 51 U/L    Comment: Performed at [facility], [address] [place], [place]  Comprehensive metabolic panel     Status: Abnormal   Collection Time: 05/13/21  8:31 AM  Result Value Ref Range   Sodium 135 135 - 145 mmol/L   Potassium 4.1 3.5 - 5.1 mmol/L   Chloride 95 (L) 98 - 111 mmol/L   CO2 26 22 - 32 mmol/L   Glucose, Bld 161 (H) 70 - 99 mg/dL    Comment: Glucose reference range applies only to samples taken after fasting for at least 8 hours.   BUN 17 6 - 20 mg/dL   Creatinine, Ser 07/13/21 (H) 0.61 - 1.24 mg/dL   Calcium 2.45 8.9 - 80.9 mg/dL   Total Protein 8.6 (H) 6.5 - 8.1 g/dL   Albumin 4.2 3.5 - 5.0 g/dL   AST 51 (H) 15 - 41 U/L   ALT 61 (H) 0 - 44 U/L   Alkaline Phosphatase 52 38 - 126 U/L   Total Bilirubin 1.3 (H) 0.3 - 1.2 mg/dL   GFR, Estimated 58 (L) >60 mL/min    Comment: (NOTE) Calculated using the CKD-EPI Creatinine Equation (2021)    Anion gap 14 5 - 15    Comment: Performed at [facility], [address] [place], [place]  CBC     Status: Abnormal   Collection Time: 05/13/21  8:31 AM  Result Value Ref Range   WBC 15.7 (H) 4.0 - 10.5 K/uL   RBC 6.80 (H) 4.22 - 5.81 MIL/uL   Hemoglobin 19.8 (H) 13.0 - 17.0 g/dL   HCT 07/13/21 (H) 53.9 - 76.7 %   MCV 87.4 80.0 - 100.0 fL   MCH 29.1 26.0 - 34.0  pg   MCHC 33.3 30.0 - 36.0 g/dL   RDW 16.115.8 (H) 09.611.5 - 04.515.5 %   Platelets 351 150 - 400 K/uL   nRBC 0.0 0.0 - 0.2 %    Comment: Performed at [facility] [place], [address] [place], [place] [address]  Urinalysis, Routine w reflex microscopic Urine, Clean Catch     Status: Abnormal   Collection Time: 05/13/21  8:57 AM  Result Value Ref Range   Color, Urine AMBER (A) YELLOW    Comment: BIOCHEMICALS MAY BE AFFECTED BY COLOR   APPearance CLOUDY (A) CLEAR   Specific Gravity, Urine >=1.030 1.005 - 1.030   pH 5.5 5.0 - 8.0   Glucose, UA NEGATIVE NEGATIVE mg/dL   Hgb urine dipstick MODERATE (A) NEGATIVE   Bilirubin Urine SMALL (A) NEGATIVE   Ketones, ur NEGATIVE NEGATIVE mg/dL   Protein, ur >191>300 (A) NEGATIVE mg/dL   Nitrite NEGATIVE NEGATIVE   Leukocytes,Ua NEGATIVE NEGATIVE    Comment: Performed at [facility] [place], [address] [place], [place] [address]  Urinalysis, Microscopic (reflex)     Status: Abnormal   Collection Time: 05/13/21  8:57 AM  Result Value Ref Range   RBC / HPF 6-10 0 - 5 RBC/hpf   WBC, UA 6-10 0 - 5 WBC/hpf   Bacteria, UA FEW (A) NONE SEEN   Squamous Epithelial / LPF 0-5 0 - 5   Mucus PRESENT    Hyaline Casts, UA PRESENT    Granular Casts, UA PRESENT    Amorphous Crystal PRESENT     Comment: Performed at [facility] [place], [address] [place], [place] [address]  Resp Panel by RT-PCR (Flu A&B, Covid) Nasopharyngeal Swab     Status: None   Collection Time: 05/13/21 10:58 AM   Specimen: Nasopharyngeal Swab; Nasopharyngeal(NP) swabs in vial transport medium  Result Value Ref Range   SARS Coronavirus 2 by RT PCR NEGATIVE NEGATIVE     Comment: (NOTE) SARS-CoV-2 target nucleic acids are NOT DETECTED.  The SARS-CoV-2 RNA is generally detectable in upper respiratory specimens during the acute phase of infection. The lowest concentration of SARS-CoV-2 viral copies this assay can detect is 138 copies/mL. A negative result does not preclude SARS-Cov-2 infection and should not be used as the sole basis for treatment or other patient management decisions. A negative result may occur with  improper specimen collection/handling, submission of specimen other than nasopharyngeal swab, presence of viral mutation(s) within the areas targeted by this assay, and inadequate number of viral copies(<138 copies/mL). A negative result must be combined with clinical observations, patient history, and epidemiological information. The expected result is Negative.  Fact Sheet for Patients:  BloggerCourse.comhttps://www.fda.gov/media/152166/download  Fact Sheet for Healthcare Providers:  SeriousBroker.ithttps://www.fda.gov/media/152162/download  This test is no t yet approved or cleared by the [place] FDA and  has been authorized for detection and/or diagnosis of SARS-CoV-2 by FDA under an Emergency Use Authorization (EUA). This EUA will remain  in effect (meaning this test can be used) for the duration of the COVID-19 declaration under Section 564(b)(1) of the Act, 21 U.S.C.section 360bbb-3(b)(1), unless the authorization is terminated  or revoked sooner.       Influenza A by PCR NEGATIVE NEGATIVE   Influenza B by PCR NEGATIVE NEGATIVE    Comment: (NOTE) The Xpert Xpress SARS-CoV-2/FLU/RSV plus assay is intended as an aid in the diagnosis of influenza from Nasopharyngeal swab specimens and should not be used as a sole basis for  treatment. Nasal washings and aspirates are unacceptable for Xpert Xpress SARS-CoV-2/FLU/RSV testing.  Fact Sheet for Patients: BloggerCourse.comhttps://www.fda.gov/media/152166/download  Fact Sheet for Healthcare  Providers: SeriousBroker.ithttps://www.fda.gov/media/152162/download  This test is not yet approved or cleared by the [place] FDA and has been authorized for detection and/or diagnosis of SARS-CoV-2 by FDA under an Emergency Use Authorization (EUA). This EUA will remain in effect (meaning this test can be used) for the duration of the COVID-19 declaration under Section 564(b)(1) of the Act, 21 U.S.C. section 360bbb-3(b)(1), unless the authorization is terminated or revoked.  Performed at [facility], [address] [place], [place] [address]    CT Abdomen Pelvis W Contrast  Result Date: 05/13/2021 CLINICAL DATA:  Abdominal pain with nausea and vomiting since yesterday. EXAM: CT ABDOMEN AND PELVIS WITH CONTRAST TECHNIQUE: Multidetector CT imaging of the abdomen and pelvis was performed using the standard protocol following bolus administration of intravenous contrast. RADIATION DOSE REDUCTION: This exam was performed according to the departmental dose-optimization program which includes automated exposure control, adjustment of the mA and/or kV according to patient size and/or use of iterative reconstruction technique. CONTRAST:  100mL OMNIPAQUE IOHEXOL 300 MG/ML  SOLN COMPARISON:  CT abdomen pelvis dated February 03, 2021. FINDINGS: Lower chest: No acute abnormality. Hepatobiliary: No focal liver abnormality is seen. No gallstones, gallbladder wall thickening, or biliary dilatation. Pancreas: Unremarkable. No pancreatic ductal dilatation or surrounding inflammatory changes. Spleen: Normal in size without focal abnormality. Adrenals/Urinary Tract: Adrenal glands are unremarkable. Kidneys are normal, without renal calculi, focal lesion, or hydronephrosis. Bladder is unremarkable. Stomach/Bowel: Unchanged small hiatal hernia. The stomach is otherwise within normal limits. Multiple dilated loops of mid small bowel with transition point in the mid anterior abdomen near the umbilicus and abdominal wall  diastasis, similar to prior study. No bowel wall thickening or surrounding inflammatory change. Proximal and distal small bowel are decompressed. Extensive left-sided colonic diverticulosis again noted. Normal appendix. Vascular/Lymphatic: No significant vascular findings are present. No enlarged abdominal or pelvic lymph nodes. Reproductive: Prostate is unremarkable. Other: Unchanged small bilateral fat containing inguinal hernias. The right testicle remains in the inguinal canal. No free fluid or pneumoperitoneum. Musculoskeletal: No acute or significant osseous findings. IMPRESSION: 1. Adhesion related high-grade small bowel obstruction with transition point in the mid anterior abdomen near the umbilicus and abdominal wall diastasis, similar to prior study. Electronically Signed   By: Obie DredgeWilliam T Derry M.D.   On: 05/13/2021 09:56      Assessment/Plan Small bowel obstruction The patient has been seen, imaging, labs, I/O's, vitals, EDP notes have been reviewed as well as previous admission notes and notes from our office.  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  The patient will likely require some type of surgical intervention at some point given his recurrent obstructions as well as the recurrent drainage situation; however, it would be ideal given the amount of bowel dilatation that is currently present if we can treat him conservatively and get him over this episode prior to intervention.  We will place an NG tube and start with the small bowel obstruction protocol.  If the patient does not resolve with conservative management he may require a laparotomy for definitive fixation.  This has been discussed with the patient and he understands and agrees.   FEN -n.p.o./NG tube/IV fluids VTE -Lovenox ID -none needed Admit -inpatient  AKI -likely secondary to dehydration  from bowel obstruction.  Will initiate IV fluids and recheck labs in the morning Leukocytosis -no evidence of ischemia or infection currently.  This is likely also secondary to dehydration.  We will follow-up CBC in the morning. Seasonal allergies ADHD -hold meds with NG tube in place.  Will resume once this can be removed.  Moderate Medical Decision Making  Letha Cape, [facility] 05/13/2021, 2:11 PM Please see Amion for pager number during day hours 7:00am-4:30pm or 7:00am -11:30am on weekends

## 2021-05-13 NOTE — ED Provider Notes (Signed)
[facility] EMERGENCY DEPARTMENT Provider Note   CSN: [id number] Arrival date & time: 05/13/21  4431     History  Chief Complaint  Patient presents with   Abdominal Pain    Jose Shaffer is a 51 y.o. male.  Patient with a complaint of left-sided umbilical abdominal pain starting yesterday associated with nausea and vomiting.  No vomiting of any blood.  No diarrhea.  Patient seen for similar findings in November and at that time was admitted for a small bowel obstruction to [facility] long hospital.  According to patient with bowel rest it resolved.  Patient also admitted in June for the same thing.  The patient has a history of small bowel obstruction.  Patient has history of diverticulitis.  Patient has a history of bilateral inguinal hernia repairs in 2016 and umbilical hernia repair in 2016.      Home Medications Prior to Admission medications   Medication Sig Start Date End Date Taking? Authorizing Provider  alfuzosin (UROXATRAL) 10 MG 24 hr tablet Take 10 mg by mouth daily. 07/28/20   [provider]  doxazosin (CARDURA) 8 MG tablet Take 8 mg by mouth every evening. 01/23/21   [provider]  fluticasone (FLONASE) 50 MCG/ACT nasal spray Place 1 spray into both nostrils every evening. 05/27/20   Just, Azalee Course, FNP  guanFACINE (INTUNIV) 2 MG TB24 ER tablet Take 2 mg by mouth daily. 09/11/19   [provider]  Methylphenidate HCl ER, PM, 100 MG CP24 Take 100 mg by mouth at bedtime. 07/27/20   [provider]  montelukast (SINGULAIR) 10 MG tablet TAKE 1 TABLET(10 MG) BY MOUTH AT BEDTIME Patient taking differently: Take 10 mg by mouth at bedtime. 05/27/20   Just, Azalee Course, FNP  naproxen sodium (ALEVE) 220 MG tablet Take 440 mg by mouth daily as needed (pain).    [provider]      Allergies    Other, Ibuprofen, and Tramadol    Review of Systems   Review of Systems  Constitutional:  Negative for chills and fever.  HENT:   Negative for ear pain and sore throat.   Eyes:  Negative for pain and visual disturbance.  Respiratory:  Negative for cough and shortness of breath.   Cardiovascular:  Negative for chest pain and palpitations.  Gastrointestinal:  Positive for abdominal pain, nausea and vomiting.  Genitourinary:  Negative for dysuria and hematuria.  Musculoskeletal:  Negative for arthralgias and back pain.  Skin:  Negative for color change and rash.  Neurological:  Negative for seizures and syncope.  All other systems reviewed and are negative.  Physical Exam Updated Vital Signs BP 137/83    Pulse 88    Temp 97.7 F (36.5 C) (Oral)    Resp 19    Ht 1.778 m (5\' 10" )    Wt 111.1 kg    SpO2 94%    BMI 35.15 kg/m  Physical Exam Vitals and nursing note reviewed.  Constitutional:      General: He is not in acute distress.    Appearance: Normal appearance. He is well-developed.  HENT:     Head: Normocephalic and atraumatic.  Eyes:     Extraocular Movements: Extraocular movements intact.     Conjunctiva/sclera: Conjunctivae normal.     Pupils: Pupils are equal, round, and reactive to light.  Cardiovascular:     Rate and Rhythm: Normal rate and regular rhythm.     Heart sounds: No murmur heard. Pulmonary:  Effort: Pulmonary effort is normal. No respiratory distress.     Breath sounds: Normal breath sounds.  Abdominal:     Palpations: Abdomen is soft.     Tenderness: There is abdominal tenderness.     Comments: Umbilical tenderness.  No mass.  But there is a little bit of discharge from the umbilicus area.  There appears to be a little bloody.  Patient states this has been chronic.  Musculoskeletal:        General: No swelling.     Cervical back: Normal range of motion and neck supple.  Skin:    General: Skin is warm and dry.     Capillary Refill: Capillary refill takes less than 2 seconds.  Neurological:     General: No focal deficit present.     Mental Status: He is alert and oriented to  person, place, and time.  Psychiatric:        Mood and Affect: Mood normal.    ED Results / Procedures / Treatments   Labs (all labs ordered are listed, but only abnormal results are displayed) Labs Reviewed  COMPREHENSIVE METABOLIC PANEL - Abnormal; Notable for the following components:      Result Value   Chloride 95 (*)    Glucose, Bld 161 (*)    Creatinine, Ser 1.46 (*)    Total Protein 8.6 (*)    AST 51 (*)    ALT 61 (*)    Total Bilirubin 1.3 (*)    GFR, Estimated 58 (*)    All other components within normal limits  CBC - Abnormal; Notable for the following components:   WBC 15.7 (*)    RBC 6.80 (*)    Hemoglobin 19.8 (*)    HCT 59.4 (*)    RDW 15.8 (*)    All other components within normal limits  URINALYSIS, ROUTINE W REFLEX MICROSCOPIC - Abnormal; Notable for the following components:   Color, Urine AMBER (*)    APPearance CLOUDY (*)    Hgb urine dipstick MODERATE (*)    Bilirubin Urine SMALL (*)    Protein, ur >300 (*)    All other components within normal limits  URINALYSIS, MICROSCOPIC (REFLEX) - Abnormal; Notable for the following components:   Bacteria, UA FEW (*)    All other components within normal limits  RESP PANEL BY RT-PCR (FLU A&B, COVID) ARPGX2  LIPASE, BLOOD    EKG None  Radiology CT Abdomen Pelvis W Contrast  Result Date: 05/13/2021 CLINICAL DATA:  Abdominal pain with nausea and vomiting since yesterday. EXAM: CT ABDOMEN AND PELVIS WITH CONTRAST TECHNIQUE: Multidetector CT imaging of the abdomen and pelvis was performed using the standard protocol following bolus administration of intravenous contrast. RADIATION DOSE REDUCTION: This exam was performed according to the departmental dose-optimization program which includes automated exposure control, adjustment of the mA and/or kV according to patient size and/or use of iterative reconstruction technique. CONTRAST:  OMNIPAQUE IOHEXOL 300 MG/ML  SOLN COMPARISON:  CT abdomen pelvis dated  February 03, 2021. FINDINGS: Lower chest: No acute abnormality. Hepatobiliary: No focal liver abnormality is seen. No gallstones, gallbladder wall thickening, or biliary dilatation. Pancreas: Unremarkable. No pancreatic ductal dilatation or surrounding inflammatory changes. Spleen: Normal in size without focal abnormality. Adrenals/Urinary Tract: Adrenal glands are unremarkable. Kidneys are normal, without renal calculi, focal lesion, or hydronephrosis. Bladder is unremarkable. Stomach/Bowel: Unchanged small hiatal hernia. The stomach is otherwise within normal limits. Multiple dilated loops of mid small bowel with transition point in the mid  anterior abdomen near the umbilicus and abdominal wall diastasis, similar to prior study. No bowel wall thickening or surrounding inflammatory change. Proximal and distal small bowel are decompressed. Extensive left-sided colonic diverticulosis again noted. Normal appendix. Vascular/Lymphatic: No significant vascular findings are present. No enlarged abdominal or pelvic lymph nodes. Reproductive: Prostate is unremarkable. Other: Unchanged small bilateral fat containing inguinal hernias. The right testicle remains in the inguinal canal. No free fluid or pneumoperitoneum. Musculoskeletal: No acute or significant osseous findings. IMPRESSION: 1. Adhesion related high-grade small bowel obstruction with transition point in the mid anterior abdomen near the umbilicus and abdominal wall diastasis, similar to prior study. Electronically Signed   By: Obie Dredge M.D.   On: 05/13/2021 09:56    Procedures Procedures    Medications Ordered in ED Medications  fentaNYL (SUBLIMAZE) injection 50 mcg (50 mcg Intravenous Given 05/13/21 0918)  0.9 %  sodium chloride infusion ( Intravenous New Bag/Given 05/13/21 0921)  ondansetron (ZOFRAN) injection 4 mg (4 mg Intravenous Not Given 05/13/21 0927)  lidocaine (XYLOCAINE) 2 % jelly 1 application (has no administration in time range)   ondansetron (ZOFRAN) injection 4 mg (4 mg Intravenous Given 05/13/21 0918)  HYDROmorphone (DILAUDID) injection 1 mg (1 mg Intravenous Given 05/13/21 0926)  iohexol (OMNIPAQUE) 300 MG/ML solution 100 mL (100 mLs Intravenous Contrast Given 05/13/21 0934)    ED Course/ Medical Decision Making/ A&P                           Medical Decision Making Amount and/or Complexity of Data Reviewed Labs: ordered. Radiology: ordered.  Risk Prescription drug management. Decision regarding hospitalization.   Patient's symptoms very concerning for recurrent bowel obstruction.  Labs significant for marked leukocytosis.  Urinalysis not consistent with urinary tract infection.  Complete metabolic panel does have some abnormal liver function test total bili is 1.3.  AST ALT is up a little bit 5161.  GFR 58.  Glucose 161.  CO2 is normal at 26.  We will get CT scan of the abdomen pelvis to evaluate further.  High suspicion for recurrent bowel obstruction.  CT scan consistent with small bowel obstruction high-grade.  Actually the transition point is in the same place as it was on the CAT scan from November.  Discussed with Dr. Carolynne Edouard on-call at [facility].  He wants him transferred into the ED at [facility] long for him to be called so that he can can see the patient.  Made no mention of NG tube at this time.  Patient talks that the umbilical area that is oozing a little bit of blood is chronic.  States that he had a stitch removed there in the past.  It never really completely healed.  Discussed with Dr. Rodena Medin emergency physician at [facility] he will accept the patient in transfer ED to ED for Dr. Carolynne Edouard to see the patient.  Most likely general surgery will admit the patient.   Final Clinical Impression(s) / ED Diagnoses Final diagnoses:  SBO (small bowel obstruction) (HCC)    Rx / DC Orders ED Discharge Orders     None         Vanetta Mulders, MD 05/13/21 1057

## 2021-05-14 ENCOUNTER — Inpatient Hospital Stay (HOSPITAL_COMMUNITY): Payer: BC Managed Care – PPO

## 2021-05-14 LAB — CBC
HCT: 56.1 % — ABNORMAL HIGH (ref 39.0–52.0)
Hemoglobin: 18.4 g/dL — ABNORMAL HIGH (ref 13.0–17.0)
MCH: 28.7 pg (ref 26.0–34.0)
MCHC: 32.8 g/dL (ref 30.0–36.0)
MCV: 87.5 fL (ref 80.0–100.0)
Platelets: 328 10*3/uL (ref 150–400)
RBC: 6.41 MIL/uL — ABNORMAL HIGH (ref 4.22–5.81)
RDW: 15.8 % — ABNORMAL HIGH (ref 11.5–15.5)
WBC: 15.3 10*3/uL — ABNORMAL HIGH (ref 4.0–10.5)
nRBC: 0 % (ref 0.0–0.2)

## 2021-05-14 LAB — BASIC METABOLIC PANEL
Anion gap: 9 (ref 5–15)
BUN: 16 mg/dL (ref 6–20)
CO2: 24 mmol/L (ref 22–32)
Calcium: 8.2 mg/dL — ABNORMAL LOW (ref 8.9–10.3)
Chloride: 101 mmol/L (ref 98–111)
Creatinine, Ser: 1.15 mg/dL (ref 0.61–1.24)
GFR, Estimated: >60 mL/min (ref >60)
Glucose, Bld: 133 mg/dL — ABNORMAL HIGH (ref 70–99)
Potassium: 4.2 mmol/L (ref 3.5–5.1)
Sodium: 134 mmol/L — ABNORMAL LOW (ref 135–145)

## 2021-05-14 MED ORDER — LORAZEPAM 2 MG/ML IJ SOLN
0.5000 mg | Freq: Four times a day (QID) | INTRAMUSCULAR | Status: DC | PRN
  Administered 2021-05-14 – 2021-05-15 (×3): 0.5 mg via INTRAVENOUS
  Filled 2021-05-14 (×3): qty 1

## 2021-05-14 MED ORDER — SODIUM CHLORIDE 0.9 % IV SOLN
12.5000 mg | Freq: Four times a day (QID) | INTRAVENOUS | Status: DC | PRN
  Administered 2021-05-14 (×2): 12.5 mg via INTRAVENOUS
  Filled 2021-05-14 (×2): qty 0.5

## 2021-05-14 MED ORDER — ACETAMINOPHEN 10 MG/ML IV SOLN
1000.0000 mg | Freq: Four times a day (QID) | INTRAVENOUS | Status: AC | PRN
  Administered 2021-05-14: 1000 mg via INTRAVENOUS
  Filled 2021-05-14: qty 100

## 2021-05-14 NOTE — Progress Notes (Signed)
Transition of Care (TOC) Screening Note ? ?Patient Details  ?Name: Jose Shaffer ?Date of Birth: 17-May-1970 ? ?Transition of Care (TOC) CM/SW Contact:    ?Ewing Schlein, LCSW ?Phone Number: ?05/14/2021, 11:00 AM ? ?Transition of Care Department [facility]) has reviewed patient and no TOC needs have been identified at this time. We will continue to monitor patient advancement through interdisciplinary progression rounds. If new patient transition needs arise, please place a TOC consult. ?

## 2021-05-14 NOTE — Progress Notes (Signed)
? ? ?   ?Subjective: ?Feels much better today regarding his abdomen.  Didn't sleep well secondary to NGT.  Small BM and some flatus. ? ?ROS: See above, otherwise other systems negative ? ?Objective: ?Vital signs in last 24 hours: ?Temp:  [98 ?F (36.7 ?C)-98.9 ?F (37.2 ?C)] 98.9 ?F (37.2 ?C) (03/03 [id number]) ?Pulse Rate:  [84-110] 93 (03/03 0558) ?Resp:  [8-25] 18 (03/03 0558) ?BP: (133-201)/(81-124) 133/90 (03/03 0558) ?SpO2:  [87 %-100 %] 96 % (03/03 0558) ?Last BM Date : 05/14/21 ? ?Intake/Output from previous day: ?03/02 0701 - 03/03 0700 ?In: 1667.3 [I.V.:1667.3] ?Out: 1725 [Urine:525; Emesis/NG output:1200] ?Intake/Output this shift: ?No intake/output data recorded. ? ?PE: ?Gen: NAD ?Abd: much softer than yesterday, NGT with 1200cc of bilious output documented.  NT, except small amount chronically at umbilicus ? ?Lab Results:  ?Recent Labs  ?  05/13/21 ?[id number] 05/14/21 ?0405  ?WBC 15.7* 15.3*  ?HGB 19.8* 18.4*  ?HCT 59.4* 56.1*  ?PLT 351 328  ? ?BMET ?Recent Labs  ?  05/13/21 ?[id number] 05/14/21 ?0405  ?NA 135 134*  ?K 4.1 4.2  ?CL 95* 101  ?CO2 26 24  ?GLUCOSE 161* 133*  ?BUN 17 16  ?CREATININE 1.46* 1.15  ?CALCIUM 10.0 8.2*  ? ?PT/INR ?No results for input(s): LABPROT, INR in the last 72 hours. ?CMP  ?   ?Component Value Date/Time  ? NA 134 (L) 05/14/2021 0405  ? NA 139 05/15/2019 0812  ? K 4.2 05/14/2021 0405  ? CL 101 05/14/2021 0405  ? CO2 24 05/14/2021 0405  ? GLUCOSE 133 (H) 05/14/2021 0405  ? BUN 16 05/14/2021 0405  ? BUN 17 05/15/2019 0812  ? CREATININE 1.15 05/14/2021 0405  ? CREATININE 1.31 11/12/2014 1928  ? CALCIUM 8.2 (L) 05/14/2021 0405  ? PROT 8.6 (H) 05/13/2021 0831  ? PROT 6.6 05/15/2019 0812  ? ALBUMIN 4.2 05/13/2021 0831  ? ALBUMIN 4.4 05/15/2019 0812  ? AST 51 (H) 05/13/2021 0831  ? ALT 61 (H) 05/13/2021 0831  ? ALKPHOS 52 05/13/2021 0831  ? BILITOT 1.3 (H) 05/13/2021 0831  ? BILITOT 0.5 05/15/2019 0812  ? GFRNONAA >60 05/14/2021 0405  ? GFRAA 77 05/15/2019 0812  ? ?Lipase  ?   ?Component Value  Date/Time  ? LIPASE 43 05/13/2021 0831  ? ? ? ? ? ?Studies/Results: ?CT Abdomen Pelvis W Contrast ? ?Result Date: 05/13/2021 ?CLINICAL DATA:  Abdominal pain with nausea and vomiting since yesterday. EXAM: CT ABDOMEN AND PELVIS WITH CONTRAST TECHNIQUE: Multidetector CT imaging of the abdomen and pelvis was performed using the standard protocol following bolus administration of intravenous contrast. RADIATION DOSE REDUCTION: This exam was performed according to the departmental dose-optimization program which includes automated exposure control, adjustment of the mA and/or kV according to patient size and/or use of iterative reconstruction technique. CONTRAST:  17mL OMNIPAQUE IOHEXOL 300 MG/ML  SOLN COMPARISON:  CT abdomen pelvis dated February 03, 2021. FINDINGS: Lower chest: No acute abnormality. Hepatobiliary: No focal liver abnormality is seen. No gallstones, gallbladder wall thickening, or biliary dilatation. Pancreas: Unremarkable. No pancreatic ductal dilatation or surrounding inflammatory changes. Spleen: Normal in size without focal abnormality. Adrenals/Urinary Tract: Adrenal glands are unremarkable. Kidneys are normal, without renal calculi, focal lesion, or hydronephrosis. Bladder is unremarkable. Stomach/Bowel: Unchanged small hiatal hernia. The stomach is otherwise within normal limits. Multiple dilated loops of mid small bowel with transition point in the mid anterior abdomen near the umbilicus and abdominal wall diastasis, similar to prior study. No bowel wall thickening or surrounding inflammatory change. Proximal  and distal small bowel are decompressed. Extensive left-sided colonic diverticulosis again noted. Normal appendix. Vascular/Lymphatic: No significant vascular findings are present. No enlarged abdominal or pelvic lymph nodes. Reproductive: Prostate is unremarkable. Other: Unchanged small bilateral fat containing inguinal hernias. The right testicle remains in the inguinal canal. No free fluid  or pneumoperitoneum. Musculoskeletal: No acute or significant osseous findings. IMPRESSION: 1. Adhesion related high-grade small bowel obstruction with transition point in the mid anterior abdomen near the umbilicus and abdominal wall diastasis, similar to prior study. Electronically Signed   By: Titus Dubin M.D.   On: 05/13/2021 09:56  ? ?DG Abd Portable 1V-Small Bowel Obstruction Protocol-initial, 8 hr delay ? ?Result Date: 05/14/2021 ?CLINICAL DATA:  Small-bowel obstruction EXAM: PORTABLE ABDOMEN - 1 VIEW COMPARISON:  None. FINDINGS: Gas distended bowel throughout the visualized abdomen. There is contrast material within the gastric fundus. No contrast material visible outside of the stomach. IMPRESSION: 1. Gas distended bowel throughout the visualized abdomen. 2. Contrast material within the gastric fundus. Electronically Signed   By: Ulyses Jarred M.D.   On: 05/14/2021 00:42  ? ?DG Abd Portable 1V-Small Bowel Protocol-Position Verification ? ?Result Date: 05/13/2021 ?CLINICAL DATA:  NG placement EXAM: PORTABLE ABDOMEN - 1 VIEW COMPARISON:  02/03/2021 FINDINGS: Esophagogastric tube is positioned with tip below the diaphragm, side port at the level of gastroesophageal junction. Gas and fluid distended loops bowel in the central upper abdomen measuring up to 5.0 cm in caliber. Scattered gas and stool present in the included colon. No obvious free air on single semi-erect radiograph. IMPRESSION: 1. Esophagogastric tube is positioned with tip below the diaphragm, side port at the level of gastroesophageal junction. Recommend slight advancement to ensure subdiaphragmatic positioning of both tip and side port. 2. Gas and fluid distended loops bowel in the central upper abdomen measuring up to 5.0 cm in caliber, consistent with small bowel obstruction. Electronically Signed   By: Delanna Ahmadi M.D.   On: 05/13/2021 15:00   ? ?Anti-infectives: ?Anti-infectives (From admission, onward)  ? ? None  ? ?   ? ? ? ?Assessment/Plan ?Small bowel obstruction ?-repeat films show air and stool in colon, but film this morning still with quite a bit of dilated central small bowel c/w site of obstruction on his CT scan at his umbilicus ?-don't think he is ready for NGT to be removed.  Will discuss with Dr. Marlou Starks ?-mobilize/pulm toilet ?  ?FEN -n.p.o./NG tube/IV fluids ?VTE -Lovenox ?ID -none needed ?Admit -inpatient ?  ?AKI - resolved ?Leukocytosis - still 15K today despite hydration.  Follow.  No signs of infection or ischemia ?Seasonal allergies ?ADHD -hold meds with NG tube in place.  Will resume once this can be removed. ? ?Moderate Medical Decision Making ? LOS: 1 day  ? ? ?Henreitta Cea , PA-C ?[facility] ?05/14/2021, 9:25 AM ?Please see Amion for pager number during day hours 7:00am-4:30pm or 7:00am -11:30am on weekends ? ?

## 2021-05-14 NOTE — Progress Notes (Signed)
?   05/14/21 1432  ?Assess: MEWS Score  ?Temp (!) 100.5 ?F (38.1 ?C) ?(RN notified)  ?BP (!) 159/90  ?Pulse Rate (!) 118  ?Resp 18  ?SpO2 97 %  ?O2 Device Room Air  ?Assess: MEWS Score  ?MEWS Temp 1  ?MEWS Systolic 0  ?MEWS Pulse 2  ?MEWS RR 0  ?MEWS LOC 0  ?MEWS Score 3  ?MEWS Score Color Yellow  ?Assess: if the MEWS score is Yellow or Red  ?Were vital signs taken at a resting state? Yes  ?Focused Assessment No change from prior assessment  ?Does the patient meet 2 or more of the SIRS criteria? No  ?MEWS guidelines implemented *See Row Information* Yes  ?Treat  ?MEWS Interventions Administered scheduled meds/treatments ?(iv tylenol)  ?Take Vital Signs  ?Increase Vital Sign Frequency  Yellow: Q 2hr X 2 then Q 4hr X 2, if remains yellow, continue Q 4hrs  ?Escalate  ?MEWS: Escalate Yellow: discuss with charge nurse/RN and consider discussing with provider and RRT  ?Notify: Charge Nurse/RN  ?Name of Charge Nurse/RN Notified Darl Pikes, RN  ?Date Charge Nurse/RN Notified 05/14/21  ?Time Charge Nurse/RN Notified 1445  ?Notify: Provider  ?Provider Name/Title Unknown Jim  ?Date Provider Notified 05/14/21  ?Time Provider Notified 1445  ?Notification Type Page  ?Notification Reason Change in status;Critical result  ?Provider response See new orders  ?Date of Provider Response 05/14/21  ?Time of Provider Response 1446  ?Document  ?Patient Outcome Other (Comment)  ?Progress note created (see row info) Yes  ?Assess: SIRS CRITERIA  ?SIRS Temperature  0  ?SIRS Pulse 1  ?SIRS Respirations  0  ?SIRS WBC 0  ?SIRS Score Sum  1  ? ? ?

## 2021-05-15 ENCOUNTER — Inpatient Hospital Stay (HOSPITAL_COMMUNITY): Payer: BC Managed Care – PPO

## 2021-05-15 LAB — BASIC METABOLIC PANEL
Anion gap: 7 (ref 5–15)
BUN: 12 mg/dL (ref 6–20)
CO2: 24 mmol/L (ref 22–32)
Calcium: 8.1 mg/dL — ABNORMAL LOW (ref 8.9–10.3)
Chloride: 100 mmol/L (ref 98–111)
Creatinine, Ser: 1.11 mg/dL (ref 0.61–1.24)
GFR, Estimated: >60 mL/min (ref >60)
Glucose, Bld: 119 mg/dL — ABNORMAL HIGH (ref 70–99)
Potassium: 3.8 mmol/L (ref 3.5–5.1)
Sodium: 131 mmol/L — ABNORMAL LOW (ref 135–145)

## 2021-05-15 LAB — CBC
HCT: 51.6 % (ref 39.0–52.0)
Hemoglobin: 17 g/dL (ref 13.0–17.0)
MCH: 28.8 pg (ref 26.0–34.0)
MCHC: 32.9 g/dL (ref 30.0–36.0)
MCV: 87.5 fL (ref 80.0–100.0)
Platelets: 300 10*3/uL (ref 150–400)
RBC: 5.9 MIL/uL — ABNORMAL HIGH (ref 4.22–5.81)
RDW: 14.4 % (ref 11.5–15.5)
WBC: 14.7 10*3/uL — ABNORMAL HIGH (ref 4.0–10.5)
nRBC: 0 % (ref 0.0–0.2)

## 2021-05-15 MED ORDER — METHYLPHENIDATE HCL ER (OSM) 27 MG PO TBCR
54.0000 mg | EXTENDED_RELEASE_TABLET | Freq: Every morning | ORAL | Status: DC
  Filled 2021-05-15: qty 2

## 2021-05-15 MED ORDER — DM-GUAIFENESIN ER 30-600 MG PO TB12
1.0000 | ORAL_TABLET | Freq: Two times a day (BID) | ORAL | Status: DC
  Administered 2021-05-15 – 2021-05-16 (×2): 1 via ORAL
  Filled 2021-05-15 (×2): qty 1

## 2021-05-15 NOTE — Progress Notes (Signed)
? ?Subjective/Chief Complaint: ?Having flatus and bms, xray better ? ? ?Objective: ?Vital signs in last 24 hours: ?Temp:  [98 ?F (36.7 ?C)-100.5 ?F (38.1 ?C)] 98.3 ?F (36.8 ?C) (03/04 0608) ?Pulse Rate:  [90-118] 91 (03/04 2993) ?Resp:  [17-18] 18 (03/04 7169) ?BP: (132-159)/(80-98) 151/98 (03/04 6789) ?SpO2:  [91 %-97 %] 94 % (03/04 0608) ?Last BM Date : 05/14/21 ? ?Intake/Output from previous day: ?03/03 0701 - 03/04 0700 ?In: 2754.5 [P.O.:300; I.V.:2304.5; IV Piggyback:150] ?Out: 4250 [Urine:1750; Emesis/NG output:2500] ?Intake/Output this shift: ?No intake/output data recorded. ? ?General nad ?Ab soft nontender nondistended umbilicus with scarring ? ?Lab Results:  ?Recent Labs  ?  05/14/21 ?0405 05/15/21 ?3810  ?WBC 15.3* 14.7*  ?HGB 18.4* 17.0  ?HCT 56.1* 51.6  ?PLT 328 300  ? ?BMET ?Recent Labs  ?  05/14/21 ?0405 05/15/21 ?1751  ?NA 134* 131*  ?K 4.2 3.8  ?CL 101 100  ?CO2 24 24  ?GLUCOSE 133* 119*  ?BUN 16 12  ?CREATININE 1.15 1.11  ?CALCIUM 8.2* 8.1*  ? ?PT/INR ?No results for input(s): LABPROT, INR in the last 72 hours. ?ABG ?No results for input(s): PHART, HCO3 in the last 72 hours. ? ?Invalid input(s): PCO2, PO2 ? ?Studies/Results: ?CT Abdomen Pelvis W Contrast ? ?Result Date: 05/13/2021 ?CLINICAL DATA:  Abdominal pain with nausea and vomiting since yesterday. EXAM: CT ABDOMEN AND PELVIS WITH CONTRAST TECHNIQUE: Multidetector CT imaging of the abdomen and pelvis was performed using the standard protocol following bolus administration of intravenous contrast. RADIATION DOSE REDUCTION: This exam was performed according to the departmental dose-optimization program which includes automated exposure control, adjustment of the mA and/or kV according to patient size and/or use of iterative reconstruction technique. CONTRAST:  OMNIPAQUE IOHEXOL 300 MG/ML  SOLN COMPARISON:  CT abdomen pelvis dated February 03, 2021. FINDINGS: Lower chest: No acute abnormality. Hepatobiliary: No focal liver abnormality is  seen. No gallstones, gallbladder wall thickening, or biliary dilatation. Pancreas: Unremarkable. No pancreatic ductal dilatation or surrounding inflammatory changes. Spleen: Normal in size without focal abnormality. Adrenals/Urinary Tract: Adrenal glands are unremarkable. Kidneys are normal, without renal calculi, focal lesion, or hydronephrosis. Bladder is unremarkable. Stomach/Bowel: Unchanged small hiatal hernia. The stomach is otherwise within normal limits. Multiple dilated loops of mid small bowel with transition point in the mid anterior abdomen near the umbilicus and abdominal wall diastasis, similar to prior study. No bowel wall thickening or surrounding inflammatory change. Proximal and distal small bowel are decompressed. Extensive left-sided colonic diverticulosis again noted. Normal appendix. Vascular/Lymphatic: No significant vascular findings are present. No enlarged abdominal or pelvic lymph nodes. Reproductive: Prostate is unremarkable. Other: Unchanged small bilateral fat containing inguinal hernias. The right testicle remains in the inguinal canal. No free fluid or pneumoperitoneum. Musculoskeletal: No acute or significant osseous findings. IMPRESSION: 1. Adhesion related high-grade small bowel obstruction with transition point in the mid anterior abdomen near the umbilicus and abdominal wall diastasis, similar to prior study. Electronically Signed   By: Obie Dredge M.D.   On: 05/13/2021 09:56  ? ?DG Abd Portable 1V ? ?Result Date: 05/15/2021 ?CLINICAL DATA:  Follow-up small bowel obstruction. EXAM: PORTABLE ABDOMEN - 1 VIEW COMPARISON:  05/14/2021 FINDINGS: The enteric tube tip and side port are below the level of the GE junction within the proximal stomach. Air-filled loops of large and small bowel are again noted. Compared with the previous exam there is been interval improvement in gaseous distension of the small bowel loops. IMPRESSION: Interval improvement in gaseous distension of small  bowel loops.  Electronically Signed   By: Signa Kell M.D.   On: 05/15/2021 08:38  ? ?DG Abd Portable 1V ? ?Result Date: 05/14/2021 ?CLINICAL DATA:  Small bowel obstruction. EXAM: PORTABLE ABDOMEN - 1 VIEW COMPARISON:  Abdominal x-ray from same day at 0022 hours. FINDINGS: Unchanged enteric tube in the stomach. Oral contrast is no longer visualized in the abdomen. Unchanged dilated small bowel in the central abdomen. IMPRESSION: 1. Oral contrast is no longer visualized. Unchanged small bowel obstruction. Electronically Signed   By: Obie Dredge M.D.   On: 05/14/2021 10:27  ? ?DG Abd Portable 1V-Small Bowel Obstruction Protocol-initial, 8 hr delay ? ?Result Date: 05/14/2021 ?CLINICAL DATA:  Small-bowel obstruction EXAM: PORTABLE ABDOMEN - 1 VIEW COMPARISON:  None. FINDINGS: Gas distended bowel throughout the visualized abdomen. There is contrast material within the gastric fundus. No contrast material visible outside of the stomach. IMPRESSION: 1. Gas distended bowel throughout the visualized abdomen. 2. Contrast material within the gastric fundus. Electronically Signed   By: Deatra Robinson M.D.   On: 05/14/2021 00:42  ? ?DG Abd Portable 1V-Small Bowel Protocol-Position Verification ? ?Result Date: 05/13/2021 ?CLINICAL DATA:  NG placement EXAM: PORTABLE ABDOMEN - 1 VIEW COMPARISON:  02/03/2021 FINDINGS: Esophagogastric tube is positioned with tip below the diaphragm, side port at the level of gastroesophageal junction. Gas and fluid distended loops bowel in the central upper abdomen measuring up to 5.0 cm in caliber. Scattered gas and stool present in the included colon. No obvious free air on single semi-erect radiograph. IMPRESSION: 1. Esophagogastric tube is positioned with tip below the diaphragm, side port at the level of gastroesophageal junction. Recommend slight advancement to ensure subdiaphragmatic positioning of both tip and side port. 2. Gas and fluid distended loops bowel in the central upper abdomen  measuring up to 5.0 cm in caliber, consistent with small bowel obstruction. Electronically Signed   By: Jearld Lesch M.D.   On: 05/13/2021 15:00   ? ?Anti-infectives: ?Anti-infectives (From admission, onward)  ? ? None  ? ?  ? ? ?Assessment/Plan: ?Small bowel obstruction ?-better today clniically and on xray, remove ng, clears to tstart ?-mobilize/pulm toilet ?-multiple times this has occurred.  Now that he is better will have him see Dr [name] as outpatient (not sure what was done at umbilicus but does appear to have mesh) as I think may need a dx lsc at some point soon  ?  ?FEN clears ?VTE -Lovenox ?ID -none needed ?Admit -inpatient ?  ?AKI - resolved ?Leukocytosis - better ?Seasonal allergies ?ADHD -resume home meds ? ?I reviewed last 24 h vitals and pain scores, last 48 h intake and output, last 24 h labs and trends, and last 24 h imaging results. ? ?This care required moderate level of medical decision making.  ? ? ?Jose Shaffer ?05/15/2021 ? ?

## 2021-05-16 MED ORDER — AMOXICILLIN-POT CLAVULANATE 875-125 MG PO TABS: 1.0000 | ORAL_TABLET | Freq: Two times a day (BID) | ORAL | 0 refills | Status: DC

## 2021-05-16 MED ORDER — AMOXICILLIN-POT CLAVULANATE 875-125 MG PO TABS
1.0000 | ORAL_TABLET | Freq: Two times a day (BID) | ORAL | Status: DC
  Administered 2021-05-16: 1 via ORAL
  Filled 2021-05-16: qty 1

## 2021-05-16 NOTE — Discharge Summary (Signed)
Physician Discharge Summary  ?Patient ID: ?Jose Shaffer ?MRN: [id number] ?DOB/AGE: 52/25/1972 52 y.o. ? ?Admit date: 05/13/2021 ?Discharge date: 05/16/2021 ? ?Admission Diagnoses: ?Sbo ? ?Discharge Diagnoses:  ?Principal Problem: ?  SBO (small bowel obstruction) (HCC) ? ? ?Discharged Condition: good ? ?Hospital Course: 87 yom with history of lap bih and umbo hernia repair with mesh. Has recurrent sbo. Admitted and this resolved conservatively. He does have left jaw pain and swelling c/w infection now.  Tol diet doing well.  Will be discharged on abx to see Dr [name] to consider outpatient laparoscopy for recurrent sbo.   ? ?Consults: None ? ?Significant Diagnostic Studies: ct scan ? ?Treatments: ng tube ? ?Discharge Exam: ?Blood pressure (!) 143/92, pulse 86, temperature 98.6 ?F (37 ?C), temperature source Oral, resp. rate 18, height 5\' 10"  (1.778 m), weight 111.1 kg, SpO2 97 %. ?General nad ?Left jaw and parotid with some swelling and tenderness ?Abd soft nontender ? ?Disposition: Discharge disposition: 01-Home or Self Care ? ? ? ? ? ? ? ?Allergies as of 05/16/2021   ? ?   Reactions  ? Ibuprofen Other (See Comments)  ? Per patient feels like KNOT in throat and itching with rash  ? Tramadol Anxiety  ? ?  ? ?  ?Medication List  ?  ? ?TAKE these medications   ? ?amoxicillin-clavulanate 875-125 MG tablet ?Commonly known as: AUGMENTIN ?Take 1 tablet by mouth every 12 (twelve) hours. ?  ?BENEFIBER PO ?Take 5 mLs by mouth 2 (two) times daily. Mix in liquid and drink ?  ?fluticasone 50 MCG/ACT nasal spray ?Commonly known as: FLONASE ?Place 1 spray into both nostrils every evening. ?What changed: when to take this ?  ?guanFACINE 2 MG Tb24 ER tablet ?Commonly known as: INTUNIV ?Take 2 mg by mouth See admin instructions. Take one tablet (2 mg) by mouth daily at 3pm ?  ?methylphenidate 50 MG CR capsule ?Commonly known as: METADATE CD ?Take 50 mg by mouth every morning. ?  ?montelukast 10 MG tablet ?Commonly known as:  SINGULAIR ?TAKE 1 TABLET(10 MG) BY MOUTH AT BEDTIME ?What changed:  ?how much to take ?how to take this ?when to take this ?additional instructions ?  ?naproxen sodium 220 MG tablet ?Commonly known as: ALEVE ?Take 440 mg by mouth 2 (two) times daily as needed (pain). ?  ?TESTOSTERONE CYPIONATE IM ?Inject into the muscle every Sunday. Pt obtains med without a rx from [facility] ( 200 mg Q Sunday) ?  ? ?  ? ? Follow-up Information   ? ? Friday, [facility] Follow up in 2 week(s).   ?Specialty: General Surgery ?Why: you will get call for appt ?Contact information: ?[address] ?[address] ?[place] ?[phone] ? ? ?  ?  ? ?  ?  ? ?  ? ? ?Signed: ?[phone] ?05/16/2021, 9:02 AM ? ? ?

## 2021-05-25 ENCOUNTER — Other Ambulatory Visit: Payer: Self-pay | Admitting: Surgery

## 2021-05-31 NOTE — Pre-Procedure Instructions (Addendum)
Surgical Instructions ? ? ? Your procedure is scheduled on Thursday, March 23. ? Report to [facility] Main Entrance "A" at 12:00 P.M., then check in with the Admitting office. ? Call this number if you have problems the morning of surgery: ? [phone] ? ? If you have any questions prior to your surgery date call [phone]: Open Monday-Friday 8am-4pm ? ? ? Remember: ? Do not eat after midnight the night before your surgery ? ?You may drink clear liquids until 11:00AM the morning of your surgery.   ?Clear liquids allowed are: Water, Non-Citrus Juices (without pulp), Carbonated Beverages, Clear Tea, Black Coffee ONLY (NO MILK, CREAM OR POWDERED CREAMER of any kind), and Gatorade ? ?Patient Instructions ? ?The night before surgery:  ?No food after midnight. ONLY clear liquids after midnight ? ?The day of surgery (if you do NOT have diabetes):  ?Drink ONE (1) Pre-Surgery Clear Ensure by 11:00AM the morning of surgery. Drink in one sitting. Do not sip.  ?This drink was given to you during your hospital  ?pre-op appointment visit. ? ?Nothing else to drink after completing the  ?Pre-Surgery Clear Ensure. ? ? ?       If you have questions, please contact your surgeon?s office. ? ?  ? Take these medicines the morning of surgery with A SIP OF WATER:  ? ?amoxicillin-clavulanate (AUGMENTIN)  ?fluticasone (FLONASE)  ? ? ?As of today, STOP taking any Aspirin (unless otherwise instructed by your surgeon) Aleve, Naproxen, Ibuprofen, Motrin, Advil, Goody's, BC's, all herbal medications, fish oil, and all vitamins. ? ?         ?Do not wear jewelry or makeup ?Do not wear lotions, powders, perfumes/colognes, or deodorant. ?Do not shave 48 hours prior to surgery.  Men may shave face and neck. ?Do not bring valuables to the hospital. ?Do not wear nail polish, gel polish, artificial nails, or any other type of covering on natural nails (fingers and toes) ?If you have artificial nails or gel coating that need to be removed by a nail  salon, please have this removed prior to surgery. Artificial nails or gel coating may interfere with anesthesia's ability to adequately monitor your vital signs. ? ?[facility] is not responsible for any belongings or valuables. .  ? ?Do NOT Smoke (Tobacco/Vaping)  24 hours prior to your procedure ? ?If you use a CPAP at night, you may bring your mask for your overnight stay. ?  ?Contacts, glasses, hearing aids, dentures or partials may not be worn into surgery, please bring cases for these belongings ?  ?For patients admitted to the hospital, discharge time will be determined by your treatment team. ?  ?Patients discharged the day of surgery will not be allowed to drive home, and someone needs to stay with them for 24 hours. ? ?NO VISITORS WILL BE ALLOWED IN PRE-OP WHERE PATIENTS ARE PREPPED FOR SURGERY.  ONLY 1 SUPPORT PERSON MAY BE PRESENT IN THE WAITING ROOM WHILE YOU ARE IN SURGERY.  IF YOU ARE TO BE ADMITTED, ONCE YOU ARE IN YOUR ROOM YOU WILL BE ALLOWED TWO (2) VISITORS. 1 (ONE) VISITOR MAY STAY OVERNIGHT BUT MUST ARRIVE TO THE ROOM BY 8pm.  Minor children may have two parents present. Special consideration for safety and communication needs will be reviewed on a case by case basis. ? ?Special instructions:   ? ?Oral Hygiene is also important to reduce your risk of infection.  Remember - BRUSH YOUR TEETH THE MORNING OF SURGERY WITH YOUR REGULAR TOOTHPASTE ? ? ?  [facility]- Preparing For Surgery ? ?Before surgery, you can play an important role. Because skin is not sterile, your skin needs to be as free of germs as possible. You can reduce the number of germs on your skin by washing with CHG (chlorahexidine gluconate) Soap before surgery.  CHG is an antiseptic cleaner which kills germs and bonds with the skin to continue killing germs even after washing.   ? ? ?Please do not use if you have an allergy to CHG or antibacterial soaps. If your skin becomes reddened/irritated stop using the CHG.  ?Do not shave  (including legs and underarms) for at least 48 hours prior to first CHG shower. It is OK to shave your face. ? ?Please follow these instructions carefully. ?  ? ? Shower the NIGHT BEFORE SURGERY and the MORNING OF SURGERY with CHG Soap.  ? If you chose to wash your hair, wash your hair first as usual with your normal shampoo. After you shampoo, rinse your hair and body thoroughly to remove the shampoo.  Then [employer] and genitals (private parts) with your normal soap and rinse thoroughly to remove soap. ? ?After that Use CHG Soap as you would any other liquid soap. You can apply CHG directly to the skin and wash gently with a scrungie or a clean washcloth.  ? ?Apply the CHG Soap to your body ONLY FROM THE NECK DOWN.  Do not use on open wounds or open sores. Avoid contact with your eyes, ears, mouth and genitals (private parts). Wash Face and genitals (private parts)  with your normal soap.  ? ?Wash thoroughly, paying special attention to the area where your surgery will be performed. ? ?Thoroughly rinse your body with warm water from the neck down. ? ?DO NOT shower/wash with your normal soap after using and rinsing off the CHG Soap. ? ?Pat yourself dry with a CLEAN TOWEL. ? ?Wear CLEAN PAJAMAS to bed the night before surgery ? ?Place CLEAN SHEETS on your bed the night before your surgery ? ?DO NOT SLEEP WITH PETS. ? ? ?Day of Surgery: ? ?Take a shower with CHG soap. ?Wear Clean/Comfortable clothing the morning of surgery ?Do not apply any deodorants/lotions.   ?Remember to brush your teeth WITH YOUR REGULAR TOOTHPASTE. ? ? ? ?COVID testing ? ?If you are going to stay overnight or be admitted after your procedure/surgery and require a pre-op COVID test, please follow these instructions after your COVID test  ? ?You are not required to quarantine however you are required to wear a well-fitting mask when you are out and around people not in your household.  If your mask becomes wet or soiled, replace with a new  one. ? ?Wash your hands often with soap and water for 20 seconds or clean your hands with an alcohol-based hand sanitizer that contains at least 60% alcohol. ? ?Do not share personal items. ? ?Notify your provider: ?if you are in close contact with someone who has COVID  ?or if you develop a fever of 100.4 or greater, sneezing, cough, sore throat, shortness of breath or body aches. ? ?  ?Please read over the following fact sheets that you were given.  ? ?

## 2021-06-01 ENCOUNTER — Encounter (HOSPITAL_COMMUNITY): Payer: Self-pay

## 2021-06-01 ENCOUNTER — Encounter (HOSPITAL_COMMUNITY)
Admission: RE | Admit: 2021-06-01 | Discharge: 2021-06-01 | Disposition: A | Discharge status: Home / Self Care | Payer: BC Managed Care – PPO | Source: Ambulatory Visit | Attending: Orthopaedic Surgery | Admitting: Orthopaedic Surgery

## 2021-06-01 ENCOUNTER — Other Ambulatory Visit: Payer: Self-pay

## 2021-06-01 VITALS — BP 137/89 | HR 89 | Temp 98.3°F | Resp 17 | Ht 71.0 in | Wt 238.5 lb

## 2021-06-01 DIAGNOSIS — Z01812 Encounter for preprocedural laboratory examination: Secondary | ICD-10-CM | POA: Insufficient documentation

## 2021-06-01 DIAGNOSIS — K56609 Unspecified intestinal obstruction, unspecified as to partial versus complete obstruction: Secondary | ICD-10-CM | POA: Insufficient documentation

## 2021-06-01 DIAGNOSIS — G4733 Obstructive sleep apnea (adult) (pediatric): Secondary | ICD-10-CM | POA: Insufficient documentation

## 2021-06-01 DIAGNOSIS — Z01818 Encounter for other preprocedural examination: Secondary | ICD-10-CM

## 2021-06-01 DIAGNOSIS — K429 Umbilical hernia without obstruction or gangrene: Secondary | ICD-10-CM | POA: Insufficient documentation

## 2021-06-01 HISTORY — DX: Unspecified osteoarthritis, unspecified site: M19.90

## 2021-06-01 LAB — CBC
HCT: 54.9 % — ABNORMAL HIGH (ref 39.0–52.0)
Hemoglobin: 17.5 g/dL — ABNORMAL HIGH (ref 13.0–17.0)
MCH: 28.8 pg (ref 26.0–34.0)
MCHC: 31.9 g/dL (ref 30.0–36.0)
MCV: 90.3 fL (ref 80.0–100.0)
Platelets: 339 10*3/uL (ref 150–400)
RBC: 6.08 MIL/uL — ABNORMAL HIGH (ref 4.22–5.81)
RDW: 14.1 % (ref 11.5–15.5)
WBC: 10 10*3/uL (ref 4.0–10.5)
nRBC: 0 % (ref 0.0–0.2)

## 2021-06-01 LAB — COMPREHENSIVE METABOLIC PANEL
ALT: 33 U/L (ref 0–44)
AST: 27 U/L (ref 15–41)
Albumin: 3.4 g/dL — ABNORMAL LOW (ref 3.5–5.0)
Alkaline Phosphatase: 44 U/L (ref 38–126)
Anion gap: 8 (ref 5–15)
BUN: 12 mg/dL (ref 6–20)
CO2: 27 mmol/L (ref 22–32)
Calcium: 9.1 mg/dL (ref 8.9–10.3)
Chloride: 103 mmol/L (ref 98–111)
Creatinine, Ser: 1.17 mg/dL (ref 0.61–1.24)
GFR, Estimated: >60 mL/min (ref >60)
Glucose, Bld: 112 mg/dL — ABNORMAL HIGH (ref 70–99)
Potassium: 4.2 mmol/L (ref 3.5–5.1)
Sodium: 138 mmol/L (ref 135–145)
Total Bilirubin: 0.8 mg/dL (ref 0.3–1.2)
Total Protein: 6.6 g/dL (ref 6.5–8.1)

## 2021-06-01 NOTE — Progress Notes (Signed)
Abnormal labs in PAT: Hgb 17.5; HCT 64.9. Dr. [name] office was called and notified Joni Reining, RN). ?

## 2021-06-01 NOTE — Pre-Procedure Instructions (Signed)
Surgical Instructions ? ? ? Your procedure is scheduled on Thursday, March 23. ? Report to [facility] Main Entrance "A" at 10:00 P.M., then check in with the Admitting office. ? Call this number if you have problems the morning of surgery: ? [phone] ? ? If you have any questions prior to your surgery date call [phone]: Open Monday-Friday 8am-4pm ? ? ? Remember: ? Do not eat after midnight the night before your surgery ? ?You may drink clear liquids until 11:00AM the morning of your surgery.   ?Clear liquids allowed are: Water, Non-Citrus Juices (without pulp), Carbonated Beverages, Clear Tea, Black Coffee ONLY (NO MILK, CREAM OR POWDERED CREAMER of any kind), and Gatorade ? ?Patient Instructions ? ?The night before surgery:  ?No food after midnight. ONLY clear liquids after midnight ? ?The day of surgery (if you do NOT have diabetes):  ?Drink ONE (1) Pre-Surgery Clear Ensure by 09:00AM the morning of surgery. Drink in one sitting. Do not sip.  ?This drink was given to you during your hospital  ?pre-op appointment visit. ? ?Nothing else to drink after completing the  ?Pre-Surgery Clear Ensure. ? ? ?       If you have questions, please contact your surgeon?s office. ? ?  ? Take these medicines the morning of surgery with A SIP OF WATER:  ? ?amoxicillin-clavulanate (AUGMENTIN)  ?fluticasone (FLONASE)  ? ? ?As of today, STOP taking any Aspirin (unless otherwise instructed by your surgeon) Aleve, Naproxen, Ibuprofen, Motrin, Advil, Goody's, BC's, all herbal medications, fish oil, and all vitamins. ? ? ? The day of surgery: ?         ?Do not wear jewelry  ?Do not wear lotions, powders, colognes, or deodorant. ?Men may shave face and neck. ?Do not bring valuables to the hospital. ? ? ?[facility] is not responsible for any belongings or valuables. .  ? ?Do NOT Smoke (Tobacco/Vaping)  24 hours prior to your procedure ? ?If you use a CPAP at night, you may bring your mask for your overnight stay. ?  ?Contacts,  glasses, hearing aids, dentures or partials may not be worn into surgery, please bring cases for these belongings ?  ?For patients admitted to the hospital, discharge time will be determined by your treatment team. ?  ?Patients discharged the day of surgery will not be allowed to drive home, and someone needs to stay with them for 24 hours. ? ?NO VISITORS WILL BE ALLOWED IN PRE-OP WHERE PATIENTS ARE PREPPED FOR SURGERY.  ONLY 1 SUPPORT PERSON MAY BE PRESENT IN THE WAITING ROOM WHILE YOU ARE IN SURGERY.  IF YOU ARE TO BE ADMITTED, ONCE YOU ARE IN YOUR ROOM YOU WILL BE ALLOWED TWO (2) VISITORS. 1 (ONE) VISITOR MAY STAY OVERNIGHT BUT MUST ARRIVE TO THE ROOM BY 8pm.  Minor children may have two parents present. Special consideration for safety and communication needs will be reviewed on a case by case basis. ? ?Special instructions:   ? ?Oral Hygiene is also important to reduce your risk of infection.  Remember - BRUSH YOUR TEETH THE MORNING OF SURGERY WITH YOUR REGULAR TOOTHPASTE ? ? ?[facility]- Preparing For Surgery ? ?Before surgery, you can play an important role. Because skin is not sterile, your skin needs to be as free of germs as possible. You can reduce the number of germs on your skin by washing with CHG (chlorahexidine gluconate) Soap before surgery.  CHG is an antiseptic cleaner which kills germs and bonds with the skin  to continue killing germs even after washing.   ? ? ?Please do not use if you have an allergy to CHG or antibacterial soaps. If your skin becomes reddened/irritated stop using the CHG.  ?Do not shave (including legs and underarms) for at least 48 hours prior to first CHG shower. It is OK to shave your face. ? ?Please follow these instructions carefully. ?  ? ? Shower the NIGHT BEFORE SURGERY and the MORNING OF SURGERY with CHG Soap.  ? If you chose to wash your hair, wash your hair first as usual with your normal shampoo. After you shampoo, rinse your hair and body thoroughly to remove the  shampoo.  Then [employer] and genitals (private parts) with your normal soap and rinse thoroughly to remove soap. ? ?After that Use CHG Soap as you would any other liquid soap. You can apply CHG directly to the skin and wash gently with a scrungie or a clean washcloth.  ? ?Apply the CHG Soap to your body ONLY FROM THE NECK DOWN.  Do not use on open wounds or open sores. Avoid contact with your eyes, ears, mouth and genitals (private parts). Wash Face and genitals (private parts)  with your normal soap.  ? ?Wash thoroughly, paying special attention to the area where your surgery will be performed. ? ?Thoroughly rinse your body with warm water from the neck down. ? ?DO NOT shower/wash with your normal soap after using and rinsing off the CHG Soap. ? ?Pat yourself dry with a CLEAN TOWEL. ? ?Wear CLEAN PAJAMAS to bed the night before surgery ? ?Place CLEAN SHEETS on your bed the night before your surgery ? ?DO NOT SLEEP WITH PETS. ? ? ?Day of Surgery: ? ?Take a shower with CHG soap. ?Wear Clean/Comfortable clothing the morning of surgery ?Do not apply any deodorants/lotions.   ?Remember to brush your teeth WITH YOUR REGULAR TOOTHPASTE. ? ? ? ?COVID testing OR BEFORE SURGERY ? ?If you are going to stay overnight or be admitted after your procedure/surgery and require a pre-op COVID test, please follow these instructions after your COVID test  ? ?You are not required to quarantine however you are required to wear a well-fitting mask when you are out and around people not in your household.  If your mask becomes wet or soiled, replace with a new one. ? ?Wash your hands often with soap and water for 20 seconds or clean your hands with an alcohol-based hand sanitizer that contains at least 60% alcohol. ? ?Do not share personal items. ? ?Notify your provider: ?if you are in close contact with someone who has COVID  ?or if you develop a fever of 100.4 or greater, sneezing, cough, sore throat, shortness of breath or body  aches. ? ?  ?Please read over the following fact sheets that you were given.  ? ?

## 2021-06-01 NOTE — Progress Notes (Addendum)
PCP - Aleatha Borer, NP ?Cardiologist - denies ? ?PPM/ICD - denies ?Device Orders - n/a ?Rep Notified - n/a ? ?Chest x-ray - n/a ?EKG - n/a ?Stress Test - denies ?ECHO - denies ?Cardiac Cath - denies ? ?Sleep Study - yes, positive for OSA ?CPAP - yes - 6 ? ?Fasting Blood Sugar - n/a ? ?Blood Thinner Instructions: n/a ? ?Aspirin Instructions: Patient was instructed: As of today, STOP taking any Aspirin (unless otherwise instructed by your surgeon) Aleve, Naproxen, Ibuprofen, Motrin, Advil, Goody's, BC's, all herbal medications, fish oil, and all vitamins. ? ?ERAS Protcol - yes ?PRE-SURGERY Ensure - yes, until 09:00 o'clock ? ?COVID TEST- no, per new policy ? ? ?Anesthesia review: yes, patient was recent hospitalized for SBP; abnormal labs in PAT Hgb 17.5; HCT 54.9. Dr. [name] was notified. ? ?Patient denies shortness of breath, fever, cough and chest pain at PAT appointment ? ? ?All instructions explained to the patient, with a verbal understanding of the material. Patient agrees to go over the instructions while at home for a better understanding. Patient also instructed to self quarantine after being tested for COVID-19. The opportunity to ask questions was provided. ?  ?

## 2021-06-02 NOTE — Progress Notes (Signed)
Anesthesia Chart Review: ? Case: [id number] Date/Time: 06/03/21 1145  ? Procedures:  ?    LAPAROSCOPIC UMBILICAL HERNIA REPAIR WITH MESH POSSIBLE OPEN WITH LYSIS OF ADHESION ?    POSSIBLE SMALL BOWEL RESECTION  ? Anesthesia type: General  ? Pre-op diagnosis: SMALL BOWEL OBSTRUCTION, RECURRENT UMBILICAL HERNIA  ? Location: [facility] ROOM 09 / [facility]  ? Surgeons: Abigail Miyamoto, MD  ? ?  ? ? ?DISCUSSION: Patient is a 51 year old male scheduled for the above procedure.  Above procedure recommended given recurrent hospitalizations for small bowel obstruction. ? ?History includes never smoker, OSA (uses CPAP @ 6), diverticulitis, ADD, anxiety, hernia repair (bilateral IHR and umbilical hernia repair 01/22/15), SBO (08/2018, 08/2020, 01/2021, 05/2021).  BMI is consistent with obesity. ? ?Anesthesia team to evaluate on the day of surgery. ? ?VS: BP 137/89   Pulse 89   Temp 36.8 ?C (Oral)   Resp 17   Ht 5\' 11"  (1.803 m)   Wt 108.2 kg   SpO2 99%   BMI 33.26 kg/m?  ? ?PROVIDERS: ?PCP - , NP ? ? ?LABS: Labs reviewed: Acceptable for surgery. ?(all labs ordered are listed, but only abnormal results are displayed) ? ?Labs Reviewed  ?COMPREHENSIVE METABOLIC PANEL - Abnormal; Notable for the following components:  ?    Result Value  ? Glucose, Bld 112 (*)   ? Albumin 3.4 (*)   ? All other components within normal limits  ?CBC - Abnormal; Notable for the following components:  ? RBC 6.08 (*)   ? Hemoglobin 17.5 (*)   ? HCT 54.9 (*)   ? All other components within normal limits  ? ? ? ?IMAGES: ?Xray 1V ABD 05/15/21: ?IMPRESSION: ?Interval improvement in gaseous distension of small bowel loops. ? ?CT Abd/pelvis 05/13/21: ?IMPRESSION: ?1. Adhesion related high-grade small bowel obstruction with ?transition point in the mid anterior abdomen near the umbilicus and ?abdominal wall diastasis, similar to prior study. ? ? ?EKG: N/A ? ? ?CV: N/A ? ?Past Medical History:  ?Diagnosis Date  ? ADD (attention deficit disorder)   ?  Allergy   ? Anxiety state, unspecified 03/10/2013  ? Arthritis   ? right ankle  ? Diverticulitis   ? Insomnia   ? Long-term current use of testosterone replacement therapy   ? Sleep apnea   ? ? ?Past Surgical History:  ?Procedure Laterality Date  ? HERNIA REPAIR N/A   ? [employer] 05/24/2020  ? INGUINAL HERNIA REPAIR Bilateral 01/22/2015  ? Procedure: LAPAROSCOPIC BILATERAL INGUINAL HERNIA REPAIR WITH MESH AND UMBILICAL HERNIA REPAIR;  Surgeon: 13/12/2014, MD;  Location: [facility];  Service: General;  Laterality: Bilateral;  ? QUADRICEPS TENDON REPAIR  08/2008  ? UMBILICAL HERNIA REPAIR N/A 01/22/2015  ? Procedure: UMBILICAL HERNIA REPAIR;  Surgeon: 13/12/2014, MD;  Location: [facility];  Service: General;  Laterality: N/A;  ? ? ?MEDICATIONS: ? amoxicillin-clavulanate (AUGMENTIN) 875-125 MG tablet  ? fluticasone (FLONASE) 50 MCG/ACT nasal spray  ? guanFACINE (INTUNIV) 2 MG TB24 ER tablet  ? methylphenidate (METADATE CD) 50 MG CR capsule  ? montelukast (SINGULAIR) 10 MG tablet  ? naproxen sodium (ALEVE) 220 MG tablet  ? TESTOSTERONE CYPIONATE IM  ? Wheat Dextrin (BENEFIBER PO)  ? ?No current facility-administered medications for this encounter.  ? ? ?Abigail Miyamoto, PA-C ?Surgical Short Stay/Anesthesiology ?[facility] Phone [phone] ?[facility] Phone [phone] ?06/02/2021 11:16 AM ? ? ? ? ? ? ?

## 2021-06-02 NOTE — H&P (Signed)
?PROVIDER: Wayne Both, MD ? ?MRN: [id number] ?DOB: 14-Nov-1970 ?DATE OF ENCOUNTER: 05/25/2021 ?Subjective  ? ?Chief Complaint: No chief complaint on file. ? ? ?History of Present Illness: ?Jose Shaffer is a 51 y.o. male who is seen  after a recent hospital follow-up where he was admitted with a small bowel obstruction. I did an open umbilical hernia repair with mesh approximately 6 years ago on him. He had done well until about a year ago when he started having a stitch abscess. In November of last year he presented with a small bowel obstruction which looks like on CT scan that a loop of bowel is adherent to the mesh. He improved with conservative management but returned last month with another bowel obstruction. The CT scan suggested a recurrent hernia as well with bowel stuck in the same location. He again improved without the need for surgery but is still having occasional abdominal discomfort.. ? ? ? ?Review of Systems: ?A complete review of systems was obtained from the patient. I have reviewed this information and discussed as appropriate with the patient. See HPI as well for other ROS. ? ?ROS  ? ?Medical History: ?Past Medical History:  ?Diagnosis Date  ? Anxiety  ? Sleep apnea  ? ?Patient Active Problem List  ?Diagnosis  ? Abdominal pain, left lower quadrant  ? Acquired varus deformity of right ankle  ? ADD (attention deficit disorder)  ? Anxiety state  ? BPH (benign prostatic hyperplasia)  ? Class 2 obesity due to excess calories with body mass index (BMI) of 35.0 to 35.9 in adult  ? Insomnia  ? Osteoarthritis of right subtalar joint  ? Post-traumatic arthritis of ankle, right  ? Right ankle instability  ? Sleep apnea  ? Snoring  ? Intestinal adhesions with obstruction, unspecified whether partial or complete (CMS-HCC)  ? Unspecified intestinal obstruction, unspecified as to partial versus complete obstruction (CMS-HCC)  ? OSA on CPAP  ? ?Past Surgical History:  ?Procedure Laterality Date   ? HERNIA REPAIR 2015  ? ? ?Allergies  ?Allergen Reactions  ? Other Hives, Rash and Swelling  ? Ibuprofen Other (See Comments)  ?Per patient feels like KNOT in throat and itching with rash ?Per patient feels like KNOT in throat and itching with rash ? ? Tramadol Anxiety  ? ?Current Outpatient Medications on File Prior to Visit  ?Medication Sig Dispense Refill  ? ADDERALL XR 10 mg XR capsule Take 10 mg by mouth once daily  ? ADDERALL XR 20 mg XR capsule Take 20 mg by mouth once daily  ? doxazosin (CARDURA) 4 MG tablet Take 4 mg by mouth once daily  ? fluticasone propionate (FLONASE) 50 mcg/actuation nasal spray 1 spray by Nasal route once daily  ? guanFACINE (INTUNIV) 1 mg ER tablet Take by mouth  ? methylphenidate HCl (JORNAY PM) 60 mg CDES Take by mouth  ? montelukast (SINGULAIR) 10 mg tablet Take 10 mg by mouth once daily  ? naproxen sodium (ALEVE) 220 MG tablet Take 220 mg by mouth as needed  ? ?No current facility-administered medications on file prior to visit.  ? ?History reviewed. No pertinent family history.  ? ?Social History  ? ?Tobacco Use  ?Smoking Status Never  ?Smokeless Tobacco Never  ? ? ?Social History  ? ?Socioeconomic History  ? Marital status: Married  ?Tobacco Use  ? Smoking status: Never  ? Smokeless tobacco: Never  ?Vaping Use  ? Vaping Use: Never used  ?Substance and Sexual Activity  ? Alcohol use: Not  Currently  ? Drug use: Never  ? ?Objective:  ? ?There were no vitals filed for this visit.  ?There is no height or weight on file to calculate BMI. ? ?Physical Exam  ? ?He currently appears comfortable ? ?Abdomen is soft. It does feel like there is a recurrent hernia with a small 1 cm defect at the umbilicus. He still has a small scab from the previous stitch abscess. ? ?Labs, Imaging and Diagnostic Testing: ? ?Reviewed his most recent CT scans. ? ?Assessment and Plan:  ? ?Diagnoses and all orders for this visit: ? ?History of small bowel obstruction ? ?Recurrent umbilical hernia ? ? ? ?Now  that he has had 2 small bowel obstructions and now has recurrent hernia, I recommend urgent surgery to repair the issue. This would be with a laparoscopic, possible open lysis of adhesions and umbilical hernia repair with mesh. We discussed the risks which includes but is not limited to injury to the bowel with need for bowel resection, use of mesh, hernia recurrence, cardiopulmonary issues, postoperative recovery, etc. Given his symptoms and frequent obstructions, urgent surgery is recommended and will be scheduled. He agrees with the plans  ?

## 2021-06-02 NOTE — Anesthesia Preprocedure Evaluation (Addendum)
Anesthesia Evaluation  ?Patient identified by MRN, date of birth, ID band ?Patient awake ? ? ? ?Reviewed: ?Allergy & Precautions, NPO status , Patient's Chart, lab work & pertinent test results ? ?Airway ?Mallampati: II ? ?TM Distance: >3 FB ?Neck ROM: Full ? ? ? Dental ? ?(+) Dental Advisory Given ?  ?Pulmonary ?sleep apnea ,  ?  ?Pulmonary exam normal ?breath sounds clear to auscultation ? ? ? ? ? ? Cardiovascular ?negative cardio ROS ?Normal cardiovascular exam ?Rhythm:Regular Rate:Normal ? ? ?  ?Neuro/Psych ?negative neurological ROS ?   ? GI/Hepatic ?negative GI ROS, Neg liver ROS,   ?Endo/Other  ?negative endocrine ROS ? Renal/GU ?negative Renal ROS  ? ?  ?Musculoskeletal ? ?(+) Arthritis ,  ? Abdominal ?(+) + obese,   ?Peds ? Hematology ?negative hematology ROS ?(+)   ?Anesthesia Other Findings ? ? Reproductive/Obstetrics ? ?  ? ? ? ? ? ? ? ? ? ? ? ? ? ?  ?  ? ? ? ? ? ?                                  Anesthesia Evaluation  ?Patient identified by MRN, date of birth, ID band ?Patient awake ? ? ? ?Reviewed: ?Allergy & Precautions, NPO status , Patient's Chart, lab work & pertinent test results ? ?Airway ?Mallampati: I ? ?TM Distance: >3 FB ?Neck ROM: Full ? ? ? Dental ? ?(+) Teeth Intact, Dental Advisory Given ?  ?Pulmonary ?sleep apnea and Continuous Positive Airway Pressure Ventilation ,  ?  ?breath sounds clear to auscultation ? ? ? ? ? ? Cardiovascular ? ?Rhythm:Regular Rate:Normal ? ? ?  ?Neuro/Psych ?  ? GI/Hepatic ?  ?Endo/Other  ? ? Renal/GU ?  ? ?  ?Musculoskeletal ? ? Abdominal ?  ?Peds ? Hematology ?  ?Anesthesia Other Findings ? ? Reproductive/Obstetrics ? ?  ? ? ? ? ? ? ? ? ? ? ? ? ? ?  ?  ? ? ? ? ? ? ? ? ?Anesthesia Physical ?Anesthesia Plan ? ?ASA: I ? ?Anesthesia Plan: General  ? ?Post-op Pain Management:   ? ?Induction: Intravenous ? ?Airway Management Planned: Oral ETT ? ?Additional Equipment:  ? ?Intra-op Plan:  ? ?Post-operative Plan: Extubation in  OR ? ?Informed Consent: I have reviewed the patients History and Physical, chart, labs and discussed the procedure including the risks, benefits and alternatives for the proposed anesthesia with the patient or authorized representative who has indicated his/her understanding and acceptance.  ? ?Dental advisory given ? ?Plan Discussed with: CRNA, Anesthesiologist and Surgeon ? ?Anesthesia Plan Comments:   ? ? ? ? ? ? ?Anesthesia Quick Evaluation ? ?Anesthesia Physical ?Anesthesia Plan ? ?ASA: 2 ? ?Anesthesia Plan: General  ? ?Post-op Pain Management: Tylenol PO (pre-op)* and Celebrex PO (pre-op)*  ? ?Induction: Intravenous ? ?PONV Risk Score and Plan: 3 and Ondansetron, Dexamethasone, Treatment may vary due to age or medical condition and Midazolam ? ?Airway Management Planned: Oral ETT ? ?Additional Equipment:  ? ?Intra-op Plan:  ? ?Post-operative Plan: Extubation in OR ? ?Informed Consent: I have reviewed the patients History and Physical, chart, labs and discussed the procedure including the risks, benefits and alternatives for the proposed anesthesia with the patient or authorized representative who has indicated his/her understanding and acceptance.  ? ? ? ?Dental advisory given ? ?Plan Discussed with: CRNA ? ?Anesthesia Plan Comments: (PAT note written 06/02/2021 by Shonna Chock,  PA-C. ?)  ? ? ? ? ?Anesthesia Quick Evaluation ? ?

## 2021-06-03 ENCOUNTER — Inpatient Hospital Stay (HOSPITAL_COMMUNITY)
Admission: RE | Admit: 2021-06-03 | Discharge: 2021-06-05 | DRG: 674 | Disposition: A | Discharge status: Home / Self Care | Payer: BC Managed Care – PPO | Attending: Surgery | Admitting: Surgery

## 2021-06-03 ENCOUNTER — Other Ambulatory Visit: Payer: Self-pay

## 2021-06-03 ENCOUNTER — Ambulatory Visit (HOSPITAL_COMMUNITY): Payer: BC Managed Care – PPO | Admitting: Vascular Surgery

## 2021-06-03 ENCOUNTER — Encounter (HOSPITAL_COMMUNITY): Payer: Self-pay | Admitting: Surgery

## 2021-06-03 ENCOUNTER — Ambulatory Visit (HOSPITAL_COMMUNITY): Payer: BC Managed Care – PPO | Admitting: Anesthesiology

## 2021-06-03 ENCOUNTER — Encounter (HOSPITAL_COMMUNITY)
Admission: RE | Disposition: A | Discharge status: Home / Self Care | Payer: Self-pay | Source: Ambulatory Visit | Attending: Surgery

## 2021-06-03 DIAGNOSIS — F988 Other specified behavioral and emotional disorders with onset usually occurring in childhood and adolescence: Secondary | ICD-10-CM | POA: Diagnosis present

## 2021-06-03 DIAGNOSIS — E6609 Other obesity due to excess calories: Secondary | ICD-10-CM | POA: Diagnosis present

## 2021-06-03 DIAGNOSIS — T83728A Exposure of other implanted mesh and other prosthetic materials to surrounding organ or tissue, initial encounter: Principal | ICD-10-CM | POA: Diagnosis present

## 2021-06-03 DIAGNOSIS — F411 Generalized anxiety disorder: Secondary | ICD-10-CM | POA: Diagnosis present

## 2021-06-03 DIAGNOSIS — Z886 Allergy status to analgesic agent status: Secondary | ICD-10-CM | POA: Diagnosis not present

## 2021-06-03 DIAGNOSIS — K565 Intestinal adhesions [bands], unspecified as to partial versus complete obstruction: Secondary | ICD-10-CM | POA: Diagnosis present

## 2021-06-03 DIAGNOSIS — G4733 Obstructive sleep apnea (adult) (pediatric): Secondary | ICD-10-CM | POA: Diagnosis present

## 2021-06-03 DIAGNOSIS — Z5331 Laparoscopic surgical procedure converted to open procedure: Secondary | ICD-10-CM | POA: Diagnosis not present

## 2021-06-03 DIAGNOSIS — Z6833 Body mass index (BMI) 33.0-33.9, adult: Secondary | ICD-10-CM | POA: Diagnosis not present

## 2021-06-03 DIAGNOSIS — Z888 Allergy status to other drugs, medicaments and biological substances status: Secondary | ICD-10-CM | POA: Diagnosis not present

## 2021-06-03 DIAGNOSIS — Y828 Other medical devices associated with adverse incidents: Secondary | ICD-10-CM | POA: Diagnosis present

## 2021-06-03 DIAGNOSIS — K56609 Unspecified intestinal obstruction, unspecified as to partial versus complete obstruction: Principal | ICD-10-CM | POA: Diagnosis present

## 2021-06-03 DIAGNOSIS — K420 Umbilical hernia with obstruction, without gangrene: Secondary | ICD-10-CM | POA: Diagnosis present

## 2021-06-03 HISTORY — PX: BOWEL RESECTION: SHX1257

## 2021-06-03 HISTORY — PX: LAPAROTOMY: SHX154

## 2021-06-03 HISTORY — PX: LAPAROSCOPY: SHX197

## 2021-06-03 SURGERY — LAPAROSCOPY, DIAGNOSTIC
Anesthesia: General | Site: Abdomen

## 2021-06-03 MED ORDER — PROPOFOL 1000 MG/100ML IV EMUL
INTRAVENOUS | Status: AC
  Filled 2021-06-03: qty 100

## 2021-06-03 MED ORDER — HYDROMORPHONE HCL 1 MG/ML IJ SOLN: 0.2500 mg | INTRAMUSCULAR | Status: DC | PRN

## 2021-06-03 MED ORDER — PROPOFOL 10 MG/ML IV BOLUS
INTRAVENOUS | Status: DC | PRN
  Administered 2021-06-03: 200 mg via INTRAVENOUS

## 2021-06-03 MED ORDER — DEXAMETHASONE SODIUM PHOSPHATE 10 MG/ML IJ SOLN
INTRAMUSCULAR | Status: AC
  Filled 2021-06-03: qty 1

## 2021-06-03 MED ORDER — BUPIVACAINE HCL 0.25 % IJ SOLN
INTRAMUSCULAR | Status: DC | PRN
  Administered 2021-06-03: 20 mL

## 2021-06-03 MED ORDER — OXYCODONE HCL 5 MG PO TABS
5.0000 mg | ORAL_TABLET | ORAL | Status: DC | PRN
  Administered 2021-06-03 – 2021-06-05 (×4): 10 mg via ORAL
  Filled 2021-06-03 (×4): qty 2

## 2021-06-03 MED ORDER — ONDANSETRON 4 MG PO TBDP
4.0000 mg | ORAL_TABLET | Freq: Four times a day (QID) | ORAL | Status: DC | PRN
Start: 2021-06-03 — End: 2021-06-05

## 2021-06-03 MED ORDER — CEFAZOLIN SODIUM-DEXTROSE 2-4 GM/100ML-% IV SOLN
2.0000 g | INTRAVENOUS | Status: AC
  Administered 2021-06-03: 2 g via INTRAVENOUS

## 2021-06-03 MED ORDER — POTASSIUM CHLORIDE IN NACL 20-0.9 MEQ/L-% IV SOLN
INTRAVENOUS | Status: DC
  Filled 2021-06-03 (×4): qty 1000

## 2021-06-03 MED ORDER — ACETAMINOPHEN 500 MG PO TABS
ORAL_TABLET | ORAL | Status: AC
  Filled 2021-06-03: qty 2

## 2021-06-03 MED ORDER — ENSURE PRE-SURGERY PO LIQD
296.0000 mL | Freq: Once | ORAL | Status: DC
Start: 2021-06-03 — End: 2021-06-03

## 2021-06-03 MED ORDER — OXYCODONE HCL 5 MG/5ML PO SOLN: 5.0000 mg | Freq: Once | ORAL | Status: DC | PRN

## 2021-06-03 MED ORDER — LIDOCAINE 2% (20 MG/ML) 5 ML SYRINGE
INTRAMUSCULAR | Status: DC | PRN
Start: 2021-06-03 — End: 2021-06-03
  Administered 2021-06-03: 100 mg via INTRAVENOUS

## 2021-06-03 MED ORDER — HYDROMORPHONE HCL 1 MG/ML IJ SOLN
0.2500 mg | INTRAMUSCULAR | Status: DC | PRN
  Administered 2021-06-03 (×4): 0.5 mg via INTRAVENOUS

## 2021-06-03 MED ORDER — CHLORHEXIDINE GLUCONATE CLOTH 2 % EX PADS: 6.0000 | MEDICATED_PAD | Freq: Once | CUTANEOUS | Status: DC

## 2021-06-03 MED ORDER — ROCURONIUM BROMIDE 10 MG/ML (PF) SYRINGE
PREFILLED_SYRINGE | INTRAVENOUS | Status: DC | PRN
  Administered 2021-06-03: 10 mg via INTRAVENOUS
  Administered 2021-06-03: 50 mg via INTRAVENOUS
  Administered 2021-06-03: 10 mg via INTRAVENOUS

## 2021-06-03 MED ORDER — PROPOFOL 10 MG/ML IV BOLUS
INTRAVENOUS | Status: AC
  Filled 2021-06-03: qty 20

## 2021-06-03 MED ORDER — HYDROMORPHONE HCL 1 MG/ML IJ SOLN
INTRAMUSCULAR | Status: AC
  Filled 2021-06-03: qty 1

## 2021-06-03 MED ORDER — LIDOCAINE 2% (20 MG/ML) 5 ML SYRINGE
INTRAMUSCULAR | Status: AC
  Filled 2021-06-03: qty 5

## 2021-06-03 MED ORDER — DEXAMETHASONE SODIUM PHOSPHATE 10 MG/ML IJ SOLN
INTRAMUSCULAR | Status: DC | PRN
  Administered 2021-06-03: 10 mg via INTRAVENOUS

## 2021-06-03 MED ORDER — ACETAMINOPHEN 500 MG PO TABS
1000.0000 mg | ORAL_TABLET | Freq: Four times a day (QID) | ORAL | Status: DC
  Administered 2021-06-03 – 2021-06-05 (×5): 1000 mg via ORAL
  Filled 2021-06-03 (×8): qty 2

## 2021-06-03 MED ORDER — CHLORHEXIDINE GLUCONATE 0.12 % MT SOLN
15.0000 mL | Freq: Once | OROMUCOSAL | Status: AC
  Administered 2021-06-03: 15 mL via OROMUCOSAL
  Filled 2021-06-03: qty 15

## 2021-06-03 MED ORDER — LACTATED RINGERS IV SOLN: INTRAVENOUS | Status: DC

## 2021-06-03 MED ORDER — ONDANSETRON HCL 4 MG/2ML IJ SOLN
4.0000 mg | Freq: Four times a day (QID) | INTRAMUSCULAR | Status: DC | PRN
  Administered 2021-06-03: 4 mg via INTRAVENOUS
  Filled 2021-06-03: qty 2

## 2021-06-03 MED ORDER — MORPHINE SULFATE (PF) 2 MG/ML IV SOLN
2.0000 mg | INTRAVENOUS | Status: DC | PRN
  Administered 2021-06-03: 2 mg via INTRAVENOUS
  Administered 2021-06-03: 4 mg via INTRAVENOUS
  Filled 2021-06-03: qty 1
  Filled 2021-06-03: qty 2

## 2021-06-03 MED ORDER — SUGAMMADEX SODIUM 200 MG/2ML IV SOLN
INTRAVENOUS | Status: DC | PRN
  Administered 2021-06-03: 200 mg via INTRAVENOUS

## 2021-06-03 MED ORDER — AMISULPRIDE (ANTIEMETIC) 5 MG/2ML IV SOLN: 10.0000 mg | Freq: Once | INTRAVENOUS | Status: DC | PRN

## 2021-06-03 MED ORDER — ROCURONIUM BROMIDE 10 MG/ML (PF) SYRINGE
PREFILLED_SYRINGE | INTRAVENOUS | Status: AC
  Filled 2021-06-03: qty 10

## 2021-06-03 MED ORDER — ORAL CARE MOUTH RINSE: 15.0000 mL | Freq: Once | OROMUCOSAL | Status: AC

## 2021-06-03 MED ORDER — OXYCODONE HCL 5 MG PO TABS: 5.0000 mg | ORAL_TABLET | Freq: Once | ORAL | Status: DC | PRN

## 2021-06-03 MED ORDER — CEFAZOLIN SODIUM-DEXTROSE 2-4 GM/100ML-% IV SOLN
INTRAVENOUS | Status: AC
  Filled 2021-06-03: qty 100

## 2021-06-03 MED ORDER — MIDAZOLAM HCL 2 MG/2ML IJ SOLN
INTRAMUSCULAR | Status: DC | PRN
  Administered 2021-06-03: 2 mg via INTRAVENOUS

## 2021-06-03 MED ORDER — FENTANYL CITRATE (PF) 100 MCG/2ML IJ SOLN
25.0000 ug | INTRAMUSCULAR | Status: DC | PRN
  Administered 2021-06-03 (×3): 50 ug via INTRAVENOUS

## 2021-06-03 MED ORDER — HYDRALAZINE HCL 20 MG/ML IJ SOLN
INTRAMUSCULAR | Status: AC
  Filled 2021-06-03: qty 1

## 2021-06-03 MED ORDER — FENTANYL CITRATE (PF) 100 MCG/2ML IJ SOLN
INTRAMUSCULAR | Status: AC
  Filled 2021-06-03: qty 2

## 2021-06-03 MED ORDER — MIDAZOLAM HCL 2 MG/2ML IJ SOLN
INTRAMUSCULAR | Status: AC
  Filled 2021-06-03: qty 2

## 2021-06-03 MED ORDER — DIPHENHYDRAMINE HCL 50 MG/ML IJ SOLN: 25.0000 mg | Freq: Four times a day (QID) | INTRAMUSCULAR | Status: DC | PRN

## 2021-06-03 MED ORDER — 0.9 % SODIUM CHLORIDE (POUR BTL) OPTIME
TOPICAL | Status: DC | PRN
  Administered 2021-06-03 (×2): 1000 mL

## 2021-06-03 MED ORDER — ONDANSETRON HCL 4 MG/2ML IJ SOLN
INTRAMUSCULAR | Status: DC | PRN
  Administered 2021-06-03: 4 mg via INTRAVENOUS

## 2021-06-03 MED ORDER — MEPERIDINE HCL 25 MG/ML IJ SOLN: 6.2500 mg | INTRAMUSCULAR | Status: DC | PRN

## 2021-06-03 MED ORDER — FENTANYL CITRATE (PF) 250 MCG/5ML IJ SOLN
INTRAMUSCULAR | Status: AC
Start: 2021-06-03 — End: ?
  Filled 2021-06-03: qty 5

## 2021-06-03 MED ORDER — ONDANSETRON HCL 4 MG/2ML IJ SOLN
INTRAMUSCULAR | Status: AC
  Filled 2021-06-03: qty 2

## 2021-06-03 MED ORDER — CEFAZOLIN SODIUM-DEXTROSE 2-4 GM/100ML-% IV SOLN
2.0000 g | Freq: Three times a day (TID) | INTRAVENOUS | Status: AC
  Administered 2021-06-03: 2 g via INTRAVENOUS
  Filled 2021-06-03 (×2): qty 100

## 2021-06-03 MED ORDER — ACETAMINOPHEN 500 MG PO TABS: 1000.0000 mg | ORAL_TABLET | ORAL | Status: DC

## 2021-06-03 MED ORDER — DIPHENHYDRAMINE HCL 25 MG PO CAPS: 25.0000 mg | ORAL_CAPSULE | Freq: Four times a day (QID) | ORAL | Status: DC | PRN

## 2021-06-03 MED ORDER — FENTANYL CITRATE (PF) 250 MCG/5ML IJ SOLN
INTRAMUSCULAR | Status: DC | PRN
  Administered 2021-06-03 (×2): 50 ug via INTRAVENOUS
  Administered 2021-06-03: 100 ug via INTRAVENOUS
  Administered 2021-06-03: 50 ug via INTRAVENOUS

## 2021-06-03 MED ORDER — ENOXAPARIN SODIUM 40 MG/0.4ML IJ SOSY
40.0000 mg | PREFILLED_SYRINGE | INTRAMUSCULAR | Status: DC
  Administered 2021-06-04 – 2021-06-05 (×2): 40 mg via SUBCUTANEOUS
  Filled 2021-06-03 (×2): qty 0.4

## 2021-06-03 MED ORDER — HYDRALAZINE HCL 20 MG/ML IJ SOLN
10.0000 mg | Freq: Once | INTRAMUSCULAR | Status: AC
  Administered 2021-06-03: 10 mg via INTRAVENOUS

## 2021-06-03 MED ORDER — BUPIVACAINE HCL (PF) 0.25 % IJ SOLN
INTRAMUSCULAR | Status: AC
  Filled 2021-06-03: qty 30

## 2021-06-03 SURGICAL SUPPLY — 74 items
BAG COUNTER SPONGE SURGICOUNT (BAG) ×3 IMPLANT
BLADE CLIPPER SURG (BLADE) IMPLANT
CANISTER SUCT 3000ML PPV (MISCELLANEOUS) ×3 IMPLANT
CHLORAPREP W/TINT 26 (MISCELLANEOUS) ×3 IMPLANT
COVER SURGICAL LIGHT HANDLE (MISCELLANEOUS) ×3 IMPLANT
DERMABOND ADVANCED (GAUZE/BANDAGES/DRESSINGS) ×1
DERMABOND ADVANCED .7 DNX12 (GAUZE/BANDAGES/DRESSINGS) ×2 IMPLANT
DEVICE SECURE STRAP 25 ABSORB (INSTRUMENTS) ×3 IMPLANT
DEVICE TROCAR PUNCTURE CLOSURE (ENDOMECHANICALS) ×3 IMPLANT
DRAPE LAPAROSCOPIC ABDOMINAL (DRAPES) ×3 IMPLANT
DRAPE WARM FLUID 44X44 (DRAPES) ×3 IMPLANT
DRSG MEPILEX BORDER 4X12 (GAUZE/BANDAGES/DRESSINGS) IMPLANT
DRSG MEPILEX BORDER 4X8 (GAUZE/BANDAGES/DRESSINGS) IMPLANT
ELECT CAUTERY BLADE 6.4 (BLADE) ×6 IMPLANT
ELECT REM PT RETURN 9FT ADLT (ELECTROSURGICAL) ×3
ELECTRODE REM PT RTRN 9FT ADLT (ELECTROSURGICAL) ×2 IMPLANT
GAUZE SPONGE 4X4 12PLY STRL (GAUZE/BANDAGES/DRESSINGS) ×3 IMPLANT
GLOVE SURG SIGNA 7.5 PF LTX (GLOVE) ×6 IMPLANT
GLOVE SURG SYN 7.5  E (GLOVE) ×1
GLOVE SURG SYN 7.5 E (GLOVE) ×2 IMPLANT
GLOVE SURG SYN 7.5 PF PI (GLOVE) ×1 IMPLANT
GOWN STRL REUS W/ TWL LRG LVL3 (GOWN DISPOSABLE) ×4 IMPLANT
GOWN STRL REUS W/ TWL XL LVL3 (GOWN DISPOSABLE) ×2 IMPLANT
GOWN STRL REUS W/TWL LRG LVL3 (GOWN DISPOSABLE) ×2
GOWN STRL REUS W/TWL XL LVL3 (GOWN DISPOSABLE) ×1
HANDLE SUCTION POOLE (INSTRUMENTS) ×3 IMPLANT
KIT BASIN OR (CUSTOM PROCEDURE TRAY) ×3 IMPLANT
KIT TURNOVER KIT B (KITS) ×3 IMPLANT
LIGASURE IMPACT 36 18CM CVD LR (INSTRUMENTS) ×3 IMPLANT
MARKER SKIN DUAL TIP RULER LAB (MISCELLANEOUS) ×3 IMPLANT
NDL INSUFFLATION 14GA 120MM (NEEDLE) ×1 IMPLANT
NDL SPNL 22GX3.5 QUINCKE BK (NEEDLE) IMPLANT
NEEDLE INSUFFLATION 14GA 120MM (NEEDLE) ×3 IMPLANT
NEEDLE SPNL 22GX3.5 QUINCKE BK (NEEDLE) IMPLANT
NS IRRIG 1000ML POUR BTL (IV SOLUTION) ×6 IMPLANT
PACK GENERAL/GYN (CUSTOM PROCEDURE TRAY) ×3 IMPLANT
PAD ARMBOARD 7.5X6 YLW CONV (MISCELLANEOUS) ×6 IMPLANT
PENCIL SMOKE EVACUATOR (MISCELLANEOUS) ×3 IMPLANT
RELOAD PROXIMATE 75MM BLUE (ENDOMECHANICALS) ×6 IMPLANT
RELOAD STAPLE 75 3.8 BLU REG (ENDOMECHANICALS) IMPLANT
SCISSORS LAP 5X35 DISP (ENDOMECHANICALS) ×3 IMPLANT
SET IRRIG TUBING LAPAROSCOPIC (IRRIGATION / IRRIGATOR) IMPLANT
SET TUBE SMOKE EVAC HIGH FLOW (TUBING) ×3 IMPLANT
SLEEVE ENDOPATH XCEL 5M (ENDOMECHANICALS) ×5 IMPLANT
SPECIMEN JAR LARGE (MISCELLANEOUS) ×3 IMPLANT
SPONGE T-LAP 18X18 ~~LOC~~+RFID (SPONGE) ×2 IMPLANT
STAPLER GUN LINEAR PROX 60 (STAPLE) ×2 IMPLANT
STAPLER PROXIMATE 75MM BLUE (STAPLE) ×2 IMPLANT
STAPLER VISISTAT 35W (STAPLE) ×3 IMPLANT
SUCTION POOLE HANDLE (INSTRUMENTS) ×6
SUT CHROMIC 2 0 SH (SUTURE) ×3 IMPLANT
SUT ETHIBOND 0 MO6 C/R (SUTURE) IMPLANT
SUT MNCRL AB 4-0 PS2 18 (SUTURE) ×3 IMPLANT
SUT NOVA 1 T20/GS 25DT (SUTURE) IMPLANT
SUT PDS AB 1 CTX 36 (SUTURE) ×4 IMPLANT
SUT PDS AB 1 TP1 96 (SUTURE) ×6 IMPLANT
SUT PROLENE 2 0 KS (SUTURE) ×6 IMPLANT
SUT SILK 2 0 (SUTURE) ×1
SUT SILK 2 0 SH CR/8 (SUTURE) ×3 IMPLANT
SUT SILK 2 0 TIES 10X30 (SUTURE) ×3 IMPLANT
SUT SILK 2-0 18XBRD TIE 12 (SUTURE) ×1 IMPLANT
SUT SILK 3 0 SH CR/8 (SUTURE) ×3 IMPLANT
SUT SILK 3 0 TIES 10X30 (SUTURE) ×3 IMPLANT
SYR BULB EAR ULCER 3OZ GRN STR (SYRINGE) ×2 IMPLANT
TOWEL GREEN STERILE (TOWEL DISPOSABLE) ×3 IMPLANT
TOWEL GREEN STERILE FF (TOWEL DISPOSABLE) ×3 IMPLANT
TRAY FOLEY MTR SLVR 14FR STAT (SET/KITS/TRAYS/PACK) IMPLANT
TRAY LAPAROSCOPIC MC (CUSTOM PROCEDURE TRAY) ×3 IMPLANT
TROCAR XCEL BLUNT TIP 100MML (ENDOMECHANICALS) IMPLANT
TROCAR XCEL NON-BLD 11X100MML (ENDOMECHANICALS) IMPLANT
TROCAR XCEL NON-BLD 5MMX100MML (ENDOMECHANICALS) ×3 IMPLANT
WARMER LAPAROSCOPE (MISCELLANEOUS) ×1 IMPLANT
WATER STERILE IRR 1000ML POUR (IV SOLUTION) ×3 IMPLANT
YANKAUER SUCT BULB TIP NO VENT (SUCTIONS) ×3 IMPLANT

## 2021-06-03 NOTE — Anesthesia Procedure Notes (Signed)
Procedure Name: Intubation ?Date/Time: 06/03/2021 11:11 AM ?Performed by: Ezequiel Kayser, CRNA ?Pre-anesthesia Checklist: Patient identified, Emergency Drugs available, Suction available and Patient being monitored ?Patient Re-evaluated:Patient Re-evaluated prior to induction ?Oxygen Delivery Method: Circle System Utilized ?Preoxygenation: Pre-oxygenation with 100% oxygen ?Induction Type: IV induction ?Ventilation: Mask ventilation without difficulty, Oral airway inserted - appropriate to patient size and Two handed mask ventilation required ?Laryngoscope Size: Mac and 4 ?Grade View: Grade I ?Tube type: Oral ?Tube size: 7.0 mm ?Number of attempts: 1 ?Airway Equipment and Method: Stylet and Oral airway ?Placement Confirmation: ETT inserted through vocal cords under direct vision, positive ETCO2 and breath sounds checked- equal and bilateral ?Secured at: 24 cm ?Tube secured with: Tape ?Dental Injury: Teeth and Oropharynx as per pre-operative assessment  ? ? ? ? ?

## 2021-06-03 NOTE — Transfer of Care (Signed)
Immediate Anesthesia Transfer of Care Note ? ?Patient: Jose Shaffer ? ?Procedure(s) Performed: Diagnostic laparoscopy (Abdomen) ?SMALL BOWEL RESECTION (Abdomen) ?EXPLORATORY LAPAROTOMY (Abdomen) ? ?Patient Location: PACU ? ?Anesthesia Type:General ? ?Level of Consciousness: drowsy ? ?Airway & Oxygen Therapy: Patient Spontanous Breathing and Patient connected to face mask oxygen ? ?Post-op Assessment: Report given to RN and Post -op Vital signs reviewed and stable ? ?Post vital signs: Reviewed and stable ? ?Last Vitals:  ?Vitals Value Taken Time  ?BP 153/98 06/03/21 1238  ?Temp    ?Pulse 95 06/03/21 1241  ?Resp 14 06/03/21 1241  ?SpO2 92 % 06/03/21 1241  ?Vitals shown include unvalidated device data. ? ?Last Pain:  ?Vitals:  ? 06/03/21 1004  ?TempSrc:   ?PainSc: 0-No pain  ?   ? ?  ? ?Complications: No notable events documented. ?

## 2021-06-03 NOTE — Progress Notes (Signed)
Patient can't tolerated Tylenol. He refused it this morning in short stay. Patient verbalized that he has the same symptoms when he is taking Ibuprofen. Dr. Lissa Hoard notified. ?

## 2021-06-03 NOTE — Op Note (Signed)
? ?Jose Shaffer ?06/03/2021 ? ? ?Pre-op Diagnosis: Recurrent small bowel obstruction ?    ?Post-op Diagnosis: same ? ?Procedure(s): ?Diagnostic laparoscopy ?Laparoscopic lysis of adhesions ?Exploratory laparotomy ?Small bowel resection ? ?Surgeon(s): ?[name], MD ? ?Anesthesia: General ? ?Staff:  ?Circulator: Margy Clarks, RN ?Scrub Person: Serita Grammes; Celene Squibb, RN ? ?Estimated Blood Loss: Minimal ?              ?Specimens: small bowel sent to path ? ?Indications: This is a 51 year old gentleman who had an umbilical hernia repair with mesh as well as bilateral laparoscopic inguinal hernia repair with mesh in 2016.  In the past year, he has developed a small bowel obstruction requiring admission to the hospital least 2-3 times.  A CT scan shows a loop of small intestines intimately involved with the prior umbilical hernia repair.  The decision was made to proceed to the operating room for laparoscopy given his multiple recurrences ? ?Findings: The patient was found to have a loop of small bowel completely fixated to the mesh at the umbilicus.  Lyse adhesions was carried out to free up the bowel but it was so intimately involved the mesh had to be removed with it.  There appeared to be stricturing of the bowel at this area and a fistula cannot be ruled out so approximately 3 to 4 inches of small bowel was removed. ? ?Procedure: The patient was brought to the operating room and identifies the correct patient.  He was placed upon the operating table and general anesthesia was induced.  His abdomen was prepped and draped in usual sterile fashion.  I made a small incision in the patient's left upper quadrant with a scalpel and then used the 5 mm trocar and Optiview camera to slowly traversed all layers of the abdominal wall and gained entrance into the abdominal cavity.  Insufflation of the abdomen was begun.  I evaluated the trocar site and saw no evidence of injury.  A loop of small  bowel could easily be seen going up to the previous hernia.  The umbilicus and coming back down.  There were no other intra-abdominal adhesions.  I placed two 5 mm trocars in the patient's left mid and lower quadrant.  I then began lysing adhesions of the bowel to the mesh.  It was so intimately involved with the mesh that I had to excise part of the mesh with the bowel try to free up from the abdominal wall.  At this point once the bowel was freed from the abdominal wall the decision was made to convert to an open procedure to evaluate the loop of bowel to rule injury.  I created a small midline incision at the umbilicus through the previous scar.  I carried this down to the fascia which was then open.  I excised the remaining mesh that was fixated to the peritoneal surface with the cautery.  I then eviscerated the small bowel in question.  Again, mesh was intimately involved.  It was difficult to tell if there was a fistula present or if I created a small enterotomy but there was stricturing of the bowel in this area.  The decision was made to proceed with a small bowel resection.  I transected the small bowel proximal and distal to this area with a GIA 75 stapler.  I then took down the mesentery with Kelly clamps and 2-0 silk sutures.  The piece of small bowel with mesh was then sent to pathology for evaluation.  It was approximately 3 to 4 inches.  I reapproximated the small bowel in a side-to-side fashion with silk sutures.  I then created 2 separate enterotomies with the cautery and created a side-to-side anastomosis with a single firing of the GIA 75 stapler.  The opening was then closed with a TX 60 stapler.  The staple line was reinforced with silk sutures.  The mesenteric defect was likewise closed with silk sutures.  The small bowel was then returned back into the abdominal cavity.  The anastomosis appeared pink and well perfused.  We then irrigated the abdomen with a liter normal saline.  Hemostasis  appeared to be achieved.  I then closed the fascial defect at the umbilicus with a running #1 PDS suture.  We irrigated the skin and subcutaneous tissue and then closed the incision with skin staples.  The trocars have been removed from the port sites and the port sites were closed with 4-0 Monocryl sutures and Dermabond.  The patient tolerated the procedure well.  All the counts were correct at the end of the procedure.  The patient was then extubated in the operating room and taken in stable condition to the recovery room. ?        ? ?[name]  ? ?Date: 06/03/2021  Time: 12:22 PM ? ? ? ?

## 2021-06-03 NOTE — Interval H&P Note (Signed)
History and Physical Interval Note:no change in H and P ? ?06/03/2021 ?10:26 AM ? ?Jose Shaffer  has presented today for surgery, with the diagnosis of SMALL BOWEL OBSTRUCTION, RECURRENT UMBILICAL HERNIA.  The various methods of treatment have been discussed with the patient and family. After consideration of risks, benefits and other options for treatment, the patient has consented to  Procedure(s): ?LAPAROSCOPIC UMBILICAL HERNIA REPAIR WITH MESH POSSIBLE OPEN WITH LYSIS OF ADHESION (N/A) ?POSSIBLE SMALL BOWEL RESECTION (N/A) as a surgical intervention.  The patient's history has been reviewed, patient examined, no change in status, stable for surgery.  I have reviewed the patient's chart and labs.  Questions were answered to the patient's satisfaction.   ? ? ?[name] ? ? ?

## 2021-06-04 ENCOUNTER — Encounter (HOSPITAL_COMMUNITY): Payer: Self-pay | Admitting: Surgery

## 2021-06-04 LAB — BASIC METABOLIC PANEL
Anion gap: 7 (ref 5–15)
BUN: 8 mg/dL (ref 6–20)
CO2: 24 mmol/L (ref 22–32)
Calcium: 8.6 mg/dL — ABNORMAL LOW (ref 8.9–10.3)
Chloride: 103 mmol/L (ref 98–111)
Creatinine, Ser: 1.09 mg/dL (ref 0.61–1.24)
GFR, Estimated: >60 mL/min (ref >60)
Glucose, Bld: 162 mg/dL — ABNORMAL HIGH (ref 70–99)
Potassium: 4.4 mmol/L (ref 3.5–5.1)
Sodium: 134 mmol/L — ABNORMAL LOW (ref 135–145)

## 2021-06-04 LAB — CBC
HCT: 50.6 % (ref 39.0–52.0)
Hemoglobin: 16.4 g/dL (ref 13.0–17.0)
MCH: 28.3 pg (ref 26.0–34.0)
MCHC: 32.4 g/dL (ref 30.0–36.0)
MCV: 87.4 fL (ref 80.0–100.0)
Platelets: 368 10*3/uL (ref 150–400)
RBC: 5.79 MIL/uL (ref 4.22–5.81)
RDW: 14 % (ref 11.5–15.5)
WBC: 15.1 10*3/uL — ABNORMAL HIGH (ref 4.0–10.5)
nRBC: 0 % (ref 0.0–0.2)

## 2021-06-04 MED ORDER — OXYCODONE HCL 5 MG PO TABS: 5.0000 mg | ORAL_TABLET | Freq: Four times a day (QID) | ORAL | 0 refills | Status: DC | PRN

## 2021-06-04 MED ORDER — CHLORPROMAZINE HCL 25 MG PO TABS
50.0000 mg | ORAL_TABLET | Freq: Three times a day (TID) | ORAL | Status: DC | PRN
  Administered 2021-06-04: 50 mg via ORAL
  Filled 2021-06-04 (×3): qty 2

## 2021-06-04 NOTE — Progress Notes (Signed)
1 Day Post-Op  ? ?Subjective/Chief Complaint: ?C/o hiccups that just started this morning after taking pain meds.  He reports that this has happened in the past ?Pain controlled ?No nausea, no flatus ? ? ?Objective: ?Vital signs in last 24 hours: ?Temp:  [97.4 ?F (36.3 ?C)-98.6 ?F (37 ?C)] 97.9 ?F (36.6 ?C) (03/24 0144) ?Pulse Rate:  [90-102] 92 (03/24 0144) ?Resp:  [10-18] 16 (03/24 0144) ?BP: (126-169)/(86-112) 126/87 (03/24 0144) ?SpO2:  [92 %-100 %] 97 % (03/24 0144) ?Weight:  [662 kg] 108 kg (03/23 0945) ?  ? ?Intake/Output from previous day: ?03/23 0701 - 03/24 0700 ?In: 2322.4 [I.V.:2222.4; IV Piggyback:100] ?Out: 1200 [Urine:1180; Blood:20] ?Intake/Output this shift: ?No intake/output data recorded. ? ?Exam: ?Awake and alert ?Up in chair. Looks comfortable ?Abdomen soft, dressing dry ? ?Lab Results:  ?Recent Labs  ?  06/01/21 ?9476 06/04/21 ?0146  ?WBC 10.0 15.1*  ?HGB 17.5* 16.4  ?HCT 54.9* 50.6  ?PLT 339 368  ? ?BMET ?Recent Labs  ?  06/01/21 ?5465 06/04/21 ?0146  ?NA 138 134*  ?K 4.2 4.4  ?CL 103 103  ?CO2 27 24  ?GLUCOSE 112* 162*  ?BUN 12 8  ?CREATININE 1.17 1.09  ?CALCIUM 9.1 8.6*  ? ?PT/INR ?No results for input(s): LABPROT, INR in the last 72 hours. ?ABG ?No results for input(s): PHART, HCO3 in the last 72 hours. ? ?Invalid input(s): PCO2, PO2 ? ?Studies/Results: ?No results found. ? ?Anti-infectives: ?Anti-infectives (From admission, onward)  ? ? Start     Dose/Rate Route Frequency Ordered Stop  ? 06/03/21 2000  ceFAZolin (ANCEF) IVPB 2g/100 mL premix       ? 2 g ?200 mL/hr over 30 Minutes Intravenous Every 8 hours 06/03/21 1529 06/03/21 2042  ? 06/03/21 0946  ceFAZolin (ANCEF) 2-4 GM/100ML-% IVPB       ?Note to Pharmacy: [facility], Edgardo Roys: cabinet override  ?    06/03/21 0946 06/03/21 1116  ? 06/03/21 0945  ceFAZolin (ANCEF) IVPB 2g/100 mL premix       ? 2 g ?200 mL/hr over 30 Minutes Intravenous On call to O.R. 06/03/21 0942 06/03/21 1143  ? ?  ? ? ?Assessment/Plan: ?s/p Procedure(s): ?Diagnostic  laparoscopy (N/A) ?SMALL BOWEL RESECTION (N/A) ?EXPLORATORY LAPAROTOMY (N/A) ? ?Keep on clears ?Ambulate ?Pain control ?Decrease IVF ? LOS: 1 day  ? ? ?Abigail Miyamoto ?06/04/2021 ? ?

## 2021-06-04 NOTE — TOC Progression Note (Signed)
Transition of Care (TOC) - Progression Note  ? ? ?Patient Details  ?Name: Roark Holik ?MRN: [id number] ?Date of Birth: Apr 27, 1970 ? ?Transition of Care (TOC) CM/SW Contact  ?Marilu Favre, RN ?Phone Number: ?06/04/2021, 11:05 AM ? ?Clinical Narrative:    ? ? ? ?Transition of Care (TOC) Screening Note ? ? ?Patient Details  ?Name: Aki Dibella ?Date of Birth: Jul 02, 1970 ? ? ? ?Transition of Care Department [facility]) has reviewed patient and no TOC needs have been identified at this time. We will continue to monitor patient advancement through interdisciplinary progression rounds. If new patient transition needs arise, please place a TOC consult. ?  ?  ?  ? ?Expected Discharge Plan and Services ?  ?  ?  ?  ?  ?                ?  ?  ?  ?  ?  ?  ?  ?  ?  ?  ? ? ?Social Determinants of Health (SDOH) Interventions ?  ? ?Readmission Risk Interventions ?   ? View : No data to display.  ?  ?  ?  ? ? ?

## 2021-06-04 NOTE — Anesthesia Postprocedure Evaluation (Signed)
Anesthesia Post Note ? ?Patient: Jose Shaffer ? ?Procedure(s) Performed: Diagnostic laparoscopy (Abdomen) ?SMALL BOWEL RESECTION (Abdomen) ?EXPLORATORY LAPAROTOMY (Abdomen) ? ?  ? ?Patient location during evaluation: PACU ?Anesthesia Type: General ?Level of consciousness: sedated and patient cooperative ?Pain management: pain level controlled ?Vital Signs Assessment: post-procedure vital signs reviewed and stable ?Respiratory status: spontaneous breathing ?Cardiovascular status: stable ?Anesthetic complications: no ? ? ?No notable events documented. ? ?Last Vitals:  ?Vitals:  ? 06/04/21 0823 06/04/21 1522  ?BP: (!) 138/91 126/71  ?Pulse: 98 99  ?Resp: 15 17  ?Temp: 36.8 ?C 36.8 ?C  ?SpO2: 98% 96%  ?  ?Last Pain:  ?Vitals:  ? 06/04/21 1522  ?TempSrc: Oral  ?PainSc:   ? ? ?  ?  ?  ?  ?  ?  ? ?Nolon Nations ? ? ? ? ?

## 2021-06-04 NOTE — Discharge Instructions (Signed)
[facility]      [facility], [place] ?[phone] ? ?OPEN ABDOMINAL SURGERY: POST OP INSTRUCTIONS ? ?Always review your discharge instruction sheet given to you by the facility where your surgery was performed. ? ?IF YOU HAVE DISABILITY OR FAMILY LEAVE FORMS, YOU MUST BRING THEM TO THE OFFICE FOR PROCESSING.  PLEASE DO NOT GIVE THEM TO YOUR DOCTOR. ? ?A prescription for pain medication may be given to you upon discharge.  Take your pain medication as prescribed, if needed.  If narcotic pain medicine is not needed, then you may take acetaminophen (Tylenol) or ibuprofen (Advil) as needed. ?Take your usually prescribed medications unless otherwise directed. ?If you need a refill on your pain medication, please contact your pharmacy. They will contact our office to request authorization.  Prescriptions will not be filled after 5pm or on week-ends. ?You should follow a light diet the first few days after arrival home, such as soup and crackers, pudding, etc.unless your doctor has advised otherwise. A high-fiber, low fat diet can be resumed as tolerated.   Be sure to include lots of fluids daily. Most patients will experience some swelling and bruising on the chest and neck area.  Ice packs will help.  Swelling and bruising can take several days to resolve ?Most patients will experience some swelling and bruising in the area of the incision. Ice pack will help. Swelling and bruising can take several days to resolve.Marland Kitchen  ?It is common to experience some constipation if taking pain medication after surgery.  Increasing fluid intake and taking a stool softener will usually help or prevent this problem from occurring.  A mild laxative (Milk of Magnesia or Miralax) should be taken according to package directions if there are no bowel movements after 48 hours. ? You may have steri-strips (small skin tapes) in place directly over the incision.  These strips should be left on the skin for 7-10 days.  If your surgeon used skin  glue on the incision, you may shower in 24 hours.  The glue will flake off over the next 2-3 weeks.  Any sutures or staples will be removed at the office during your follow-up visit. You may find that a light gauze bandage over your incision may keep your staples from being rubbed or pulled. You may shower and replace the bandage daily. ?ACTIVITIES:  You may resume regular (light) daily activities beginning the next day--such as daily self-care, walking, climbing stairs--gradually increasing activities as tolerated.  You may have sexual intercourse when it is comfortable.  Refrain from any heavy lifting or straining until approved by your doctor. ?You may drive when you no longer are taking prescription pain medication, you can comfortably wear a seatbelt, and you can safely maneuver your car and apply brakes ?Return to Work: ___________________________________ ?You should see your doctor in the office for a follow-up appointment approximately two weeks after your surgery.  Make sure that you call for this appointment within a day or two after you arrive home to insure a convenient appointment time. ?OTHER INSTRUCTIONS: NO LIFTING MORE THAN 15 TO 20 POUNDS FOR 4 TO 6 WEEKS ? ?OK TO SHOWER. ?OK TO REMOVE THE BANDAGE Sunday ?STAY ON A LIGHT DIET UNTIL MONDAY ? ? ?_____________________________________________________________ ?_____________________________________________________________ ? ?WHEN TO CALL YOUR DOCTOR: ?Fever over 101.0 ?Inability to urinate ?Nausea and/or vomiting ?Extreme swelling or bruising ?Continued bleeding from incision. ?Increased pain, redness, or drainage from the incision. ?Difficulty swallowing or breathing ?Muscle cramping or spasms. ?Numbness or tingling in hands or  feet or around lips. ? ?The clinic staff is available to answer your questions during regular business hours.  Please don?t hesitate to call and ask to speak to one of the nurses if you have concerns. ? ?For further questions,  please visit [email or web address]  ?

## 2021-06-05 MED ORDER — SODIUM CHLORIDE 0.9 % IV SOLN: 250.0000 mL | INTRAVENOUS | Status: DC | PRN

## 2021-06-05 MED ORDER — SODIUM CHLORIDE 0.9% FLUSH: 3.0000 mL | INTRAVENOUS | Status: DC | PRN

## 2021-06-05 MED ORDER — SODIUM CHLORIDE 0.9% FLUSH
3.0000 mL | Freq: Two times a day (BID) | INTRAVENOUS | Status: DC
Start: 2021-06-05 — End: 2021-06-05

## 2021-06-05 NOTE — Discharge Summary (Signed)
[facility] ?Discharge Summary  ? ?Patient ID: ?Jose Shaffer ?MRN: [id number] ?DOB/AGE: 12/05/1970 51 y.o. ? ?Admit date: 06/03/2021 ?Discharge date: 06/05/2021 ? ?Admitting Diagnosis: ?SBO (small bowel obstruction) (HCC) [K56.609] ? ? ?Discharge Diagnosis ?SBO (small bowel obstruction) (HCC) [K56.609] ?S/p Laparoscopic lysis of adhesions, Exploratory laparotomy, Small bowel resection ? ?Consultants ?None ? ?Imaging: ?No results found. ? ?Procedures ?Dr. [name] (06/03/21): ?Diagnostic laparoscopy ?Laparoscopic lysis of adhesions ?Exploratory laparotomy ?Small bowel resection ? ?Hospital Course:  ?51 year old male who presented to [facility] for planned surgical repair of recurrent hernia with small bowel obstructions.  .  Patient was admitted and underwent procedure listed above.  Tolerated procedure well and was transferred to the floor. On POD2, the patient was voiding well, tolerating diet, ambulating well, pain well controlled, vital signs stable, incisions c/d/i and felt stable for discharge home. He was passing flatus and without nausea, emesis, or worsening abdominal pain. He will continue to advance his diet at home. Patient will follow up in our office in 2 weeks and knows to call with questions or concerns. ? ?Physical Exam: ?General:  Alert, NAD, pleasant, comfortable sitting up in chair ?Abd:  Soft, ND, mild tenderness, lap incisions C/D/I with surgical glue - no surrounding erythema or drainage, midline incision with honeycomb bandage c/d/i. Staples visible intact. No surrounding erythema  ? ?I or a member of my team have reviewed this patient in the Controlled Substance Database. ? ?Allergies as of 06/05/2021   ? ?   Reactions  ? Ibuprofen Other (See Comments)  ? Per patient feels like KNOT in throat and itching with rash  ? Tramadol Anxiety  ? ?  ? ?  ?Medication List  ?  ? ?TAKE these medications   ? ?BENEFIBER PO ?Take 5 mLs by mouth 2 (two) times daily. Mix in liquid and drink ?   ?fluticasone 50 MCG/ACT nasal spray ?Commonly known as: FLONASE ?Place 1 spray into both nostrils every evening. ?What changed: when to take this ?  ?guanFACINE 2 MG Tb24 ER tablet ?Commonly known as: INTUNIV ?Take 2 mg by mouth See admin instructions. Take one tablet (2 mg) by mouth daily at 3pm ?  ?methylphenidate 50 MG CR capsule ?Commonly known as: METADATE CD ?Take 50 mg by mouth every morning. ?  ?montelukast 10 MG tablet ?Commonly known as: SINGULAIR ?TAKE 1 TABLET(10 MG) BY MOUTH AT BEDTIME ?What changed:  ?how much to take ?how to take this ?when to take this ?additional instructions ?  ?naproxen sodium 220 MG tablet ?Commonly known as: ALEVE ?Take 440 mg by mouth 2 (two) times daily as needed (pain). ?  ?oxyCODONE 5 MG immediate release tablet ?Commonly known as: Oxy IR/ROXICODONE ?Take 1-2 tablets (5-10 mg total) by mouth every 6 (six) hours as needed for moderate pain or severe pain. ?  ?TESTOSTERONE CYPIONATE IM ?Inject into the muscle every Sunday. Pt obtains med without a rx from MD ( 200 mg Q Sunday) ?  ? ?  ? ? ? ? Follow-up Information   ? ? Abigail Miyamoto, MD Follow up in 2 week(s).   ?Specialty: General Surgery ?Why: For suture removal ?can be a nurses only visit if needed ?Contact information: ?[address] ?[address] ?[place] [address] ?[phone] ? ? ?  ?  ? ?  ?  ? ?  ? ? ?Signed: ?Eric Form , PA-C ?[facility] ?06/05/2021, 10:50 AM ?Please see Amion for pager number during day hours 7:00am-4:30pm ? ? ? ?

## 2021-06-05 NOTE — Progress Notes (Signed)
2 Days Post-Op  ? ?Subjective/Chief Complaint: ?Moving bowels  ?Feels well  ? ? ?Objective: ?Vital signs in last 24 hours: ?Temp:  [97.9 ?F (36.6 ?C)-98.3 ?F (36.8 ?C)] 98.3 ?F (36.8 ?C) (03/25 6294) ?Pulse Rate:  [93-99] 93 (03/25 0716) ?Resp:  [16-18] 16 (03/25 0716) ?BP: (116-133)/(71-88) 133/88 (03/25 0716) ?SpO2:  [96 %-98 %] 96 % (03/25 0716) ?Last BM Date : 06/02/21 ? ?Intake/Output from previous day: ?03/24 0701 - 03/25 0700 ?In: 320 [P.O.:320] ?Out: -  ?Intake/Output this shift: ?No intake/output data recorded. ? ?Incision/Wound: INCISION CDI soft NT  ? ?Lab Results:  ?Recent Labs  ?  06/04/21 ?0146  ?WBC 15.1*  ?HGB 16.4  ?HCT 50.6  ?PLT 368  ? ?BMET ?Recent Labs  ?  06/04/21 ?0146  ?NA 134*  ?K 4.4  ?CL 103  ?CO2 24  ?GLUCOSE 162*  ?BUN 8  ?CREATININE 1.09  ?CALCIUM 8.6*  ? ?PT/INR ?No results for input(s): LABPROT, INR in the last 72 hours. ?ABG ?No results for input(s): PHART, HCO3 in the last 72 hours. ? ?Invalid input(s): PCO2, PO2 ? ?Studies/Results: ?No results found. ? ?Anti-infectives: ?Anti-infectives (From admission, onward)  ? ? Start     Dose/Rate Route Frequency Ordered Stop  ? 06/03/21 2000  ceFAZolin (ANCEF) IVPB 2g/100 mL premix       ? 2 g ?200 mL/hr over 30 Minutes Intravenous Every 8 hours 06/03/21 1529 06/03/21 2042  ? 06/03/21 0946  ceFAZolin (ANCEF) 2-4 GM/100ML-% IVPB       ?Note to Pharmacy: [facility], Edgardo Roys: cabinet override  ?    06/03/21 0946 06/03/21 1116  ? 06/03/21 0945  ceFAZolin (ANCEF) IVPB 2g/100 mL premix       ? 2 g ?200 mL/hr over 30 Minutes Intravenous On call to O.R. 06/03/21 0942 06/03/21 1143  ? ?  ? ? ?Assessment/Plan: ?s/p Procedure(s): ?Diagnostic laparoscopy (N/A) ?SMALL BOWEL RESECTION (N/A) ?EXPLORATORY LAPAROTOMY (N/A) ?Adv diet home Sunday  ?SL IV ? LOS: 2 days  ? ? ?Dortha Schwalbe MD  ?06/05/2021 ? ?

## 2021-06-05 NOTE — Progress Notes (Signed)
Pt discharged home in stable condition 

## 2021-06-07 LAB — SURGICAL PATHOLOGY

## 2021-09-29 ENCOUNTER — Other Ambulatory Visit (HOSPITAL_COMMUNITY): Payer: Self-pay | Admitting: Surgery

## 2021-09-29 ENCOUNTER — Other Ambulatory Visit: Payer: Self-pay | Admitting: Surgery

## 2021-09-29 DIAGNOSIS — R1033 Periumbilical pain: Secondary | ICD-10-CM

## 2021-10-01 ENCOUNTER — Encounter (HOSPITAL_BASED_OUTPATIENT_CLINIC_OR_DEPARTMENT_OTHER): Payer: Self-pay

## 2021-10-01 ENCOUNTER — Ambulatory Visit (HOSPITAL_BASED_OUTPATIENT_CLINIC_OR_DEPARTMENT_OTHER)
Admission: RE | Admit: 2021-10-01 | Discharge: 2021-10-01 | Disposition: A | Discharge status: Home / Self Care | Payer: BC Managed Care – PPO | Source: Ambulatory Visit | Attending: Surgery | Admitting: Surgery

## 2021-10-01 DIAGNOSIS — R1033 Periumbilical pain: Secondary | ICD-10-CM | POA: Diagnosis not present

## 2021-10-01 MED ORDER — IOHEXOL 300 MG/ML  SOLN
100.0000 mL | Freq: Once | INTRAMUSCULAR | Status: AC | PRN
  Administered 2021-10-01: 100 mL via INTRAVENOUS

## 2022-01-21 ENCOUNTER — Other Ambulatory Visit: Payer: Self-pay | Admitting: Surgery

## 2022-02-14 NOTE — Progress Notes (Signed)
Surgical Instructions    Your procedure is scheduled on Tuesday, December 12th, 2023.   Report to [facility] Main Entrance "A" at 05:30 A.M., then check in with the Admitting office.  Call this number if you have problems the morning of surgery:  [phone]   If you have any questions prior to your surgery date call [phone]: Open Monday-Friday 8am-4pm If you experience any cold or flu symptoms such as cough, fever, chills, shortness of breath, etc. between now and your scheduled surgery, please notify us at the above number     Remember:  Do not eat after midnight the night before your surgery  You may drink clear liquids until 04:30 the morning of your surgery.   Clear liquids allowed are: Water, Non-Citrus Juices (without pulp), Carbonated Beverages, Clear Tea, Black Coffee ONLY (NO MILK, CREAM OR POWDERED CREAMER of any kind), and Gatorade  Patient Instructions  The night before surgery:  No food after midnight. ONLY clear liquids after midnight  The day of surgery (if you do NOT have diabetes):  Drink ONE (1) Pre-Surgery Clear Ensure by 04:30 the morning of surgery. Drink in one sitting. Do not sip.  This drink was given to you during your hospital  pre-op appointment visit.  Nothing else to drink after completing the  Pre-Surgery Clear Ensure.         If you have questions, please contact your surgeon's office.     Take these medicines the morning of surgery with A SIP OF WATER: NONE   As of today, STOP taking any Aspirin (unless otherwise instructed by your surgeon) Aleve, Naproxen, Ibuprofen, Motrin, Advil, Goody's, BC's, all herbal medications, fish oil, and all vitamins.   The day of surgery:           Do not wear jewelry. Do not wear lotions, powders, cologne or deodorant. Men may shave face and neck. Do not bring valuables to the hospital.  [facility] is not responsible for any belongings or valuables.    Do NOT Smoke (Tobacco/Vaping)  24 hours  prior to your procedure  If you use a CPAP at night, you may bring your mask for your overnight stay.   Contacts, glasses, hearing aids, dentures or partials may not be worn into surgery, please bring cases for these belongings   For patients admitted to the hospital, discharge time will be determined by your treatment team.   Patients discharged the day of surgery will not be allowed to drive home, and someone needs to stay with them for 24 hours.   SURGICAL WAITING ROOM VISITATION Patients having surgery or a procedure may have no more than 2 support people in the waiting area - these visitors may rotate.   Children under the age of 21 must have an adult with them who is not the patient. If the patient needs to stay at the hospital during part of their recovery, the visitor guidelines for inpatient rooms apply. Pre-op nurse will coordinate an appropriate time for 1 support person to accompany patient in pre-op.  This support person may not rotate.   Please refer to [email or web address] for the visitor guidelines for Inpatients (after your surgery is over and you are in a regular room).    Special instructions:    Oral Hygiene is also important to reduce your risk of infection.  Remember - BRUSH YOUR TEETH THE MORNING OF SURGERY WITH YOUR REGULAR TOOTHPASTE   [facility]- Preparing For Surgery  Before surgery, you can play  an important role. Because skin is not sterile, your skin needs to be as free of germs as possible. You can reduce the number of germs on your skin by washing with CHG (chlorahexidine gluconate) Soap before surgery.  CHG is an antiseptic cleaner which kills germs and bonds with the skin to continue killing germs even after washing.     Please do not use if you have an allergy to CHG or antibacterial soaps. If your skin becomes reddened/irritated stop using the CHG.  Do not shave (including legs and underarms) for at  least 48 hours prior to first CHG shower. It is OK to shave your face.  Please follow these instructions carefully.     Shower the NIGHT BEFORE SURGERY and the MORNING OF SURGERY with CHG Soap.   If you chose to wash your hair, wash your hair first as usual with your normal shampoo. After you shampoo, rinse your hair and body thoroughly to remove the shampoo.  Then [employer] and genitals (private parts) with your normal soap and rinse thoroughly to remove soap.  After that Use CHG Soap as you would any other liquid soap. You can apply CHG directly to the skin and wash gently with a scrungie or a clean washcloth.   Apply the CHG Soap to your body ONLY FROM THE NECK DOWN.  Do not use on open wounds or open sores. Avoid contact with your eyes, ears, mouth and genitals (private parts). Wash Face and genitals (private parts)  with your normal soap.   Wash thoroughly, paying special attention to the area where your surgery will be performed.  Thoroughly rinse your body with warm water from the neck down.  DO NOT shower/wash with your normal soap after using and rinsing off the CHG Soap.  Pat yourself dry with a CLEAN TOWEL.  Wear CLEAN PAJAMAS to bed the night before surgery  Place CLEAN SHEETS on your bed the night before your surgery  DO NOT SLEEP WITH PETS.   Day of Surgery:  Take a shower with CHG soap. Wear Clean/Comfortable clothing the morning of surgery Do not apply any deodorants/lotions.   Remember to brush your teeth WITH YOUR REGULAR TOOTHPASTE.    If you received a COVID test during your pre-op visit, it is requested that you wear a mask when out in public, stay away from anyone that may not be feeling well, and notify your surgeon if you develop symptoms. If you have been in contact with anyone that has tested positive in the last 10 days, please notify your surgeon.    Please read over the following fact sheets that you were given.

## 2022-02-15 ENCOUNTER — Encounter (HOSPITAL_COMMUNITY): Payer: Self-pay

## 2022-02-15 ENCOUNTER — Other Ambulatory Visit: Payer: Self-pay

## 2022-02-15 ENCOUNTER — Encounter (HOSPITAL_COMMUNITY)
Admission: RE | Admit: 2022-02-15 | Discharge: 2022-02-15 | Disposition: A | Discharge status: Home / Self Care | Payer: BC Managed Care – PPO | Source: Ambulatory Visit | Attending: Surgery | Admitting: Surgery

## 2022-02-15 VITALS — BP 146/88 | HR 83 | Temp 97.5°F | Resp 18 | Ht 70.0 in | Wt 243.9 lb

## 2022-02-15 DIAGNOSIS — Z01818 Encounter for other preprocedural examination: Secondary | ICD-10-CM

## 2022-02-15 DIAGNOSIS — Z01812 Encounter for preprocedural laboratory examination: Secondary | ICD-10-CM | POA: Insufficient documentation

## 2022-02-15 LAB — BASIC METABOLIC PANEL
Anion gap: 8 (ref 5–15)
BUN: 13 mg/dL (ref 6–20)
CO2: 28 mmol/L (ref 22–32)
Calcium: 9.2 mg/dL (ref 8.9–10.3)
Chloride: 101 mmol/L (ref 98–111)
Creatinine, Ser: 1.18 mg/dL (ref 0.61–1.24)
GFR, Estimated: >60 mL/min (ref >60)
Glucose, Bld: 107 mg/dL — ABNORMAL HIGH (ref 70–99)
Potassium: 4.3 mmol/L (ref 3.5–5.1)
Sodium: 137 mmol/L (ref 135–145)

## 2022-02-15 LAB — CBC
HCT: 57.2 % — ABNORMAL HIGH (ref 39.0–52.0)
Hemoglobin: 18.6 g/dL — ABNORMAL HIGH (ref 13.0–17.0)
MCH: 29.1 pg (ref 26.0–34.0)
MCHC: 32.5 g/dL (ref 30.0–36.0)
MCV: 89.5 fL (ref 80.0–100.0)
Platelets: 332 10*3/uL (ref 150–400)
RBC: 6.39 MIL/uL — ABNORMAL HIGH (ref 4.22–5.81)
RDW: 14.2 % (ref 11.5–15.5)
WBC: 7.8 10*3/uL (ref 4.0–10.5)
nRBC: 0 % (ref 0.0–0.2)

## 2022-02-15 NOTE — Progress Notes (Signed)
PCP - Cannot recall Cardiologist - Denies  PPM/ICD - Denies Device Orders -  Rep Notified -   Chest x-ray - NI EKG - NI Stress Test - Denies ECHO - Denies Cardiac Cath - Denies  Sleep Study - Has OSA CPAP - Nightly  DM - Denies  Blood Thinner Instructions: Denies Aspirin Instructions:Denies  ERAS Protcol -Yes PRE-SURGERY Ensure given  COVID TEST- NI   Anesthesia review: No  Patient denies shortness of breath, fever, cough and chest pain at PAT appointment   All instructions explained to the patient, with a verbal understanding of the material. Patient agrees to go over the instructions while at home for a better understanding. The opportunity to ask questions was provided.

## 2022-02-21 NOTE — H&P (Signed)
PROVIDER: Wayne Both, MD  MRN: [id number] DOB: [birth year]  Subjective  Chief Complaint: Follow-up (possible recurrent hernia)   History of Present Illness: Jose Shaffer is a 51 y.o. male who is seen  for a long-term follow-up. I performed bilateral laparoscopic inguinal hernia repair with mesh and repair of a small umbilical hernia with mesh back in 2016. Earlier this year, he developed a small bowel obstruction from small bowel fixated to the edge of the mesh requiring a diagnostic laparoscopy with small bowel resection and removal of part of the mesh in March. He now has a recurrent hernia at the umbilicus. A CT scan in July showed this was only diastases. He reports that the bulge is getting bigger but he has no obstructive symptoms or discomfort    Review of Systems: A complete review of systems was obtained from the patient. I have reviewed this information and discussed as appropriate with the patient. See HPI as well for other ROS.  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, right Right ankle instability Sleep apnea Snoring Intestinal adhesions with obstruction, unspecified whether partial or complete (CMS-HCC) Unspecified intestinal obstruction, unspecified as to partial versus complete obstruction (CMS-HCC) OSA on CPAP  Past Surgical History: Procedure Laterality Date HERNIA REPAIR 2015   Allergies Allergen Reactions Other Hives, Rash and Swelling Ibuprofen Other (See Comments) Per patient feels like KNOT in throat and itching with  rash Per patient feels like KNOT in throat and itching with rash  Tramadol Anxiety  Current Outpatient Medications on File Prior to Visit Medication Sig Dispense Refill methylphenidate HCl (JORNAY PM) 60 mg CDES Take by mouth ADDERALL XR 10 mg XR capsule Take 10 mg by mouth once daily (Patient not taking: Reported on 01/21/2022) ADDERALL XR 20 mg XR capsule Take 20 mg by mouth once daily (Patient not taking: Reported on 01/21/2022) doxazosin (CARDURA) 4 MG tablet Take 4 mg by mouth once daily (Patient not taking: Reported on 01/21/2022) fluticasone propionate (FLONASE) 50 mcg/actuation nasal spray 1 spray by Nasal route once daily (Patient not taking: Reported on 01/21/2022) guanFACINE (INTUNIV) 1 mg ER tablet Take by mouth (Patient not taking: Reported on 01/21/2022) montelukast (SINGULAIR) 10 mg tablet Take 10 mg by mouth once daily (Patient not taking: Reported on 01/21/2022) naproxen sodium (ALEVE) 220 MG tablet Take 220 mg by mouth as needed (Patient not taking: Reported on 01/21/2022)  No current facility-administered medications on file prior to visit.  History reviewed. No pertinent family history.  Social History  Tobacco Use Smoking Status Never Smokeless Tobacco Never   Social History  Socioeconomic History Marital status: Married Tobacco Use Smoking status: Never Smokeless tobacco: Never Vaping Use Vaping Use: Never used Substance and Sexual Activity Alcohol use: Not Currently Drug use: Never  Objective:  There were no vitals filed for this visit. There is no height or weight on file to calculate BMI.  Physical Exam  He appears well on exam  There is a recurrent umbilical hernia. The fascial defect is approximately 4 cm. It is reducible  Labs, Imaging and Diagnostic Testing:  I reviewed the CT scan from July of this year  Assessment and  Plan:  Diagnoses and all orders for this visit:  Recurrent umbilical hernia    At this point we discussed  his recurrent umbilical hernia. He does a lot of heavy lifting with his work and his exercise. I recommend repairing the hernia before he gets larger or causes obstruction. We again discussed both the laparoscopic and open techniques. Given the amount of lifting that he does, I believe he needs an open repair with mesh which will allow closure of the hole and removal of the hernia sac as well given the bulge. We discussed the risk of surgery which includes but is not limited to bleeding, infection, injury to surrounding structures, the use of mesh, hernia recurrence postoperatively, need for further procedures, cardiopulmonary issues, DVT, postoperative recovery, etc. He understands and wishes to proceed with surgery which will be scheduled

## 2022-02-21 NOTE — Anesthesia Preprocedure Evaluation (Signed)
Anesthesia Evaluation  Patient identified by MRN, date of birth, ID band Patient awake    Reviewed: Allergy & Precautions, NPO status , Patient's Chart, lab work & pertinent test results  Airway Mallampati: II  TM Distance: >3 FB Neck ROM: Full    Dental no notable dental hx.    Pulmonary sleep apnea    Pulmonary exam normal        Cardiovascular negative cardio ROS  Rhythm:Regular Rate:Normal     Neuro/Psych   Anxiety     negative neurological ROS     GI/Hepatic Neg liver ROS,,,Recurrent umbilical hernia    Endo/Other  negative endocrine ROS    Renal/GU negative Renal ROS  negative genitourinary   Musculoskeletal  (+) Arthritis , Osteoarthritis,    Abdominal Normal abdominal exam  (+)   Peds  Hematology negative hematology ROS (+)   Anesthesia Other Findings   Reproductive/Obstetrics                             Anesthesia Physical Anesthesia Plan  ASA: 2  Anesthesia Plan: General   Post-op Pain Management: Celebrex PO (pre-op)* and Tylenol PO (pre-op)*   Induction: Intravenous  PONV Risk Score and Plan: 2 and Ondansetron, Dexamethasone, Midazolam and Treatment may vary due to age or medical condition  Airway Management Planned: Mask and Oral ETT  Additional Equipment: None  Intra-op Plan:   Post-operative Plan: Extubation in OR  Informed Consent: I have reviewed the patients History and Physical, chart, labs and discussed the procedure including the risks, benefits and alternatives for the proposed anesthesia with the patient or authorized representative who has indicated his/her understanding and acceptance.     Dental advisory given  Plan Discussed with: CRNA  Anesthesia Plan Comments: (Lab Results      Component                Value               Date                      WBC                      7.8                 02/15/2022                HGB                       18.6 (H)            02/15/2022                HCT                      57.2 (H)            02/15/2022                MCV                      89.5                02/15/2022                PLT  332                 02/15/2022            Lab Results      Component                Value               Date                      NA                       137                 02/15/2022                K                        4.3                 02/15/2022                CO2                      28                  02/15/2022                GLUCOSE                  107 (H)             02/15/2022                BUN                      13                  02/15/2022                CREATININE               1.18                02/15/2022                CALCIUM                  9.2                 02/15/2022                GFRNONAA                 >60                 02/15/2022           )       Anesthesia Quick Evaluation

## 2022-02-22 ENCOUNTER — Observation Stay (HOSPITAL_COMMUNITY)
Admission: RE | Admit: 2022-02-22 | Discharge: 2022-02-23 | Disposition: A | Discharge status: Home / Self Care | Payer: BC Managed Care – PPO | Source: Home / Self Care | Attending: Surgery | Admitting: Surgery

## 2022-02-22 ENCOUNTER — Ambulatory Visit (HOSPITAL_COMMUNITY): Payer: BC Managed Care – PPO | Admitting: Anesthesiology

## 2022-02-22 ENCOUNTER — Encounter (HOSPITAL_COMMUNITY): Payer: Self-pay | Admitting: Surgery

## 2022-02-22 ENCOUNTER — Encounter (HOSPITAL_COMMUNITY)
Admission: RE | Disposition: A | Discharge status: Home / Self Care | Payer: Self-pay | Source: Home / Self Care | Attending: Surgery

## 2022-02-22 ENCOUNTER — Other Ambulatory Visit: Payer: Self-pay

## 2022-02-22 DIAGNOSIS — K429 Umbilical hernia without obstruction or gangrene: Secondary | ICD-10-CM | POA: Insufficient documentation

## 2022-02-22 DIAGNOSIS — K432 Incisional hernia without obstruction or gangrene: Secondary | ICD-10-CM | POA: Diagnosis present

## 2022-02-22 DIAGNOSIS — K56609 Unspecified intestinal obstruction, unspecified as to partial versus complete obstruction: Secondary | ICD-10-CM | POA: Insufficient documentation

## 2022-02-22 HISTORY — PX: UMBILICAL HERNIA REPAIR: SHX196

## 2022-02-22 SURGERY — REPAIR, HERNIA, UMBILICAL, ADULT
Anesthesia: General | Site: Abdomen

## 2022-02-22 MED ORDER — CHLORHEXIDINE GLUCONATE CLOTH 2 % EX PADS: 6.0000 | MEDICATED_PAD | Freq: Once | CUTANEOUS | Status: DC

## 2022-02-22 MED ORDER — ROCURONIUM BROMIDE 10 MG/ML (PF) SYRINGE
PREFILLED_SYRINGE | INTRAVENOUS | Status: DC | PRN
  Administered 2022-02-22: 60 mg via INTRAVENOUS

## 2022-02-22 MED ORDER — FENTANYL CITRATE (PF) 250 MCG/5ML IJ SOLN
INTRAMUSCULAR | Status: DC | PRN
  Administered 2022-02-22 (×3): 50 ug via INTRAVENOUS

## 2022-02-22 MED ORDER — CHLORHEXIDINE GLUCONATE 0.12 % MT SOLN
OROMUCOSAL | Status: AC
  Administered 2022-02-22: 15 mL via OROMUCOSAL
  Filled 2022-02-22: qty 15

## 2022-02-22 MED ORDER — PROPOFOL 10 MG/ML IV BOLUS
INTRAVENOUS | Status: AC
  Filled 2022-02-22: qty 20

## 2022-02-22 MED ORDER — HYDROMORPHONE HCL 1 MG/ML IJ SOLN: 1.0000 mg | INTRAMUSCULAR | Status: DC | PRN

## 2022-02-22 MED ORDER — SUGAMMADEX SODIUM 200 MG/2ML IV SOLN
INTRAVENOUS | Status: DC | PRN
  Administered 2022-02-22: 200 mg via INTRAVENOUS

## 2022-02-22 MED ORDER — CEFAZOLIN SODIUM-DEXTROSE 2-4 GM/100ML-% IV SOLN
2.0000 g | INTRAVENOUS | Status: AC
  Administered 2022-02-22: 2 g via INTRAVENOUS

## 2022-02-22 MED ORDER — LIDOCAINE 2% (20 MG/ML) 5 ML SYRINGE
INTRAMUSCULAR | Status: DC | PRN
  Administered 2022-02-22: 80 mg via INTRAVENOUS

## 2022-02-22 MED ORDER — ORAL CARE MOUTH RINSE: 15.0000 mL | Freq: Once | OROMUCOSAL | Status: AC

## 2022-02-22 MED ORDER — SODIUM CHLORIDE 0.9 % IV SOLN: INTRAVENOUS | Status: DC

## 2022-02-22 MED ORDER — DIPHENHYDRAMINE HCL 50 MG/ML IJ SOLN: 25.0000 mg | Freq: Four times a day (QID) | INTRAMUSCULAR | Status: DC | PRN

## 2022-02-22 MED ORDER — ACETAMINOPHEN 500 MG PO TABS
ORAL_TABLET | ORAL | Status: AC
  Filled 2022-02-22: qty 2

## 2022-02-22 MED ORDER — FENTANYL CITRATE (PF) 250 MCG/5ML IJ SOLN
INTRAMUSCULAR | Status: AC
  Filled 2022-02-22: qty 5

## 2022-02-22 MED ORDER — ACETAMINOPHEN 500 MG PO TABS: 1000.0000 mg | ORAL_TABLET | Freq: Once | ORAL | Status: DC

## 2022-02-22 MED ORDER — FLUTICASONE PROPIONATE 50 MCG/ACT NA SUSP
1.0000 | Freq: Every evening | NASAL | Status: DC
  Filled 2022-02-22: qty 16

## 2022-02-22 MED ORDER — DEXMEDETOMIDINE HCL IN NACL 80 MCG/20ML IV SOLN
INTRAVENOUS | Status: DC | PRN
  Administered 2022-02-22 (×2): 4 ug via BUCCAL

## 2022-02-22 MED ORDER — FENTANYL CITRATE (PF) 100 MCG/2ML IJ SOLN
25.0000 ug | INTRAMUSCULAR | Status: DC | PRN
  Administered 2022-02-22 (×2): 25 ug via INTRAVENOUS

## 2022-02-22 MED ORDER — GABAPENTIN 300 MG PO CAPS
300.0000 mg | ORAL_CAPSULE | Freq: Two times a day (BID) | ORAL | Status: DC
  Administered 2022-02-22 – 2022-02-23 (×3): 300 mg via ORAL
  Filled 2022-02-22 (×3): qty 1

## 2022-02-22 MED ORDER — DEXMEDETOMIDINE HCL IN NACL 80 MCG/20ML IV SOLN
INTRAVENOUS | Status: AC
  Filled 2022-02-22: qty 20

## 2022-02-22 MED ORDER — ENOXAPARIN SODIUM 40 MG/0.4ML IJ SOSY: 40.0000 mg | PREFILLED_SYRINGE | INTRAMUSCULAR | Status: DC

## 2022-02-22 MED ORDER — ONDANSETRON HCL 4 MG/2ML IJ SOLN: 4.0000 mg | Freq: Four times a day (QID) | INTRAMUSCULAR | Status: DC | PRN

## 2022-02-22 MED ORDER — ACETAMINOPHEN 500 MG PO TABS: 1000.0000 mg | ORAL_TABLET | ORAL | Status: DC

## 2022-02-22 MED ORDER — ENSURE PRE-SURGERY PO LIQD: 296.0000 mL | Freq: Once | ORAL | Status: DC

## 2022-02-22 MED ORDER — ONDANSETRON HCL 4 MG/2ML IJ SOLN
INTRAMUSCULAR | Status: DC | PRN
  Administered 2022-02-22: 4 mg via INTRAVENOUS

## 2022-02-22 MED ORDER — DEXAMETHASONE SODIUM PHOSPHATE 10 MG/ML IJ SOLN
INTRAMUSCULAR | Status: AC
  Filled 2022-02-22: qty 1

## 2022-02-22 MED ORDER — CHLORHEXIDINE GLUCONATE 0.12 % MT SOLN: 15.0000 mL | Freq: Once | OROMUCOSAL | Status: AC

## 2022-02-22 MED ORDER — DEXAMETHASONE SODIUM PHOSPHATE 10 MG/ML IJ SOLN
INTRAMUSCULAR | Status: DC | PRN
  Administered 2022-02-22: 10 mg via INTRAVENOUS

## 2022-02-22 MED ORDER — METHYLPHENIDATE HCL ER (OSM) 27 MG PO TBCR: 54.0000 mg | EXTENDED_RELEASE_TABLET | Freq: Every day | ORAL | Status: DC

## 2022-02-22 MED ORDER — PROPOFOL 10 MG/ML IV BOLUS
INTRAVENOUS | Status: DC | PRN
  Administered 2022-02-22: 200 mg via INTRAVENOUS

## 2022-02-22 MED ORDER — LACTATED RINGERS IV SOLN: INTRAVENOUS | Status: DC

## 2022-02-22 MED ORDER — MIDAZOLAM HCL 2 MG/2ML IJ SOLN
INTRAMUSCULAR | Status: DC | PRN
  Administered 2022-02-22: 2 mg via INTRAVENOUS

## 2022-02-22 MED ORDER — FENTANYL CITRATE (PF) 100 MCG/2ML IJ SOLN
INTRAMUSCULAR | Status: AC
  Filled 2022-02-22: qty 2

## 2022-02-22 MED ORDER — 0.9 % SODIUM CHLORIDE (POUR BTL) OPTIME
TOPICAL | Status: DC | PRN
  Administered 2022-02-22: 1000 mL

## 2022-02-22 MED ORDER — CEFAZOLIN SODIUM-DEXTROSE 2-4 GM/100ML-% IV SOLN
INTRAVENOUS | Status: AC
  Filled 2022-02-22: qty 100

## 2022-02-22 MED ORDER — OXYCODONE HCL 5 MG PO TABS
5.0000 mg | ORAL_TABLET | ORAL | Status: DC | PRN
  Administered 2022-02-22 (×2): 10 mg via ORAL
  Filled 2022-02-22 (×3): qty 2

## 2022-02-22 MED ORDER — DIPHENHYDRAMINE HCL 25 MG PO CAPS: 25.0000 mg | ORAL_CAPSULE | Freq: Four times a day (QID) | ORAL | Status: DC | PRN

## 2022-02-22 MED ORDER — BUPIVACAINE-EPINEPHRINE (PF) 0.25% -1:200000 IJ SOLN
INTRAMUSCULAR | Status: AC
  Filled 2022-02-22: qty 30

## 2022-02-22 MED ORDER — ACETAMINOPHEN 325 MG PO TABS
650.0000 mg | ORAL_TABLET | Freq: Four times a day (QID) | ORAL | Status: DC | PRN
  Filled 2022-02-22: qty 2

## 2022-02-22 MED ORDER — ONDANSETRON HCL 4 MG/2ML IJ SOLN
INTRAMUSCULAR | Status: AC
  Filled 2022-02-22: qty 2

## 2022-02-22 MED ORDER — ONDANSETRON 4 MG PO TBDP: 4.0000 mg | ORAL_TABLET | Freq: Four times a day (QID) | ORAL | Status: DC | PRN

## 2022-02-22 MED ORDER — BUPIVACAINE-EPINEPHRINE 0.25% -1:200000 IJ SOLN
INTRAMUSCULAR | Status: DC | PRN
  Administered 2022-02-22: 10 mL

## 2022-02-22 MED ORDER — METHYLPHENIDATE HCL ER (CD) 50 MG PO CPCR: 50.0000 mg | ORAL_CAPSULE | Freq: Every morning | ORAL | Status: DC

## 2022-02-22 MED ORDER — MIDAZOLAM HCL 2 MG/2ML IJ SOLN
INTRAMUSCULAR | Status: AC
  Filled 2022-02-22: qty 2

## 2022-02-22 MED ORDER — METHOCARBAMOL 500 MG PO TABS
500.0000 mg | ORAL_TABLET | Freq: Four times a day (QID) | ORAL | Status: DC | PRN
  Administered 2022-02-22: 500 mg via ORAL
  Filled 2022-02-22 (×2): qty 1

## 2022-02-22 MED ORDER — ACETAMINOPHEN 650 MG RE SUPP: 650.0000 mg | Freq: Four times a day (QID) | RECTAL | Status: DC | PRN

## 2022-02-22 SURGICAL SUPPLY — 34 items
ADH SKN CLS APL DERMABOND .7 (GAUZE/BANDAGES/DRESSINGS) ×1
APL PRP STRL LF DISP 70% ISPRP (MISCELLANEOUS) ×1
BAG COUNTER SPONGE SURGICOUNT (BAG) IMPLANT
BAG SPNG CNTER NS LX DISP (BAG)
BLADE CLIPPER SURG (BLADE) IMPLANT
CANISTER SUCT 3000ML PPV (MISCELLANEOUS) IMPLANT
CHLORAPREP W/TINT 26 (MISCELLANEOUS) ×1 IMPLANT
COVER SURGICAL LIGHT HANDLE (MISCELLANEOUS) ×1 IMPLANT
DERMABOND ADVANCED .7 DNX12 (GAUZE/BANDAGES/DRESSINGS) ×1 IMPLANT
DRAPE LAPAROTOMY 100X72 PEDS (DRAPES) ×1 IMPLANT
ELECT REM PT RETURN 9FT ADLT (ELECTROSURGICAL) ×1
ELECTRODE REM PT RTRN 9FT ADLT (ELECTROSURGICAL) ×1 IMPLANT
GLOVE SURG SIGNA 7.5 PF LTX (GLOVE) ×1 IMPLANT
GOWN STRL REUS W/ TWL LRG LVL3 (GOWN DISPOSABLE) ×1 IMPLANT
GOWN STRL REUS W/ TWL XL LVL3 (GOWN DISPOSABLE) ×1 IMPLANT
GOWN STRL REUS W/TWL LRG LVL3 (GOWN DISPOSABLE) ×1
GOWN STRL REUS W/TWL XL LVL3 (GOWN DISPOSABLE) ×1
KIT BASIN OR (CUSTOM PROCEDURE TRAY) ×1 IMPLANT
KIT TURNOVER KIT B (KITS) ×1 IMPLANT
MESH VENTRALEX ST 8CM LRG (Mesh General) IMPLANT
NDL HYPO 25GX1X1/2 BEV (NEEDLE) ×1 IMPLANT
NEEDLE HYPO 25GX1X1/2 BEV (NEEDLE) ×1 IMPLANT
NS IRRIG 1000ML POUR BTL (IV SOLUTION) ×1 IMPLANT
PACK GENERAL/GYN (CUSTOM PROCEDURE TRAY) ×1 IMPLANT
PAD ARMBOARD 7.5X6 YLW CONV (MISCELLANEOUS) ×1 IMPLANT
PENCIL SMOKE EVACUATOR (MISCELLANEOUS) ×1 IMPLANT
SPIKE FLUID TRANSFER (MISCELLANEOUS) ×1 IMPLANT
SUT MNCRL AB 4-0 PS2 18 (SUTURE) ×1 IMPLANT
SUT NOVA NAB DX-16 0-1 5-0 T12 (SUTURE) ×1 IMPLANT
SUT VIC AB 3-0 SH 27 (SUTURE) ×1
SUT VIC AB 3-0 SH 27X BRD (SUTURE) ×1 IMPLANT
SYR CONTROL 10ML LL (SYRINGE) ×1 IMPLANT
TOWEL GREEN STERILE (TOWEL DISPOSABLE) ×1 IMPLANT
TOWEL GREEN STERILE FF (TOWEL DISPOSABLE) ×1 IMPLANT

## 2022-02-22 NOTE — TOC Initial Note (Signed)
Transition of Care [facility]) - Initial/Assessment Note    Patient Details  Name: Jose Shaffer MRN: [id number] Date of Birth: [birth year]  [facility] of [facility]) CM/SW Contact:    Durenda Guthrie, RN Phone Number: 02/22/2022, 3:44 PM  Clinical Narrative:                 Transition of Care Screening Note:  Transition of Care [facility]) Department has reviewed patient and no TOC needs have been identified at this time. We will continue to monitor patient advancement through Interdisciplinary progressions and if new patient needs arise, please place a consult.        Patient Goals and CMS Choice        Expected Discharge Plan and Services                                                Prior Living Arrangements/Services                       Activities of Daily Living Home Assistive Devices/Equipment: CPAP, Blood pressure cuff ADL Screening (condition at time of admission) Patient's cognitive ability adequate to safely complete daily activities?: Yes Is the patient deaf or have difficulty hearing?: No Does the patient have difficulty seeing, even when wearing glasses/contacts?: No Does the patient have difficulty concentrating, remembering, or making decisions?: No Patient able to express need for assistance with ADLs?: Yes Does the patient have difficulty dressing or bathing?: Yes Independently performs ADLs?: Yes (appropriate for developmental age) Does the patient have difficulty walking or climbing stairs?: Yes Weakness of Legs: None Weakness of Arms/Hands: None  Permission Sought/Granted                  Emotional Assessment              Admission diagnosis:  Incisional hernia [K43.2] Patient Active Problem List   Diagnosis Date Noted   Incisional hernia 02/22/2022   Leukocytosis 02/03/2021   BPH (benign prostatic hyperplasia) 08/20/2020   Class 2 obesity due to excess calories with body mass index (BMI) of 35.0 to 35.9 in  adult 08/20/2020   Acquired varus deformity of right ankle 01/07/2019   Osteoarthritis of right subtalar joint 01/07/2019   Post-traumatic arthritis of ankle, right 01/07/2019   Right ankle instability 01/07/2019   SBO (small bowel obstruction) (HCC) 09/10/2018   Abdominal pain, left lower quadrant 11/12/2014   Sleep apnea 04/15/2014   Snoring 04/15/2014   Insomnia 03/10/2013   Anxiety state 03/10/2013   ADD (attention deficit disorder) 09/11/2011   PCP:  Patient, No Pcp Per Pharmacy:   [id number] [place], [place] - [address] AT [facility] OF [address] [place]  [address] Phone: [phone] Fax: [phone]     Social Determinants of Health (SDOH) Interventions Housing Interventions: Intervention Not Indicated Transportation Interventions: Intervention Not Indicated Utilities Interventions: Intervention Not Indicated, Other (Comment)  Readmission Risk Interventions     No data to display

## 2022-02-22 NOTE — Anesthesia Procedure Notes (Signed)
Procedure Name: Intubation Date/Time: 02/22/2022 7:39 AM  Performed by: Heide Scales, CRNAPre-anesthesia Checklist: Patient identified, Emergency Drugs available, Suction available and Patient being monitored Patient Re-evaluated:Patient Re-evaluated prior to induction Oxygen Delivery Method: Circle system utilized Preoxygenation: Pre-oxygenation with 100% oxygen Induction Type: IV induction Ventilation: Mask ventilation without difficulty and Oral airway inserted - appropriate to patient size Laryngoscope Size: Mac and 4 Grade View: Grade I Tube type: Oral Tube size: 7.5 mm Number of attempts: 1 Airway Equipment and Method: Stylet and Oral airway Placement Confirmation: ETT inserted through vocal cords under direct vision, positive ETCO2 and breath sounds checked- equal and bilateral Secured at: 23 cm Tube secured with: Tape Dental Injury: Teeth and Oropharynx as per pre-operative assessment

## 2022-02-22 NOTE — Interval H&P Note (Signed)
History and Physical Interval Note:no change in H and P  02/22/2022 6:55 AM  Jose Shaffer  has presented today for surgery, with the diagnosis of RECURRENT UMBILICAL HERNIA.  The various methods of treatment have been discussed with the patient and family. After consideration of risks, benefits and other options for treatment, the patient has consented to  Procedure(s): OPEN REPAIR RECURRENT UMBILICAL HERNIA (N/A) as a surgical intervention.  The patient's history has been reviewed, patient examined, no change in status, stable for surgery.  I have reviewed the patient's chart and labs.  Questions were answered to the patient's satisfaction.     Abigail Miyamoto

## 2022-02-22 NOTE — Transfer of Care (Signed)
Immediate Anesthesia Transfer of Care Note  Patient: Jose Shaffer  Procedure(s) Performed: OPEN REPAIR RECURRENT UMBILICAL HERNIA (Abdomen)  Patient Location: PACU  Anesthesia Type:General  Level of Consciousness: drowsy  Airway & Oxygen Therapy: Patient Spontanous Breathing and Patient connected to face mask oxygen  Post-op Assessment: Report given to RN, Post -op Vital signs reviewed and stable, and Patient moving all extremities X 4  Post vital signs: Reviewed and stable  Last Vitals:  Vitals Value Taken Time  BP 139/83   Temp    Pulse 94   Resp 20   SpO2 99     Last Pain:  Vitals:   02/22/22 0602  TempSrc:   PainSc: 0-No pain         Complications: No notable events documented.

## 2022-02-22 NOTE — Progress Notes (Signed)
Patient admitted to room 2W03 from PACU.  Patient alert and oriented x4. Skin intact, with exception of surgical incision to lower mid abdomen which is clean, dry and intact.Lung sounds clear, bowel sounds normoactive Last bowel movement was this shift. States he had an incontinent episode during surgery but is typically continent x2 otherwise. Patient tolerated clear liquid diet well shortly upon his arrival to unit. Later in shift upgraded patient per order to full liquid diet. Patient takes meds whole with water, he is OOB as tol. He has NS at 50 mls/hour. He had complaints of pain 5/10 that he felt was increasing, gave 10 mg of Oxycodone which was effective. Wife at bedside for a few hours this shift.

## 2022-02-22 NOTE — Op Note (Addendum)
OPEN REPAIR RECURRENT UMBILICAL HERNIA  Procedure Note  Jose Shaffer 02/22/2022   Pre-op Diagnosis: RECURRENT UMBILICAL HERNIA     Post-op Diagnosis: same  Procedure(s): OPEN REPAIR RECURRENT UMBILICAL HERNIA WITH MESH (8 CM ROUND VENTRAL ST PATCH FROM BARD) 3 cm fascial defect  Surgeon(s): Abigail Miyamoto, MD Paulita Cradle, MD [facility] Resident  Anesthesia: General  Staff:  Circulator: Hermelinda Dellen, RN Scrub Person: Ivin Booty, RN; Coralee North T Circulator Assistant: Virgel Bouquet, RN  Estimated Blood Loss: Minimal               Findings: The patient was found to have a just slightly less than 3 cm fascial defect at the umbilicus.  It was repaired with an 8 cm round ventral ST Prolene coated patch from Bard  Procedure: The patient was brought to the operating identified as a correct patient.  He was placed upon the operating table and general anesthesia was induced.  His abdomen was prepped and draped in the usual sterile fashion.  I made a vertical incision around the umbilicus excising a previous scar that was just above the umbilicus with a scalpel.  This was carried down to the hernia sac which was these identified and excised.  The fascial defect itself was just less than 3 cm in size.  The hernia sac went to the left of the midline and was completely excised.  We had undermined the skin circumferentially around the fascia with cautery.  Next an 8 cm round ventral Prolene patch from Bard was brought onto the field.  We placed it through the fascial opening and then pulled it up against the peritoneum with the ties.  The mesh was then sutured in place circumferentially with #1 Novafil sutures.  We then cut the stay ties.  We were then able to close the fascia over the top of the mesh with figure-of-eight #1 Novafil sutures as well.  Wide coverage of fascial defect appeared to be achieved.  We anesthetized the surrounding skin and fascia with Marcaine.   Hemostasis appeared to be achieved.  The subcutaneous tissue was then closed with interrupted 3-0 Vicryl sutures and the skin was closed with a running 4-0 Monocryl.  Dermabond was then applied.  The patient tolerated the procedure well.  All the counts were correct at the end of the procedure.  The patient was then extubated in the operating room and taken in a stable condition to the recovery room.          Abigail Miyamoto   Date: 02/22/2022  Time: 8:33 AM

## 2022-02-23 ENCOUNTER — Encounter (HOSPITAL_COMMUNITY): Payer: Self-pay | Admitting: Surgery

## 2022-02-23 MED ORDER — OXYCODONE HCL 5 MG PO TABS: 5.0000 mg | ORAL_TABLET | Freq: Four times a day (QID) | ORAL | 0 refills | Status: DC | PRN

## 2022-02-23 NOTE — Discharge Instructions (Signed)
[facility] _____[facility]  UMBILICAL OR INGUINAL HERNIA REPAIR: POST OP INSTRUCTIONS  Always review your discharge instruction sheet given to you by the facility where your surgery was performed. IF YOU HAVE DISABILITY OR FAMILY LEAVE FORMS, YOU MUST BRING THEM TO THE OFFICE FOR PROCESSING.   DO NOT GIVE THEM TO YOUR DOCTOR.  1. A  prescription for pain medication may be given to you upon discharge.  Take your pain medication as prescribed, if needed.  If narcotic pain medicine is not needed, then you may take acetaminophen (Tylenol) or ibuprofen (Advil) as needed. 2. Take your usually prescribed medications unless otherwise directed. If you need a refill on your pain medication, please contact your pharmacy.  They will contact our office to request authorization. Prescriptions will not be filled after 5 pm or on week-ends. 3. You should follow a light diet the first 24 hours after arrival home, such as soup and crackers, etc.  Be sure to include lots of fluids daily.  Resume your normal diet the day after surgery. 4.Most patients will experience some swelling and bruising around the umbilicus or in the groin and scrotum.  Ice packs and reclining will help.  Swelling and bruising can take several days to resolve.  6. It is common to experience some constipation if taking pain medication after surgery.  Increasing fluid intake and taking a stool softener (such as Colace) will usually help or prevent this problem from occurring.  A mild laxative (Milk of Magnesia or Miralax) should be taken according to package directions if there are no bowel movements after 48 hours. 7. Unless discharge instructions indicate otherwise, you may remove your bandages 24-48 hours after surgery, and you may shower at that time.  You may have steri-strips (small skin tapes) in place directly over the incision.  These strips should be left on the skin for 7-10 days.  If your surgeon used skin glue on the  incision, you may shower in 24 hours.  The glue will flake off over the next 2-3 weeks.  Any sutures or staples will be removed at the office during your follow-up visit. 8. ACTIVITIES:  You may resume regular (light) daily activities beginning the next day--such as daily self-care, walking, climbing stairs--gradually increasing activities as tolerated.  You may have sexual intercourse when it is comfortable.  Refrain from any heavy lifting or straining until approved by your doctor.  a.You may drive when you are no longer taking prescription pain medication, you can comfortably wear a seatbelt, and you can safely maneuver your car and apply brakes. b.RETURN TO WORK:  ONE WEEK _____________________________________________  9.You should see your doctor in the office for a follow-up appointment approximately 2-3 weeks after your surgery.  Make sure that you call for this appointment within a day or two after you arrive home to insure a convenient appointment time. 10.OTHER INSTRUCTIONS: _NO LIFTING MORE THAN 15 POUNDS FOR 6 WEEKS TYLENOL, IBUPROFEN, ICE PACK ALSO FOR PAIN YOU MAY SHOWER STARTING TODAY ________________________    _____________________________________  WHEN TO CALL YOUR DOCTOR: Fever over 101.0 Inability to urinate Nausea and/or vomiting Extreme swelling or bruising Continued bleeding from incision. Increased pain, redness, or drainage from the incision  The clinic staff is available to answer your questions during regular business hours.  Please don't hesitate to call and ask to speak to one of the nurses for clinical concerns.  If you have a medical emergency, go to the nearest emergency room or call 911.  A surgeon from [facility] is always on call at the hospital   [address], [address], [place], [place]  [address] ?  [address] [address], [place], [place]   [address] [phone] ? [phone] ? FAX [phone] Web site: [email or web address] 

## 2022-02-23 NOTE — Anesthesia Postprocedure Evaluation (Signed)
Anesthesia Post Note  Patient: Jose Shaffer  Procedure(s) Performed: OPEN REPAIR RECURRENT UMBILICAL HERNIA (Abdomen)     Patient location during evaluation: PACU Anesthesia Type: General Level of consciousness: awake and alert Pain management: pain level controlled Vital Signs Assessment: post-procedure vital signs reviewed and stable Respiratory status: spontaneous breathing, nonlabored ventilation, respiratory function stable and patient connected to nasal cannula oxygen Cardiovascular status: blood pressure returned to baseline and stable Postop Assessment: no apparent nausea or vomiting Anesthetic complications: no   No notable events documented.  Last Vitals:  Vitals:   02/23/22 0445 02/23/22 0823  BP: (!) 141/85 (!) 141/96  Pulse: 79 79  Resp: 16 18  Temp: 36.7 C (!) 36.4 C  SpO2: 97% 100%    Last Pain:  Vitals:   02/23/22 0823  TempSrc: Oral  PainSc:                  Nelle Don Ree Alcalde

## 2022-02-23 NOTE — Discharge Summary (Signed)
Physician Discharge Summary  Patient ID: Jose Shaffer MRN: [id number] DOB/AGE: [birth year] 52 y.o.  Admit date: 02/22/2022 Discharge date: 02/23/2022  Admission Diagnoses:  Discharge Diagnoses:  Principal Problem:   Incisional hernia   Discharged Condition: good  Hospital Course: uneventful post op recovery.  Discharged home POD#1  Doing well  Consults: None  Significant Diagnostic Studies:   Treatments: surgery: open repair recurrent umbilical hernia with mesh  Discharge Exam: Blood pressure (!) 141/85, pulse 79, temperature 98 F (36.7 C), resp. rate 16, height 5\' 10"  (1.778 m), weight 108.9 kg, SpO2 97 %. General appearance: alert, cooperative, and no distress Resp: clear to auscultation bilaterally Cardio: regular rate and rhythm, S1, S2 normal, no murmur, click, rub or gallop Incision/Wound:incision clean  Disposition: Discharge disposition: 01-Home or Self Care        Allergies as of 02/23/2022       Reactions   Ibuprofen Itching, Rash   Per patient feels like KNOT in throat and itching with rash   Tramadol Anxiety   Tylenol [acetaminophen] Rash   Knot on throat        Medication List     TAKE these medications    BENEFIBER PO Take 5 mLs by mouth 2 (two) times daily. Mix in liquid and drink   fluticasone 50 MCG/ACT nasal spray Commonly known as: FLONASE Place 1 spray into both nostrils every evening.   methylphenidate 50 MG CR capsule Commonly known as: METADATE CD Take 50 mg by mouth every morning.   montelukast 10 MG tablet Commonly known as: SINGULAIR TAKE 1 TABLET(10 MG) BY MOUTH AT BEDTIME   naproxen sodium 220 MG tablet Commonly known as: ALEVE Take 440 mg by mouth 2 (two) times daily as needed (pain).   NON FORMULARY Pt uses a cpap nightly   oxyCODONE 5 MG immediate release tablet Commonly known as: Oxy IR/ROXICODONE Take 1 tablet (5 mg total) by mouth every 6 (six) hours as needed for moderate pain or severe  pain. What changed: how much to take   testosterone cypionate 200 MG/ML injection Commonly known as: DEPOTESTOSTERONE CYPIONATE Inject 200 mg into the muscle every 7 (seven) days.        Follow-up Information     02/25/2022, MD. Schedule an appointment as soon as possible for a visit on 03/17/2022.   Specialty: General Surgery Why: call the office and make an appointment to see me on 03/17/2022 Contact information: [address] [place] [phone]                 Signed: [phone] 02/23/2022, 7:55 AM

## 2022-02-24 ENCOUNTER — Emergency Department (HOSPITAL_BASED_OUTPATIENT_CLINIC_OR_DEPARTMENT_OTHER): Payer: BC Managed Care – PPO

## 2022-02-24 ENCOUNTER — Emergency Department (HOSPITAL_COMMUNITY): Payer: BC Managed Care – PPO | Admitting: Anesthesiology

## 2022-02-24 ENCOUNTER — Inpatient Hospital Stay (HOSPITAL_BASED_OUTPATIENT_CLINIC_OR_DEPARTMENT_OTHER)
Admission: EM | Admit: 2022-02-24 | Discharge: 2022-02-27 | DRG: 354 | Disposition: A | Discharge status: Home / Self Care | Payer: BC Managed Care – PPO | Attending: Surgery | Admitting: Surgery

## 2022-02-24 ENCOUNTER — Emergency Department (HOSPITAL_COMMUNITY): Payer: BC Managed Care – PPO

## 2022-02-24 ENCOUNTER — Other Ambulatory Visit: Payer: Self-pay

## 2022-02-24 ENCOUNTER — Encounter (HOSPITAL_BASED_OUTPATIENT_CLINIC_OR_DEPARTMENT_OTHER): Payer: Self-pay | Admitting: Emergency Medicine

## 2022-02-24 ENCOUNTER — Encounter (HOSPITAL_COMMUNITY)
Admission: EM | Disposition: A | Discharge status: Home / Self Care | Payer: Self-pay | Source: Home / Self Care | Attending: Surgery

## 2022-02-24 DIAGNOSIS — F988 Other specified behavioral and emotional disorders with onset usually occurring in childhood and adolescence: Secondary | ICD-10-CM | POA: Diagnosis present

## 2022-02-24 DIAGNOSIS — Z6835 Body mass index (BMI) 35.0-35.9, adult: Secondary | ICD-10-CM | POA: Diagnosis not present

## 2022-02-24 DIAGNOSIS — F411 Generalized anxiety disorder: Secondary | ICD-10-CM | POA: Diagnosis present

## 2022-02-24 DIAGNOSIS — M19071 Primary osteoarthritis, right ankle and foot: Secondary | ICD-10-CM | POA: Diagnosis present

## 2022-02-24 DIAGNOSIS — K432 Incisional hernia without obstruction or gangrene: Secondary | ICD-10-CM | POA: Diagnosis not present

## 2022-02-24 DIAGNOSIS — Z79899 Other long term (current) drug therapy: Secondary | ICD-10-CM | POA: Diagnosis not present

## 2022-02-24 DIAGNOSIS — E6609 Other obesity due to excess calories: Secondary | ICD-10-CM | POA: Diagnosis not present

## 2022-02-24 DIAGNOSIS — M25371 Other instability, right ankle: Secondary | ICD-10-CM | POA: Diagnosis not present

## 2022-02-24 DIAGNOSIS — Z825 Family history of asthma and other chronic lower respiratory diseases: Secondary | ICD-10-CM

## 2022-02-24 DIAGNOSIS — K429 Umbilical hernia without obstruction or gangrene: Secondary | ICD-10-CM | POA: Diagnosis present

## 2022-02-24 DIAGNOSIS — K56609 Unspecified intestinal obstruction, unspecified as to partial versus complete obstruction: Secondary | ICD-10-CM | POA: Diagnosis present

## 2022-02-24 DIAGNOSIS — G4700 Insomnia, unspecified: Secondary | ICD-10-CM | POA: Diagnosis not present

## 2022-02-24 DIAGNOSIS — M21171 Varus deformity, not elsewhere classified, right ankle: Secondary | ICD-10-CM | POA: Diagnosis present

## 2022-02-24 DIAGNOSIS — Z8249 Family history of ischemic heart disease and other diseases of the circulatory system: Secondary | ICD-10-CM | POA: Diagnosis not present

## 2022-02-24 DIAGNOSIS — R188 Other ascites: Secondary | ICD-10-CM | POA: Diagnosis not present

## 2022-02-24 DIAGNOSIS — G4730 Sleep apnea, unspecified: Secondary | ICD-10-CM | POA: Diagnosis not present

## 2022-02-24 DIAGNOSIS — G4733 Obstructive sleep apnea (adult) (pediatric): Secondary | ICD-10-CM | POA: Diagnosis present

## 2022-02-24 DIAGNOSIS — K565 Intestinal adhesions [bands], unspecified as to partial versus complete obstruction: Secondary | ICD-10-CM | POA: Clinically undetermined

## 2022-02-24 DIAGNOSIS — N400 Enlarged prostate without lower urinary tract symptoms: Secondary | ICD-10-CM | POA: Diagnosis not present

## 2022-02-24 HISTORY — PX: INCISIONAL HERNIA REPAIR: SHX193

## 2022-02-24 HISTORY — PX: INSERTION OF MESH: SHX5868

## 2022-02-24 HISTORY — PX: LAPAROTOMY: SHX154

## 2022-02-24 LAB — COMPREHENSIVE METABOLIC PANEL
ALT: 38 U/L (ref 0–44)
AST: 35 U/L (ref 15–41)
Albumin: 3.9 g/dL (ref 3.5–5.0)
Alkaline Phosphatase: 50 U/L (ref 38–126)
Anion gap: 11 (ref 5–15)
BUN: 18 mg/dL (ref 6–20)
CO2: 28 mmol/L (ref 22–32)
Calcium: 9.8 mg/dL (ref 8.9–10.3)
Chloride: 98 mmol/L (ref 98–111)
Creatinine, Ser: 1.3 mg/dL — ABNORMAL HIGH (ref 0.61–1.24)
GFR, Estimated: >60 mL/min (ref >60)
Glucose, Bld: 141 mg/dL — ABNORMAL HIGH (ref 70–99)
Potassium: 4 mmol/L (ref 3.5–5.1)
Sodium: 137 mmol/L (ref 135–145)
Total Bilirubin: 1.3 mg/dL — ABNORMAL HIGH (ref 0.3–1.2)
Total Protein: 8.2 g/dL — ABNORMAL HIGH (ref 6.5–8.1)

## 2022-02-24 LAB — CBC WITH DIFFERENTIAL/PLATELET
Abs Immature Granulocytes: 0.08 10*3/uL — ABNORMAL HIGH (ref 0.00–0.07)
Basophils Absolute: 0.1 10*3/uL (ref 0.0–0.1)
Basophils Relative: 0 %
Eosinophils Absolute: 0 10*3/uL (ref 0.0–0.5)
Eosinophils Relative: 0 %
HCT: 59.7 % — ABNORMAL HIGH (ref 39.0–52.0)
Hemoglobin: 19.8 g/dL — ABNORMAL HIGH (ref 13.0–17.0)
Immature Granulocytes: 0 %
Lymphocytes Relative: 7 %
Lymphs Abs: 1.4 10*3/uL (ref 0.7–4.0)
MCH: 28.7 pg (ref 26.0–34.0)
MCHC: 33.2 g/dL (ref 30.0–36.0)
MCV: 86.6 fL (ref 80.0–100.0)
Monocytes Absolute: 1.9 10*3/uL — ABNORMAL HIGH (ref 0.1–1.0)
Monocytes Relative: 10 %
Neutro Abs: 15.8 10*3/uL — ABNORMAL HIGH (ref 1.7–7.7)
Neutrophils Relative %: 83 %
Platelets: 357 10*3/uL (ref 150–400)
RBC: 6.89 MIL/uL — ABNORMAL HIGH (ref 4.22–5.81)
RDW: 14.2 % (ref 11.5–15.5)
WBC: 19.2 10*3/uL — ABNORMAL HIGH (ref 4.0–10.5)
nRBC: 0 % (ref 0.0–0.2)

## 2022-02-24 SURGERY — LAPAROTOMY, EXPLORATORY
Anesthesia: General | Site: Abdomen

## 2022-02-24 MED ORDER — ONDANSETRON HCL 4 MG/2ML IJ SOLN
4.0000 mg | Freq: Once | INTRAMUSCULAR | Status: DC
  Filled 2022-02-24: qty 2

## 2022-02-24 MED ORDER — PROPOFOL 10 MG/ML IV BOLUS
INTRAVENOUS | Status: AC
  Filled 2022-02-24: qty 20

## 2022-02-24 MED ORDER — MIDAZOLAM HCL 2 MG/2ML IJ SOLN
INTRAMUSCULAR | Status: AC
  Filled 2022-02-24: qty 2

## 2022-02-24 MED ORDER — FENTANYL CITRATE (PF) 250 MCG/5ML IJ SOLN
INTRAMUSCULAR | Status: AC
  Filled 2022-02-24: qty 5

## 2022-02-24 MED ORDER — SODIUM CHLORIDE 0.9 % IV BOLUS
1000.0000 mL | Freq: Once | INTRAVENOUS | Status: AC
  Administered 2022-02-24: 1000 mL via INTRAVENOUS

## 2022-02-24 MED ORDER — BUPIVACAINE HCL (PF) 0.25 % IJ SOLN
INTRAMUSCULAR | Status: DC | PRN
  Administered 2022-02-24: 20 mL

## 2022-02-24 MED ORDER — LIDOCAINE 2% (20 MG/ML) 5 ML SYRINGE
INTRAMUSCULAR | Status: DC | PRN
  Administered 2022-02-24: 100 mg via INTRAVENOUS

## 2022-02-24 MED ORDER — LIDOCAINE HCL URETHRAL/MUCOSAL 2 % EX GEL
1.0000 | Freq: Once | CUTANEOUS | Status: AC
  Administered 2022-02-24: 1
  Filled 2022-02-24: qty 11

## 2022-02-24 MED ORDER — ONDANSETRON HCL 4 MG/2ML IJ SOLN
INTRAMUSCULAR | Status: DC | PRN
  Administered 2022-02-24: 4 mg via INTRAVENOUS

## 2022-02-24 MED ORDER — MIDAZOLAM HCL 2 MG/2ML IJ SOLN
INTRAMUSCULAR | Status: DC | PRN
  Administered 2022-02-24: 2 mg via INTRAVENOUS

## 2022-02-24 MED ORDER — DEXAMETHASONE SODIUM PHOSPHATE 10 MG/ML IJ SOLN
INTRAMUSCULAR | Status: DC | PRN
  Administered 2022-02-24: 10 mg via INTRAVENOUS

## 2022-02-24 MED ORDER — FENTANYL CITRATE (PF) 250 MCG/5ML IJ SOLN
INTRAMUSCULAR | Status: DC | PRN
  Administered 2022-02-24: 100 ug via INTRAVENOUS
  Administered 2022-02-24: 50 ug via INTRAVENOUS

## 2022-02-24 MED ORDER — HYDROMORPHONE HCL 1 MG/ML IJ SOLN: 0.2500 mg | INTRAMUSCULAR | Status: DC | PRN

## 2022-02-24 MED ORDER — ORAL CARE MOUTH RINSE: 15.0000 mL | Freq: Once | OROMUCOSAL | Status: AC

## 2022-02-24 MED ORDER — CEFAZOLIN SODIUM-DEXTROSE 2-3 GM-%(50ML) IV SOLR
INTRAVENOUS | Status: DC | PRN
  Administered 2022-02-24: 2 g via INTRAVENOUS

## 2022-02-24 MED ORDER — SUGAMMADEX SODIUM 200 MG/2ML IV SOLN
INTRAVENOUS | Status: DC | PRN
  Administered 2022-02-24: 200 mg via INTRAVENOUS

## 2022-02-24 MED ORDER — IOHEXOL 350 MG/ML SOLN
75.0000 mL | Freq: Once | INTRAVENOUS | Status: AC | PRN
  Administered 2022-02-24: 75 mL via INTRAVENOUS

## 2022-02-24 MED ORDER — LIDOCAINE 2% (20 MG/ML) 5 ML SYRINGE
INTRAMUSCULAR | Status: AC
  Filled 2022-02-24: qty 5

## 2022-02-24 MED ORDER — ROCURONIUM BROMIDE 10 MG/ML (PF) SYRINGE
PREFILLED_SYRINGE | INTRAVENOUS | Status: DC | PRN
  Administered 2022-02-24: 50 mg via INTRAVENOUS

## 2022-02-24 MED ORDER — DEXAMETHASONE SODIUM PHOSPHATE 10 MG/ML IJ SOLN
INTRAMUSCULAR | Status: AC
  Filled 2022-02-24: qty 1

## 2022-02-24 MED ORDER — BUPIVACAINE HCL (PF) 0.25 % IJ SOLN
INTRAMUSCULAR | Status: AC
  Filled 2022-02-24: qty 30

## 2022-02-24 MED ORDER — CHLORHEXIDINE GLUCONATE 0.12 % MT SOLN: 15.0000 mL | Freq: Once | OROMUCOSAL | Status: AC

## 2022-02-24 MED ORDER — LACTATED RINGERS IV SOLN: INTRAVENOUS | Status: DC | PRN

## 2022-02-24 MED ORDER — ONDANSETRON HCL 4 MG/2ML IJ SOLN
INTRAMUSCULAR | Status: AC
  Filled 2022-02-24: qty 2

## 2022-02-24 MED ORDER — SUCCINYLCHOLINE CHLORIDE 200 MG/10ML IV SOSY
PREFILLED_SYRINGE | INTRAVENOUS | Status: AC
  Filled 2022-02-24: qty 10

## 2022-02-24 MED ORDER — CHLORHEXIDINE GLUCONATE 0.12 % MT SOLN
OROMUCOSAL | Status: AC
  Administered 2022-02-24: 15 mL via OROMUCOSAL
  Filled 2022-02-24: qty 15

## 2022-02-24 MED ORDER — METOCLOPRAMIDE HCL 5 MG/ML IJ SOLN
10.0000 mg | Freq: Once | INTRAMUSCULAR | Status: AC
  Administered 2022-02-24: 10 mg via INTRAVENOUS
  Filled 2022-02-24: qty 2

## 2022-02-24 MED ORDER — CHLORPROMAZINE HCL 25 MG/ML IJ SOLN
25.0000 mg | Freq: Once | INTRAMUSCULAR | Status: AC
  Administered 2022-02-24: 25 mg via INTRAMUSCULAR
  Filled 2022-02-24 (×2): qty 1

## 2022-02-24 MED ORDER — PROPOFOL 10 MG/ML IV BOLUS
INTRAVENOUS | Status: DC | PRN
  Administered 2022-02-24: 200 mg via INTRAVENOUS

## 2022-02-24 MED ORDER — HYDROMORPHONE 1 MG/ML IV SOLN
INTRAVENOUS | Status: DC
  Administered 2022-02-24 – 2022-02-25 (×3): 30 mg via INTRAVENOUS

## 2022-02-24 MED ORDER — LORAZEPAM 2 MG/ML IJ SOLN
1.0000 mg | Freq: Once | INTRAMUSCULAR | Status: AC
  Administered 2022-02-24: 1 mg via INTRAVENOUS
  Filled 2022-02-24: qty 1

## 2022-02-24 MED ORDER — CEFAZOLIN SODIUM-DEXTROSE 2-4 GM/100ML-% IV SOLN
INTRAVENOUS | Status: AC
  Filled 2022-02-24: qty 100

## 2022-02-24 MED ORDER — LACTATED RINGERS IV SOLN: INTRAVENOUS | Status: DC

## 2022-02-24 MED ORDER — ROCURONIUM BROMIDE 10 MG/ML (PF) SYRINGE
PREFILLED_SYRINGE | INTRAVENOUS | Status: AC
  Filled 2022-02-24: qty 10

## 2022-02-24 MED ORDER — PROMETHAZINE HCL 25 MG/ML IJ SOLN: 6.2500 mg | INTRAMUSCULAR | Status: DC | PRN

## 2022-02-24 MED ORDER — ONDANSETRON 4 MG PO TBDP
8.0000 mg | ORAL_TABLET | Freq: Once | ORAL | Status: AC
  Administered 2022-02-24: 8 mg via ORAL
  Filled 2022-02-24: qty 2

## 2022-02-24 MED ORDER — HYDROMORPHONE 1 MG/ML IV SOLN
INTRAVENOUS | Status: AC
  Administered 2022-02-24: 30 mg via INTRAVENOUS
  Filled 2022-02-24: qty 30

## 2022-02-24 MED ORDER — 0.9 % SODIUM CHLORIDE (POUR BTL) OPTIME
TOPICAL | Status: DC | PRN
  Administered 2022-02-24 (×2): 1000 mL

## 2022-02-24 MED ORDER — SUCCINYLCHOLINE CHLORIDE 200 MG/10ML IV SOSY
PREFILLED_SYRINGE | INTRAVENOUS | Status: DC | PRN
  Administered 2022-02-24: 140 mg via INTRAVENOUS

## 2022-02-24 SURGICAL SUPPLY — 47 items
BAG COUNTER SPONGE SURGICOUNT (BAG) ×1 IMPLANT
BINDER ABDOMINAL 12 ML 46-62 (SOFTGOODS) IMPLANT
BLADE CLIPPER SURG (BLADE) IMPLANT
CANISTER SUCT 3000ML PPV (MISCELLANEOUS) ×1 IMPLANT
COVER SURGICAL LIGHT HANDLE (MISCELLANEOUS) ×1 IMPLANT
DRAPE LAPAROSCOPIC ABDOMINAL (DRAPES) ×1 IMPLANT
DRAPE WARM FLUID 44X44 (DRAPES) ×1 IMPLANT
DRSG OPSITE POSTOP 4X10 (GAUZE/BANDAGES/DRESSINGS) IMPLANT
DRSG OPSITE POSTOP 4X6 (GAUZE/BANDAGES/DRESSINGS) IMPLANT
DRSG OPSITE POSTOP 4X8 (GAUZE/BANDAGES/DRESSINGS) IMPLANT
ELECT BLADE 6.5 EXT (BLADE) IMPLANT
ELECT CAUTERY BLADE 6.4 (BLADE) ×1 IMPLANT
ELECT REM PT RETURN 9FT ADLT (ELECTROSURGICAL) ×1
ELECTRODE REM PT RTRN 9FT ADLT (ELECTROSURGICAL) ×1 IMPLANT
GLOVE SURG SIGNA 7.5 PF LTX (GLOVE) ×1 IMPLANT
GOWN STRL REUS W/ TWL LRG LVL3 (GOWN DISPOSABLE) ×1 IMPLANT
GOWN STRL REUS W/ TWL XL LVL3 (GOWN DISPOSABLE) ×1 IMPLANT
GOWN STRL REUS W/TWL LRG LVL3 (GOWN DISPOSABLE) ×1
GOWN STRL REUS W/TWL XL LVL3 (GOWN DISPOSABLE) ×1
HANDLE SUCTION POOLE (INSTRUMENTS) ×1 IMPLANT
KIT BASIN OR (CUSTOM PROCEDURE TRAY) ×1 IMPLANT
KIT TURNOVER KIT B (KITS) ×1 IMPLANT
MESH VENTRALEX ST 8CM LRG (Mesh General) IMPLANT
NDL HYPO 25GX1X1/2 BEV (NEEDLE) IMPLANT
NEEDLE HYPO 25GX1X1/2 BEV (NEEDLE) ×1 IMPLANT
NS IRRIG 1000ML POUR BTL (IV SOLUTION) ×2 IMPLANT
PACK GENERAL/GYN (CUSTOM PROCEDURE TRAY) ×1 IMPLANT
PAD ARMBOARD 7.5X6 YLW CONV (MISCELLANEOUS) ×1 IMPLANT
SPECIMEN JAR LARGE (MISCELLANEOUS) IMPLANT
SPONGE T-LAP 18X18 ~~LOC~~+RFID (SPONGE) IMPLANT
STAPLER VISISTAT 35W (STAPLE) ×1 IMPLANT
SUCTION POOLE HANDLE (INSTRUMENTS) ×1
SUT NOVA 1 T20/GS 25DT (SUTURE) IMPLANT
SUT NOVA NAB DX-16 0-1 5-0 T12 (SUTURE) IMPLANT
SUT PDS AB 1 CT  36 (SUTURE) ×2
SUT PDS AB 1 CT 36 (SUTURE) IMPLANT
SUT PDS AB 1 TP1 96 (SUTURE) ×2 IMPLANT
SUT SILK 2 0 SH CR/8 (SUTURE) ×1 IMPLANT
SUT SILK 2 0 TIES 10X30 (SUTURE) ×1 IMPLANT
SUT SILK 3 0 SH CR/8 (SUTURE) ×1 IMPLANT
SUT SILK 3 0 TIES 10X30 (SUTURE) ×1 IMPLANT
SUT VIC AB 3-0 SH 27 (SUTURE) ×1
SUT VIC AB 3-0 SH 27X BRD (SUTURE) IMPLANT
SYR CONTROL 10ML LL (SYRINGE) IMPLANT
TOWEL GREEN STERILE (TOWEL DISPOSABLE) ×1 IMPLANT
TOWEL GREEN STERILE FF (TOWEL DISPOSABLE) ×1 IMPLANT
YANKAUER SUCT BULB TIP NO VENT (SUCTIONS) IMPLANT

## 2022-02-24 NOTE — Transfer of Care (Signed)
Immediate Anesthesia Transfer of Care Note  Patient: Jose Shaffer  Procedure(s) Performed: EXPLORATORY LAPAROTOMY (Abdomen) REPAIR OF RECURRENT INCISIONAL HERNIA (Abdomen) INSERTION OF MESH (Abdomen)  Patient Location: PACU  Anesthesia Type:General  Level of Consciousness: drowsy  Airway & Oxygen Therapy: Patient Spontanous Breathing and Patient connected to nasal cannula oxygen  Post-op Assessment: Report given to RN and Post -op Vital signs reviewed and stable  Post vital signs: Reviewed and stable  Last Vitals:  Vitals Value Taken Time  BP 153/95 02/24/22 1604  Temp    Pulse 101 02/24/22 1606  Resp 20 02/24/22 1606  SpO2 97 % 02/24/22 1606  Vitals shown include unvalidated device data.  Last Pain:  Vitals:   02/24/22 1427  TempSrc: Oral  PainSc: 4          Complications: No notable events documented.

## 2022-02-24 NOTE — ED Provider Notes (Signed)
[facility] EMERGENCY DEPARTMENT Provider Note   CSN: [id number] Arrival date & time: 02/24/22  0351     History  Chief Complaint  Patient presents with   Emesis    Jose Shaffer is a 51 y.o. male.  Patient is a 52 year old male with a past medical history of prior bilateral inguinal hernia repairs complicated by SBO with partial bowel resection and recent umbilical hernia repair on Tuesday presenting to the emergency department with nausea and vomiting.  Patient states that he was discharged from the hospital yesterday morning and was tolerating liquids.  He states however ever since being home he is had increasing nausea and started to vomit every time he would try to eat or drink anything.  He states that he has barely urinated in the last 24 hours.  He states that he has had no fevers.  He states that he is having diffuse abdominal pain.  He states he has had no bowel movements and is not passing gas.  He states that he has been continuously hiccuping.  The history is provided by the patient and the spouse.       Home Medications Prior to Admission medications   Medication Sig Start Date End Date Taking? Authorizing Provider  fluticasone (FLONASE) 50 MCG/ACT nasal spray Place 1 spray into both nostrils every evening. 05/27/20   Just, Azalee Course, FNP  methylphenidate (METADATE CD) 50 MG CR capsule Take 50 mg by mouth every morning. 04/21/21   [provider]  montelukast (SINGULAIR) 10 MG tablet TAKE 1 TABLET(10 MG) BY MOUTH AT BEDTIME Patient not taking: Reported on 02/11/2022 05/27/20   Just, Azalee Course, FNP  naproxen sodium (ALEVE) 220 MG tablet Take 440 mg by mouth 2 (two) times daily as needed (pain).    [provider]  NON FORMULARY Pt uses a cpap nightly    [provider]  oxyCODONE (OXY IR/ROXICODONE) 5 MG immediate release tablet Take 1 tablet (5 mg total) by mouth every 6 (six) hours as needed for moderate pain or severe pain. 02/23/22    Abigail Miyamoto, MD  testosterone cypionate (DEPOTESTOSTERONE CYPIONATE) 200 MG/ML injection Inject 200 mg into the muscle every 7 (seven) days.    [provider]  Wheat Dextrin (BENEFIBER PO) Take 5 mLs by mouth 2 (two) times daily. Mix in liquid and drink    [provider]      Allergies    Ibuprofen, Tramadol, and Tylenol [acetaminophen]    Review of Systems   Review of Systems  Physical Exam Updated Vital Signs BP (!) 157/103   Pulse 95   Temp 98.9 F (37.2 C) (Oral)   Resp 16   Ht 5\' 10"  (1.778 m)   Wt 108.9 kg   SpO2 92%   BMI 34.44 kg/m  Physical Exam Vitals and nursing note reviewed.  Constitutional:      General: He is not in acute distress.    Appearance: Normal appearance.  HENT:     Head: Normocephalic and atraumatic.     Nose: Nose normal.     Mouth/Throat:     Mouth: Mucous membranes are moist.     Pharynx: Oropharynx is clear.  Eyes:     Extraocular Movements: Extraocular movements intact.     Conjunctiva/sclera: Conjunctivae normal.  Cardiovascular:     Rate and Rhythm: Normal rate and regular rhythm.     Heart sounds: Normal heart sounds.  Pulmonary:     Effort: Pulmonary effort is normal.  Breath sounds: Normal breath sounds.  Abdominal:     General: Abdomen is flat.     Palpations: Abdomen is soft.     Tenderness: There is no abdominal tenderness.     Comments: Decreased bowel sounds diffusely  Musculoskeletal:        General: Normal range of motion.     Cervical back: Normal range of motion and neck supple.  Skin:    General: Skin is warm and dry.  Neurological:     General: No focal deficit present.     Mental Status: He is alert and oriented to person, place, and time.  Psychiatric:        Mood and Affect: Mood normal.        Behavior: Behavior normal.     ED Results / Procedures / Treatments   Labs (all labs ordered are listed, but only abnormal results are displayed) Labs Reviewed  CBC WITH  DIFFERENTIAL/PLATELET - Abnormal; Notable for the following components:      Result Value   WBC 19.2 (*)    RBC 6.89 (*)    Hemoglobin 19.8 (*)    HCT 59.7 (*)    Neutro Abs 15.8 (*)    Monocytes Absolute 1.9 (*)    Abs Immature Granulocytes 0.08 (*)    All other components within normal limits  COMPREHENSIVE METABOLIC PANEL - Abnormal; Notable for the following components:   Glucose, Bld 141 (*)    Creatinine, Ser 1.30 (*)    Total Protein 8.2 (*)    Total Bilirubin 1.3 (*)    All other components within normal limits    EKG EKG Interpretation  Date/Time:  Thursday February 24 2022 06:34:07 EST Ventricular Rate:  94 PR Interval:  180 QRS Duration: 101 QT Interval:  351 QTC Calculation: 439 R Axis:   119 Text Interpretation: Sinus rhythm Consider left atrial enlargement Right axis deviation Borderline T wave abnormalities No previous ECGs available Confirmed by Zadie Rhine ([id number]) on 02/24/2022 6:39:03 AM  Radiology DG Abdomen 1 View  Result Date: 02/24/2022 CLINICAL DATA:  Nasogastric tube placement. EXAM: ABDOMEN - 1 VIEW COMPARISON:  Same day. FINDINGS: Distal tip of nasogastric tube is seen in expected position of distal stomach. IMPRESSION: Distal tip of nasogastric tube is seen in expected position of distal stomach. Electronically Signed   By: Lupita Raider M.D.   On: 02/24/2022 10:58   DG Abdomen 1 View  Result Date: 02/24/2022 CLINICAL DATA:  Evaluate for small bowel obstruction. EXAM: ABDOMEN - 1 VIEW COMPARISON:  05/15/21 abdominal radiograph, CT AP 10/01/21 FINDINGS: There are multiple dilated loops of small bowel throughout the abdomen measuring up to 5 cm. No significant colonic gas is visualized. No pneumatosis. No pneumoperitoneum. No acute osseous abnormality. Pelvic phleboliths are visualized. IMPRESSION: Multiple dilated loops of small bowel throughout the abdomen measuring up to 5 cm, concerning for small bowel obstruction. Electronically Signed   By:  Lorenza Cambridge M.D.   On: 02/24/2022 08:52    Procedures Procedures    Medications Ordered in ED Medications  ondansetron (ZOFRAN) injection 4 mg (4 mg Intravenous Patient Refused/Not Given 02/24/22 1103)  ondansetron (ZOFRAN-ODT) disintegrating tablet 8 mg (8 mg Oral Given 02/24/22 0412)  sodium chloride 0.9 % bolus 1,000 mL (0 mLs Intravenous Stopped 02/24/22 0750)  sodium chloride 0.9 % bolus 1,000 mL (0 mLs Intravenous Stopped 02/24/22 1035)  metoCLOPramide (REGLAN) injection 10 mg (10 mg Intravenous Given 02/24/22 0750)  lidocaine (XYLOCAINE) 2 % jelly 1 Application (  1 Application Other Given 02/24/22 0959)  LORazepam (ATIVAN) injection 1 mg (1 mg Intravenous Given 02/24/22 3295)    ED Course/ Medical Decision Making/ A&P Clinical Course as of 02/24/22 1123  Thu Feb 24, 2022  0910 Abd x-ray concerning for SBO. CTAP ordered, but CT scanner currently down. General surgery will be consulted. [VK]  [id number] I spoke with general surgery Johnny Bridge PA who recommends admission to their service, likely to [facility]. She plans to reach out to Dr. [name] to determine location preference.  [VK]  1024 General surgery request ED to ED transfer to [facility] for CTAP to determine surgical disposition. [VK]    Clinical Course User Index [VK] Rexford Maus, DO                           Medical Decision Making This patient presents to the ED with chief complaint(s) of abdominal pain, nausea vomiting with pertinent past medical history of umbilical hernia repair on Tuesday, prior bilateral inguinal hernia repairs complicated by SBO which further complicates the presenting complaint. The complaint involves an extensive differential diagnosis and also carries with it a high risk of complications and morbidity.    The differential diagnosis includes SBO, ileus, dehydration, electrolyte abnormality, postop infection/abscess, viral syndrome  Additional history obtained: Additional history obtained from  spouse Records reviewed previous admission documents  ED Course and Reassessment: Patient was initially evaluated by triage and had labs performed and was started on nausea medication and fluids.  The patient states that his nausea has improved and he does appear dehydrated with hemoconcentrated CBC and a mild increase of his creatinine of 1.3.  He will continue to be fluid resuscitated and will be given additional Reglan for his nausea and hiccups.  Patient does have decreased bowel sounds on exam concerning for SBO and CT scan will be performed.  I will plan to reach out to his surgeon once his CT has been completed.  Independent labs interpretation:  The following labs were independently interpreted: leukocytosis, mild Cr elevation  Independent visualization of imaging: - I independently visualized the following imaging with scope of interpretation limited to determining acute life threatening conditions related to emergency care: Abd XR, which revealed bowel dilation concerning for SBO  Consultation: - Consulted or discussed management/test interpretation w/ external professional: general surgery  Consideration for admission or further workup: Patient requires admission for further management of his SBO Social Determinants of health: N/A    Amount and/or Complexity of Data Reviewed Radiology: ordered.  Risk Prescription drug management. Decision regarding hospitalization.          Final Clinical Impression(s) / ED Diagnoses Final diagnoses:  SBO (small bowel obstruction) (HCC)    Rx / DC Orders ED Discharge Orders     None         Rexford Maus, DO 02/24/22 1123

## 2022-02-24 NOTE — ED Notes (Signed)
MD at bedside. 

## 2022-02-24 NOTE — Anesthesia Postprocedure Evaluation (Signed)
Anesthesia Post Note  Patient: Jose Shaffer  Procedure(s) Performed: EXPLORATORY LAPAROTOMY (Abdomen) REPAIR OF RECURRENT INCISIONAL HERNIA (Abdomen) INSERTION OF MESH (Abdomen)     Patient location during evaluation: PACU Anesthesia Type: General Level of consciousness: awake Pain management: pain level controlled Vital Signs Assessment: post-procedure vital signs reviewed and stable Respiratory status: spontaneous breathing, nonlabored ventilation and respiratory function stable Cardiovascular status: blood pressure returned to baseline and stable Postop Assessment: no apparent nausea or vomiting Anesthetic complications: no   No notable events documented.  Last Vitals:  Vitals:   02/24/22 1630 02/24/22 1645  BP: (!) 154/98 (!) 138/101  Pulse: 100 95  Resp: 18 12  Temp:    SpO2: 94% 93%    Last Pain:  Vitals:   02/24/22 1630  TempSrc:   PainSc: 0-No pain                 Linton Rump

## 2022-02-24 NOTE — H&P (Signed)
Jose Shaffer is an 51 y.o. male.   Chief Complaint: Postop nausea and vomiting HPI: This is a 51 year old gentleman who is 2 days status post open repair of recurrent umbilical hernia with mesh.  He developed nausea and vomiting late yesterday.  He presented emergency department where he was found to have a loop of small bowel coming up to the hernia repair on CT scan obstructing the bowel.  He is otherwise without complaints  Past Medical History:  Diagnosis Date   ADD (attention deficit disorder)    Allergy    Anxiety state, unspecified 03/10/2013   Arthritis    right ankle   Diverticulitis    Insomnia    Long-term current use of testosterone replacement therapy    Sleep apnea     Past Surgical History:  Procedure Laterality Date   BOWEL RESECTION N/A 06/03/2021   Procedure: SMALL BOWEL RESECTION;  Surgeon: [name], MD;  Location: [place];  Service: General;  Laterality: N/A;   HERNIA REPAIR N/A    Phreesia 05/24/2020   INGUINAL HERNIA REPAIR Bilateral 01/22/2015   Procedure: LAPAROSCOPIC BILATERAL INGUINAL HERNIA REPAIR WITH MESH AND UMBILICAL HERNIA REPAIR;  Surgeon: [name], MD;  Location: [place];  Service: General;  Laterality: Bilateral;   LAPAROSCOPY N/A 06/03/2021   Procedure: Diagnostic laparoscopy;  Surgeon: [name], MD;  Location: [place];  Service: General;  Laterality: N/A;   LAPAROTOMY N/A 06/03/2021   Procedure: EXPLORATORY LAPAROTOMY;  Surgeon: [name], MD;  Location: [facility] OR;  Service: General;  Laterality: N/A;   QUADRICEPS TENDON REPAIR  0000000   UMBILICAL HERNIA REPAIR N/A 01/22/2015   Procedure: UMBILICAL HERNIA REPAIR;  Surgeon: [name], MD;  Location: [place];  Service: General;  Laterality: N/A;   UMBILICAL HERNIA REPAIR N/A 02/22/2022   Procedure: OPEN REPAIR RECURRENT UMBILICAL HERNIA;  Surgeon: [name], MD;  Location: [place];  Service: General;  Laterality: N/A;     Family History  Problem Relation Age of Onset   Hypertension Father    Colon polyps Father    Cancer Maternal Grandmother 58       breast cancer   Hypertension Paternal Grandmother    COPD Paternal Grandfather        lung cancer; tobacco use, steel mill worker   Cancer Maternal Aunt        breast cancer, late 50's   Breast cancer Maternal Aunt    Cancer Maternal Aunt        breast cancer; diagnosed late 50's   Breast cancer Maternal Aunt    Colon cancer Neg Hx    Rectal cancer Neg Hx    Stomach cancer Neg Hx    Esophageal cancer Neg Hx    Social History:  reports that he has never smoked. He has never used smokeless tobacco. He reports that he does not currently use alcohol. He reports current drug use. Drug: Marijuana.  Allergies:  Allergies  Allergen Reactions   Ibuprofen Itching and Rash    Per patient feels like KNOT in throat and itching with rash   Tramadol Anxiety   Tylenol [Acetaminophen] Rash    Knot on throat    Medications Prior to Admission  Medication Sig Dispense Refill   fluticasone (FLONASE) 50 MCG/ACT nasal spray Place 1 spray into both nostrils every evening. 11.1 mL 3   methylphenidate (METADATE CD) 50 MG CR capsule Take 50 mg by mouth every morning.     NON FORMULARY Pt  uses a cpap nightly     Wheat Dextrin (BENEFIBER PO) Take 5 mLs by mouth 2 (two) times daily. Mix in liquid and drink     montelukast (SINGULAIR) 10 MG tablet TAKE 1 TABLET(10 MG) BY MOUTH AT BEDTIME (Patient not taking: Reported on 02/11/2022) 90 tablet 3   oxyCODONE (OXY IR/ROXICODONE) 5 MG immediate release tablet Take 1 tablet (5 mg total) by mouth every 6 (six) hours as needed for moderate pain or severe pain. (Patient not taking: Reported on 02/24/2022) 20 tablet 0    Results for orders placed or performed during the hospital encounter of 02/24/22 (from the past 48 hour(s))  CBC with Differential     Status: Abnormal   Collection Time: 02/24/22  6:16 AM  Result Value Ref  Range   WBC 19.2 (H) 4.0 - 10.5 K/uL   RBC 6.89 (H) 4.22 - 5.81 MIL/uL   Hemoglobin 19.8 (H) 13.0 - 17.0 g/dL   HCT 59.7 (H) 39.0 - 52.0 %   MCV 86.6 80.0 - 100.0 fL   MCH 28.7 26.0 - 34.0 pg   MCHC 33.2 30.0 - 36.0 g/dL   RDW 14.2 11.5 - 15.5 %   Platelets 357 150 - 400 K/uL   nRBC 0.0 0.0 - 0.2 %   Neutrophils Relative % 83 %   Neutro Abs 15.8 (H) 1.7 - 7.7 K/uL   Lymphocytes Relative 7 %   Lymphs Abs 1.4 0.7 - 4.0 K/uL   Monocytes Relative 10 %   Monocytes Absolute 1.9 (H) 0.1 - 1.0 K/uL   Eosinophils Relative 0 %   Eosinophils Absolute 0.0 0.0 - 0.5 K/uL   Basophils Relative 0 %   Basophils Absolute 0.1 0.0 - 0.1 K/uL   Immature Granulocytes 0 %   Abs Immature Granulocytes 0.08 (H) 0.00 - 0.07 K/uL    Comment: Performed at [facility], [address] [place], [place] [address]  Comprehensive metabolic panel     Status: Abnormal   Collection Time: 02/24/22  6:16 AM  Result Value Ref Range   Sodium 137 135 - 145 mmol/L   Potassium 4.0 3.5 - 5.1 mmol/L   Chloride 98 98 - 111 mmol/L   CO2 28 22 - 32 mmol/L   Glucose, Bld 141 (H) 70 - 99 mg/dL    Comment: Glucose reference range applies only to samples taken after fasting for at least 8 hours.   BUN 18 6 - 20 mg/dL   Creatinine, Ser 1.30 (H) 0.61 - 1.24 mg/dL   Calcium 9.8 8.9 - 10.3 mg/dL   Total Protein 8.2 (H) 6.5 - 8.1 g/dL   Albumin 3.9 3.5 - 5.0 g/dL   AST 35 15 - 41 U/L   ALT 38 0 - 44 U/L   Alkaline Phosphatase 50 38 - 126 U/L   Total Bilirubin 1.3 (H) 0.3 - 1.2 mg/dL   GFR, Estimated >60 >60 mL/min    Comment: (NOTE) Calculated using the CKD-EPI Creatinine Equation (2021)    Anion gap 11 5 - 15    Comment: Performed at [facility], [address] [place], [place] [address]   CT Abdomen Pelvis W Contrast  Result Date: 02/24/2022 CLINICAL DATA:  Bowel obstruction suspected. Nausea and vomiting. Recent umbilical hernia repair. EXAM: CT ABDOMEN AND PELVIS WITH CONTRAST TECHNIQUE:  Multidetector CT imaging of the abdomen and pelvis was performed using the standard protocol following bolus administration of intravenous contrast. RADIATION DOSE REDUCTION: This exam was  performed according to the departmental dose-optimization program which includes automated exposure control, adjustment of the mA and/or kV according to patient size and/or use of iterative reconstruction technique. CONTRAST:  55mL OMNIPAQUE IOHEXOL 350 MG/ML SOLN COMPARISON:  CT abdomen and pelvis 10/01/2021 FINDINGS: Lower chest: Dependent atelectasis in the lower lobes. No pleural effusion. Hepatobiliary: Hepatic steatosis. Unremarkable gallbladder. No biliary dilatation. Pancreas: Unremarkable. Spleen: Unremarkable. Adrenals/Urinary Tract: Unremarkable adrenal glands. No suspicious renal mass, calculi, or hydronephrosis. Unremarkable bladder. Stomach/Bowel: An enteric tube terminates in the proximal duodenum. There is left-sided colonic diverticulosis without evidence of acute diverticulitis. Sequelae of interval umbilical hernia repair are identified with mild regional subcutaneous fat stranding and gas. There is no fluid collection. A recurrent umbilical hernia is present containing a loop of small bowel. The small bowel proximal to this is dilated up to 4.5 cm in diameter with numerous air-fluid levels. The more distal small bowel is decompressed. A small bowel anastomosis is noted in the right upper quadrant. The appendix is unremarkable. Vascular/Lymphatic: Normal caliber of the abdominal aorta. No enlarged lymph nodes. Reproductive: Mildly enlarged prostate. Other: No significant ascites or pneumoperitoneum. Unchanged fat in both inguinal canals. Musculoskeletal: No acute osseous abnormality or suspicious osseous lesion. Unchanged prominent Schmorl's node involving the L4 superior endplate. IMPRESSION: 1. 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 Hepatic steatosis. 2.  Colonic diverticulosis. Electronically Signed   By: Sebastian Ache M.D.   On: 02/24/2022 13:25   DG Abdomen 1 View  Result Date: 02/24/2022 CLINICAL DATA:  Nasogastric tube placement. EXAM: ABDOMEN - 1 VIEW COMPARISON:  Same day. FINDINGS: Distal tip of nasogastric tube is seen in expected position of distal stomach. IMPRESSION: Distal tip of nasogastric tube is seen in expected position of distal stomach. Electronically Signed   By: Lupita Raider M.D.   On: 02/24/2022 10:58   DG Abdomen 1 View  Result Date: 02/24/2022 CLINICAL DATA:  Evaluate for small bowel obstruction. EXAM: ABDOMEN - 1 VIEW COMPARISON:  05/15/21 abdominal radiograph, CT AP 10/01/21 FINDINGS: There are multiple dilated loops of small bowel throughout the abdomen measuring up to 5 cm. No significant colonic gas is visualized. No pneumatosis. No pneumoperitoneum. No acute osseous abnormality. Pelvic phleboliths are visualized. IMPRESSION: Multiple dilated loops of small bowel throughout the abdomen measuring up to 5 cm, concerning for small bowel obstruction. Electronically Signed   By: Lorenza Cambridge M.D.   On: 02/24/2022 08:52    Review of Systems  All other systems reviewed and are negative.   Blood pressure (!) 154/92, pulse 100, temperature 98.5 F (36.9 C), temperature source Oral, resp. rate 18, height 5\' 10"  (1.778 m), weight 108.9 kg, SpO2 93 %. Physical Exam   He is uncomfortable feeling on exam and has a nasogastric tube in place  Abdomen is soft.  Incision is healing well.  There is slight fluctuance to the left edge of the incision consistent with incarcerated male  Assessment/Plan Recurrent hernia with incarcerated bowel status post recent repair  I suspect a suture may have pulled apart pulling the mesh down and recreating the hernia.  Nonetheless, emergent repair is recommended given CT findings.  I discussed this with the patient and his wife.  We discussed the risk which includes but is not limited to  bleeding, infection, injury to surrounding structures, the need for a bowel resection, the need for further mesh, cardiopulmonary issues, postoperative recovery, etc.  They understand agreed to proceed.  Surgery scheduled urgently  [name], MD 02/24/2022, 2:33 PM

## 2022-02-24 NOTE — Anesthesia Procedure Notes (Addendum)
Procedure Name: Intubation Date/Time: 02/24/2022 3:08 PM  Performed by: Erick Colace, CRNAPre-anesthesia Checklist: Patient identified, Emergency Drugs available, Suction available and Patient being monitored Patient Re-evaluated:Patient Re-evaluated prior to induction Oxygen Delivery Method: Circle system utilized Preoxygenation: Pre-oxygenation with 100% oxygen Induction Type: IV induction, Cricoid Pressure applied and Rapid sequence Laryngoscope Size: Mac and 4 Grade View: Grade I Tube type: Oral Tube size: 7.5 mm Number of attempts: 1 Airway Equipment and Method: Stylet Placement Confirmation: ETT inserted through vocal cords under direct vision, positive ETCO2 and breath sounds checked- equal and bilateral Secured at: 23 cm Tube secured with: Tape Dental Injury: Teeth and Oropharynx as per pre-operative assessment

## 2022-02-24 NOTE — Op Note (Signed)
Jose Shaffer 02/24/2022   Pre-op Diagnosis: small bowel obstruction     Post-op Diagnosis: small bowel obstruction secondary to recurrent umbilical hernia  Procedure(s): EXPLORATORY LAPAROTOMY REPAIR OF RECURRENT UMBILICAL HERNIA INSERTION OF MESH  Surgeon(s): Abigail Miyamoto, MD Paulita Cradle, MD [facility] Resident  Anesthesia: General  Staff:  Circulator: Julieta Bellini, RN Relief Circulator: Josefa Half, RN Scrub Person: Francesco Sor, CST  Estimated Blood Loss: Minimal               Indications: This is a 51 year old gentleman who is 2 days status post a recurrent umbilical hernia repair with mesh.  He developed nausea and vomiting last evening.  He presented to the ER today and a CT scan showed a recurrent hernia containing small bowel which was obstructed.  The decision was made to proceed to the operating room for repair  Findings: It appeared that 1 or 2 of the sutures holding the mesh in place came apart and then the herniated small bowel through this area which then pulled the overlying fascia apart at 1 suture.  I elected to remove the mesh and placed a new piece of mesh..  There was no evidence of compromise of the small bowel  Procedure: The patient was brought to the operating identified as a correct patient.  He was placed upon the operating table and general anesthesia was induced.  His abdomen was then prepped and draped in usual sterile fashion.  I opened up his previous incision with a scalpel.  I immediately identified small bowel herniating through the lateral edge superiorly at the hernia.  The bowel was pink with only 1 mild serosal tear which I repaired with 3-0 silk sutures.  There was no full-thickness injury or evidence of ischemia.  I then evaluated the area of the hernia and it appeared that at least 1 or 2 of the sutures holding the mesh in place were disrupted was then created an opening in the fascia that the bowel herniated through.   I elected to remove all sutures and remove the piece of mesh that had been placed.  His fascia was still strong and intact.  I again evaluated the small bowel segment and was saw no evidence of ischemia of the bowel.  There was some mild amount of reactive ascites.  Necessarily at this point the fascial defect was still 3 cm.  I brought another 8 cm round ventral ST Prolene patch from bard onto the field.  I placed it through the fascial opening and then again pulled rubbing as the peritoneum with the ties.  I again sutured the mesh in circumferentially with #1 simple sutures.  I evaluated circumferentially and it appeared to be intact.  I then closed the fascia over the top of the mesh with a running #1 PDS sutures from each end of the incision meeting in the middle.  Again, the fascia easily reapproximated and did not appear to be under tension.  We irrigated the wound with saline.  I anesthetized the fascia again with Marcaine.  We then closed the subcutaneous tissue with interrupted 3-0 Vicryl sutures and closed the skin with skin staples.  Honeycomb dressing and an abdominal wall binder were applied.  The patient tolerated the procedure well.  All the counts were correct at the end of the procedure.  He was then extubated in the operating room and taken in stable condition to the recovery room.          Jose Shaffer  Jose Shaffer   Date: 02/24/2022  Time: 3:58 PM

## 2022-02-24 NOTE — Progress Notes (Addendum)
   02/24/22 2125  Provider Notification  Provider Name/Title Kinsinger, MD  Date Provider Notified 02/24/22  Time Provider Notified 2125  Method of Notification Page  Notification Reason Other (Comment)  Provider response Other (Comment) (Keep sensor on per policy. If patient uncooperative, will d/c Dilaudid PCA pump.)  Date of Provider Response 02/24/22  Time of Provider Response 2128   Patient is very upset, irritable, and confrontational about the O2 sensor beeping when he falls asleep. Explained to patient that it's a safety feature for the Alaris PCA pump, that it beeps to let staff know his O2 and RR is dropping if/when he falls asleep. Verbalized understanding of safety feature of Alaris pump and [facility] policy.  However, he is adamant about not turning off the O2 sensor. States he just want to be able to sleep for once. Provider on call notified - and stated keep O2 sensor on, otherwise PCA pump need to be d/c. Also, requested IV team to place new PIV since current IV is on his [facility] which contributes to a lot of beeping when he bends or move his arm.

## 2022-02-24 NOTE — ED Provider Notes (Signed)
12:10 PM Assumed care of patient from off-going team. For more details, please see note from same day.  In brief, this is a 51 y.o. male ED to ED transfer from [facility] for CTAP and surgical consult. CT scanner down at [facility].   Plan/Dispo at time of sign-out & ED Course since sign-out: [ ]  CT A/P w/ contrast [ ]  likely admission to surgery service  BP (!) 157/103   Pulse 95   Temp 98.9 F (37.2 C) (Oral)   Resp 16   Ht 5\' 10"  (1.778 m)   Wt 108.9 kg   SpO2 92%   BMI 34.44 kg/m    ED Course:   Clinical Course as of 02/24/22 1346  Thu Feb 24, 2022  0910 Abd x-ray concerning for SBO. CTAP ordered, but CT scanner currently down. General surgery will be consulted. [VK]  I spoke with general surgery 02/26/22 PA who recommends admission to their service, likely to Feb 26, 2022. She plans to reach out to Dr. [phone] to determine location preference.  [VK]  1024 General surgery request ED to ED transfer to [facility] for CTAP to determine surgical disposition. [VK]  1332 CT Abdomen Pelvis W Contrast 1. 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 Hepatic steatosis. 2. Colonic diverticulosis.   [HN]  1342 Paged gen surg [HN]  1344 Dr[facility] planning to admit patient, patient posted to OR [HN]    Clinical Course User Index [HN] [name], MD [VK] [facility], DO   ------------------------------- [name], MD Emergency Medicine  This note was created using dictation software, which may contain spelling or grammatical errors.   Loetta Rough, MD 02/24/22 1346

## 2022-02-24 NOTE — Progress Notes (Signed)
Pt roomed to unit at 1835hrs after having a hernia repair. Alert, oriented and able to voice needs. PCA pump in use, no concerns voiced at this time

## 2022-02-24 NOTE — ED Triage Notes (Signed)
Pt states he had a hernia repair on Tuesday morning was discharged from the hospital on Wednesday morning and has not been able to keep anything down since then

## 2022-02-24 NOTE — Anesthesia Preprocedure Evaluation (Addendum)
Anesthesia Evaluation  Patient identified by MRN, date of birth, ID band Patient awake    Reviewed: Allergy & Precautions, NPO status , Patient's Chart, lab work & pertinent test results  History of Anesthesia Complications Negative for: history of anesthetic complications  Airway Mallampati: III  TM Distance: >3 FB Neck ROM: Full    Dental  (+) Teeth Intact, Dental Advisory Given   Pulmonary sleep apnea    breath sounds clear to auscultation       Cardiovascular negative cardio ROS  Rhythm:Regular Rate:Normal     Neuro/Psych  PSYCHIATRIC DISORDERS (ADD) Anxiety     negative neurological ROS     GI/Hepatic negative GI ROS, Neg liver ROS,,,  Endo/Other  negative endocrine ROS    Renal/GU negative Renal ROS     Musculoskeletal  (+) Arthritis , Osteoarthritis,    Abdominal   Peds  Hematology negative hematology ROS (+)   Anesthesia Other Findings 51 year old male with a past medical history of prior bilateral inguinal hernia repairs complicated by SBO with partial bowel resection and recent umbilical hernia repair on Tuesday presenting to the emergency department with nausea and vomiting.  Reproductive/Obstetrics                             Anesthesia Physical Anesthesia Plan  ASA: 2 and emergent  Anesthesia Plan: General   Post-op Pain Management:    Induction: Intravenous and Rapid sequence  PONV Risk Score and Plan: 2 and Ondansetron and Dexamethasone  Airway Management Planned: Oral ETT  Additional Equipment:   Intra-op Plan:   Post-operative Plan: Extubation in OR  Informed Consent: I have reviewed the patients History and Physical, chart, labs and discussed the procedure including the risks, benefits and alternatives for the proposed anesthesia with the patient or authorized representative who has indicated his/her understanding and acceptance.     Dental advisory  given  Plan Discussed with: CRNA and Anesthesiologist  Anesthesia Plan Comments: (Risks of general anesthesia discussed including, but not limited to, sore throat, hoarse voice, chipped/damaged teeth, injury to vocal cords, nausea and vomiting, allergic reactions, lung infection, heart attack, stroke, and death. All questions answered. )        Anesthesia Quick Evaluation

## 2022-02-25 ENCOUNTER — Encounter (HOSPITAL_COMMUNITY): Payer: Self-pay | Admitting: Surgery

## 2022-02-25 LAB — CBC
HCT: 52.4 % — ABNORMAL HIGH (ref 39.0–52.0)
Hemoglobin: 17.1 g/dL — ABNORMAL HIGH (ref 13.0–17.0)
MCH: 29 pg (ref 26.0–34.0)
MCHC: 32.6 g/dL (ref 30.0–36.0)
MCV: 89 fL (ref 80.0–100.0)
Platelets: 285 10*3/uL (ref 150–400)
RBC: 5.89 MIL/uL — ABNORMAL HIGH (ref 4.22–5.81)
RDW: 13.9 % (ref 11.5–15.5)
WBC: 15.5 10*3/uL — ABNORMAL HIGH (ref 4.0–10.5)
nRBC: 0 % (ref 0.0–0.2)

## 2022-02-25 LAB — BASIC METABOLIC PANEL
Anion gap: 11 (ref 5–15)
BUN: 15 mg/dL (ref 6–20)
CO2: 24 mmol/L (ref 22–32)
Calcium: 8.7 mg/dL — ABNORMAL LOW (ref 8.9–10.3)
Chloride: 102 mmol/L (ref 98–111)
Creatinine, Ser: 1.19 mg/dL (ref 0.61–1.24)
GFR, Estimated: >60 mL/min (ref >60)
Glucose, Bld: 127 mg/dL — ABNORMAL HIGH (ref 70–99)
Potassium: 4.6 mmol/L (ref 3.5–5.1)
Sodium: 137 mmol/L (ref 135–145)

## 2022-02-25 MED ORDER — MELATONIN 3 MG PO TABS
3.0000 mg | ORAL_TABLET | Freq: Every day | ORAL | Status: DC
  Administered 2022-02-25 – 2022-02-26 (×2): 3 mg via ORAL
  Filled 2022-02-25 (×2): qty 1

## 2022-02-25 MED ORDER — SODIUM CHLORIDE 0.9% FLUSH: 9.0000 mL | INTRAVENOUS | Status: DC | PRN

## 2022-02-25 MED ORDER — DIPHENHYDRAMINE HCL 50 MG/ML IJ SOLN: 25.0000 mg | Freq: Four times a day (QID) | INTRAMUSCULAR | Status: DC | PRN

## 2022-02-25 MED ORDER — NALOXONE HCL 0.4 MG/ML IJ SOLN: 0.4000 mg | INTRAMUSCULAR | Status: DC | PRN

## 2022-02-25 MED ORDER — HYDROMORPHONE HCL 1 MG/ML IJ SOLN
0.5000 mg | Freq: Once | INTRAMUSCULAR | Status: AC
  Administered 2022-02-25: 0.5 mg via INTRAVENOUS
  Filled 2022-02-25: qty 0.5

## 2022-02-25 MED ORDER — DIPHENHYDRAMINE HCL 12.5 MG/5ML PO ELIX: 12.5000 mg | ORAL_SOLUTION | Freq: Four times a day (QID) | ORAL | Status: DC | PRN

## 2022-02-25 MED ORDER — POTASSIUM CHLORIDE IN NACL 20-0.9 MEQ/L-% IV SOLN
INTRAVENOUS | Status: DC
  Filled 2022-02-25 (×4): qty 1000

## 2022-02-25 MED ORDER — ONDANSETRON HCL 4 MG/2ML IJ SOLN
4.0000 mg | Freq: Four times a day (QID) | INTRAMUSCULAR | Status: DC | PRN
  Administered 2022-02-26: 4 mg via INTRAVENOUS
  Filled 2022-02-25: qty 2

## 2022-02-25 MED ORDER — HYDROMORPHONE HCL 1 MG/ML IJ SOLN
1.0000 mg | Freq: Once | INTRAMUSCULAR | Status: AC
  Administered 2022-02-25: 1 mg via INTRAVENOUS

## 2022-02-25 MED ORDER — DIPHENHYDRAMINE HCL 25 MG PO CAPS
25.0000 mg | ORAL_CAPSULE | Freq: Four times a day (QID) | ORAL | Status: DC | PRN
  Administered 2022-02-25 – 2022-02-26 (×2): 25 mg via ORAL
  Filled 2022-02-25 (×2): qty 1

## 2022-02-25 MED ORDER — ENOXAPARIN SODIUM 40 MG/0.4ML IJ SOSY
40.0000 mg | PREFILLED_SYRINGE | INTRAMUSCULAR | Status: DC
  Administered 2022-02-25 – 2022-02-26 (×2): 40 mg via SUBCUTANEOUS
  Filled 2022-02-25 (×2): qty 0.4

## 2022-02-25 MED ORDER — ONDANSETRON 4 MG PO TBDP: 4.0000 mg | ORAL_TABLET | Freq: Four times a day (QID) | ORAL | Status: DC | PRN

## 2022-02-25 MED ORDER — HYDROMORPHONE HCL 1 MG/ML IJ SOLN
1.0000 mg | INTRAMUSCULAR | Status: DC | PRN
  Administered 2022-02-25 – 2022-02-26 (×6): 1 mg via INTRAVENOUS
  Filled 2022-02-25 (×7): qty 1

## 2022-02-25 MED ORDER — DIPHENHYDRAMINE HCL 50 MG/ML IJ SOLN: 12.5000 mg | Freq: Four times a day (QID) | INTRAMUSCULAR | Status: DC | PRN

## 2022-02-25 MED ORDER — ONDANSETRON HCL 4 MG PO TABS: 4.0000 mg | ORAL_TABLET | Freq: Three times a day (TID) | ORAL | 0 refills | Status: AC | PRN

## 2022-02-25 MED ORDER — ONDANSETRON HCL 4 MG/2ML IJ SOLN: 4.0000 mg | Freq: Four times a day (QID) | INTRAMUSCULAR | Status: DC | PRN

## 2022-02-25 NOTE — Progress Notes (Signed)
Patient had clear liquid diet for lunch. Ate 75%. Tolerating well.  Lidia Collum, RN

## 2022-02-25 NOTE — TOC CM/SW Note (Signed)
  Transition of Care [facility]) Screening Note   Patient Details  Name: Jose Shaffer Date of Birth: [birth year]       Transition of Care Department [facility]) has reviewed patient and no TOC needs have been identified at this time. We will continue to monitor patient advancement through interdisciplinary progression rounds. If new patient transition needs arise, please place a TOC consult.

## 2022-02-25 NOTE — Progress Notes (Signed)
1 Day Post-Op   Subjective/Chief Complaint: Reports only mild pain Hiccups resolved No flatus yet   Objective: Vital signs in last 24 hours: Temp:  [98.1 F (36.7 C)-98.9 F (37.2 C)] 98.5 F (36.9 C) (12/15 0501) Pulse Rate:  [90-105] 90 (12/15 0501) Resp:  [12-21] 18 (12/15 0505) BP: (128-166)/(76-107) 128/83 (12/15 0501) SpO2:  [92 %-100 %] 100 % (12/15 0505) Weight:  [111.1 kg] 111.1 kg (12/14 1858) Last BM Date : 02/22/22  Intake/Output from previous day: 12/14 0701 - 12/15 0700 In: 3101.3 [I.V.:1000; IV Piggyback:2101.3] Out: 720 [Urine:500; Emesis/NG output:200; Blood:20] Intake/Output this shift: No intake/output data recorded.  Exam: Awake and alert Looks comfortable Binder in place Abdomen soft  Lab Results:  Recent Labs    02/24/22 0616 02/25/22 0317  WBC 19.2* 15.5*  HGB 19.8* 17.1*  HCT 59.7* 52.4*  PLT 357 285   BMET Recent Labs    02/24/22 0616 02/25/22 0317  NA 137 137  K 4.0 4.6  CL 98 102  CO2 28 24  GLUCOSE 141* 127*  BUN 18 15  CREATININE 1.30* 1.19  CALCIUM 9.8 8.7*   PT/INR No results for input(s): "LABPROT", "INR" in the last 72 hours. ABG No results for input(s): "PHART", "HCO3" in the last 72 hours.  Invalid input(s): "PCO2", "PO2"  Studies/Results: CT Abdomen Pelvis W Contrast  Result Date: 02/24/2022 CLINICAL DATA:  Bowel obstruction suspected. Nausea and vomiting. Recent umbilical hernia repair. EXAM: CT ABDOMEN AND PELVIS WITH CONTRAST TECHNIQUE: Multidetector CT imaging of the abdomen and pelvis was performed using the standard protocol following bolus administration of intravenous contrast. RADIATION DOSE REDUCTION: This exam was performed according to the departmental dose-optimization program which includes automated exposure control, adjustment of the mA and/or kV according to patient size and/or use of iterative reconstruction technique. CONTRAST:  15mL OMNIPAQUE IOHEXOL 350 MG/ML SOLN COMPARISON:  CT abdomen and  pelvis 10/01/2021 FINDINGS: Lower chest: Dependent atelectasis in the lower lobes. No pleural effusion. Hepatobiliary: Hepatic steatosis. Unremarkable gallbladder. No biliary dilatation. Pancreas: Unremarkable. Spleen: Unremarkable. Adrenals/Urinary Tract: Unremarkable adrenal glands. No suspicious renal mass, calculi, or hydronephrosis. Unremarkable bladder. Stomach/Bowel: An enteric tube terminates in the proximal duodenum. There is left-sided colonic diverticulosis without evidence of acute diverticulitis. Sequelae of interval umbilical hernia repair are identified with mild regional subcutaneous fat stranding and gas. There is no fluid collection. A recurrent umbilical hernia is present containing a loop of small bowel. The small bowel proximal to this is dilated up to 4.5 cm in diameter with numerous air-fluid levels. The more distal small bowel is decompressed. A small bowel anastomosis is noted in the right upper quadrant. The appendix is unremarkable. Vascular/Lymphatic: Normal caliber of the abdominal aorta. No enlarged lymph nodes. Reproductive: Mildly enlarged prostate. Other: No significant ascites or pneumoperitoneum. Unchanged fat in both inguinal canals. Musculoskeletal: No acute osseous abnormality or suspicious osseous lesion. Unchanged prominent Schmorl's node involving the L4 superior endplate. IMPRESSION: 1. 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 Hepatic steatosis. 2. Colonic diverticulosis. Electronically Signed   By: Sebastian Ache M.D.   On: 02/24/2022 13:25   DG Abdomen 1 View  Result Date: 02/24/2022 CLINICAL DATA:  Nasogastric tube placement. EXAM: ABDOMEN - 1 VIEW COMPARISON:  Same day. FINDINGS: Distal tip of nasogastric tube is seen in expected position of distal stomach. IMPRESSION: Distal tip of nasogastric tube is seen in expected position of distal stomach. Electronically Signed   By: Zenda Alpers.D.  On: 02/24/2022  10:58   DG Abdomen 1 View  Result Date: 02/24/2022 CLINICAL DATA:  Evaluate for small bowel obstruction. EXAM: ABDOMEN - 1 VIEW COMPARISON:  05/15/21 abdominal radiograph, CT AP 10/01/21 FINDINGS: There are multiple dilated loops of small bowel throughout the abdomen measuring up to 5 cm. No significant colonic gas is visualized. No pneumatosis. No pneumoperitoneum. No acute osseous abnormality. Pelvic phleboliths are visualized. IMPRESSION: Multiple dilated loops of small bowel throughout the abdomen measuring up to 5 cm, concerning for small bowel obstruction. Electronically Signed   By: Lorenza Cambridge M.D.   On: 02/24/2022 08:52    Anti-infectives: Anti-infectives (From admission, onward)    Start     Dose/Rate Route Frequency Ordered Stop   02/24/22 1458  ceFAZolin (ANCEF) 2-4 GM/100ML-% IVPB       Note to Pharmacy: Shanda Bumps M: cabinet override      02/24/22 1458 02/25/22 0259       Assessment/Plan: s/p Procedure(s): EXPLORATORY LAPAROTOMY (N/A) REPAIR OF RECURRENT INCISIONAL HERNIA (N/A) INSERTION OF MESH (N/A)  Continue NG IV rehydration D/c PCA   LOS: 1 day    Jose Shaffer 02/25/2022

## 2022-02-26 LAB — CBC
HCT: 51.2 % (ref 39.0–52.0)
Hemoglobin: 16.6 g/dL (ref 13.0–17.0)
MCH: 29.1 pg (ref 26.0–34.0)
MCHC: 32.4 g/dL (ref 30.0–36.0)
MCV: 89.8 fL (ref 80.0–100.0)
Platelets: 272 10*3/uL (ref 150–400)
RBC: 5.7 MIL/uL (ref 4.22–5.81)
RDW: 13.8 % (ref 11.5–15.5)
WBC: 12.8 10*3/uL — ABNORMAL HIGH (ref 4.0–10.5)
nRBC: 0 % (ref 0.0–0.2)

## 2022-02-26 LAB — BASIC METABOLIC PANEL
Anion gap: 8 (ref 5–15)
BUN: 11 mg/dL (ref 6–20)
CO2: 24 mmol/L (ref 22–32)
Calcium: 8.1 mg/dL — ABNORMAL LOW (ref 8.9–10.3)
Chloride: 103 mmol/L (ref 98–111)
Creatinine, Ser: 0.99 mg/dL (ref 0.61–1.24)
GFR, Estimated: >60 mL/min (ref >60)
Glucose, Bld: 105 mg/dL — ABNORMAL HIGH (ref 70–99)
Potassium: 4.2 mmol/L (ref 3.5–5.1)
Sodium: 135 mmol/L (ref 135–145)

## 2022-02-26 MED ORDER — HYDROMORPHONE HCL 1 MG/ML IJ SOLN
1.0000 mg | INTRAMUSCULAR | Status: DC | PRN
  Administered 2022-02-26: 1 mg via INTRAVENOUS
  Filled 2022-02-26: qty 1

## 2022-02-26 MED ORDER — TRAMADOL HCL 50 MG PO TABS
50.0000 mg | ORAL_TABLET | Freq: Four times a day (QID) | ORAL | Status: DC
  Administered 2022-02-26 – 2022-02-27 (×3): 50 mg via ORAL
  Filled 2022-02-26 (×3): qty 1

## 2022-02-26 MED ORDER — HYDROMORPHONE HCL 1 MG/ML IJ SOLN
1.0000 mg | INTRAMUSCULAR | Status: DC | PRN
  Administered 2022-02-26 (×2): 1 mg via INTRAVENOUS
  Filled 2022-02-26 (×2): qty 1

## 2022-02-26 MED ORDER — SENNOSIDES-DOCUSATE SODIUM 8.6-50 MG PO TABS
1.0000 | ORAL_TABLET | Freq: Two times a day (BID) | ORAL | Status: DC
  Administered 2022-02-26 (×2): 1 via ORAL
  Filled 2022-02-26 (×2): qty 1

## 2022-02-26 MED ORDER — OXYCODONE HCL 5 MG PO TABS
5.0000 mg | ORAL_TABLET | Freq: Four times a day (QID) | ORAL | Status: DC | PRN
  Administered 2022-02-26: 5 mg via ORAL
  Filled 2022-02-26: qty 1

## 2022-02-26 NOTE — Progress Notes (Signed)
NG tube removed as per order, patient tolerated well.

## 2022-02-26 NOTE — Progress Notes (Signed)
2 Days Post-Op   Subjective/Chief Complaint: Pain controlled Having flatus, tolerated NG clamped overnight   Objective: Vital signs in last 24 hours: Temp:  [97.6 F (36.4 C)-98.7 F (37.1 C)] 98.6 F (37 C) (12/16 0747) Pulse Rate:  [83-95] 83 (12/16 0747) Resp:  [16-19] 16 (12/16 0747) BP: (144-164)/(88-97) 148/97 (12/16 0747) SpO2:  [95 %-97 %] 97 % (12/16 0747) Last BM Date : 02/22/22  Intake/Output from previous day: 12/15 0701 - 12/16 0700 In: 3289 [P.O.:480; I.V.:2809] Out: 600 [Urine:500; Emesis/NG output:100] Intake/Output this shift: No intake/output data recorded.  Exam: Awake and alert Looks comfortable Binder in place Abdomen soft  Lab Results:  Recent Labs    02/25/22 0317 02/26/22 0207  WBC 15.5* 12.8*  HGB 17.1* 16.6  HCT 52.4* 51.2  PLT 285 272    BMET Recent Labs    02/25/22 0317 02/26/22 0207  NA 137 135  K 4.6 4.2  CL 102 103  CO2 24 24  GLUCOSE 127* 105*  BUN 15 11  CREATININE 1.19 0.99  CALCIUM 8.7* 8.1*    PT/INR No results for input(s): "LABPROT", "INR" in the last 72 hours. ABG No results for input(s): "PHART", "HCO3" in the last 72 hours.  Invalid input(s): "PCO2", "PO2"  Studies/Results: CT Abdomen Pelvis W Contrast  Result Date: 02/24/2022 CLINICAL DATA:  Bowel obstruction suspected. Nausea and vomiting. Recent umbilical hernia repair. EXAM: CT ABDOMEN AND PELVIS WITH CONTRAST TECHNIQUE: Multidetector CT imaging of the abdomen and pelvis was performed using the standard protocol following bolus administration of intravenous contrast. RADIATION DOSE REDUCTION: This exam was performed according to the departmental dose-optimization program which includes automated exposure control, adjustment of the mA and/or kV according to patient size and/or use of iterative reconstruction technique. CONTRAST:  64mL OMNIPAQUE IOHEXOL 350 MG/ML SOLN COMPARISON:  CT abdomen and pelvis 10/01/2021 FINDINGS: Lower chest: Dependent  atelectasis in the lower lobes. No pleural effusion. Hepatobiliary: Hepatic steatosis. Unremarkable gallbladder. No biliary dilatation. Pancreas: Unremarkable. Spleen: Unremarkable. Adrenals/Urinary Tract: Unremarkable adrenal glands. No suspicious renal mass, calculi, or hydronephrosis. Unremarkable bladder. Stomach/Bowel: An enteric tube terminates in the proximal duodenum. There is left-sided colonic diverticulosis without evidence of acute diverticulitis. Sequelae of interval umbilical hernia repair are identified with mild regional subcutaneous fat stranding and gas. There is no fluid collection. A recurrent umbilical hernia is present containing a loop of small bowel. The small bowel proximal to this is dilated up to 4.5 cm in diameter with numerous air-fluid levels. The more distal small bowel is decompressed. A small bowel anastomosis is noted in the right upper quadrant. The appendix is unremarkable. Vascular/Lymphatic: Normal caliber of the abdominal aorta. No enlarged lymph nodes. Reproductive: Mildly enlarged prostate. Other: No significant ascites or pneumoperitoneum. Unchanged fat in both inguinal canals. Musculoskeletal: No acute osseous abnormality or suspicious osseous lesion. Unchanged prominent Schmorl's node involving the L4 superior endplate. IMPRESSION: 1. 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 Hepatic steatosis. 2. Colonic diverticulosis. Electronically Signed   By: Sebastian Ache M.D.   On: 02/24/2022 13:25   DG Abdomen 1 View  Result Date: 02/24/2022 CLINICAL DATA:  Nasogastric tube placement. EXAM: ABDOMEN - 1 VIEW COMPARISON:  Same day. FINDINGS: Distal tip of nasogastric tube is seen in expected position of distal stomach. IMPRESSION: Distal tip of nasogastric tube is seen in expected position of distal stomach. Electronically Signed   By: Lupita Raider M.D.   On: 02/24/2022 10:58   DG Abdomen 1 View  Result Date:  02/24/2022 CLINICAL DATA:  Evaluate for small bowel obstruction. EXAM: ABDOMEN - 1 VIEW COMPARISON:  05/15/21 abdominal radiograph, CT AP 10/01/21 FINDINGS: There are multiple dilated loops of small bowel throughout the abdomen measuring up to 5 cm. No significant colonic gas is visualized. No pneumatosis. No pneumoperitoneum. No acute osseous abnormality. Pelvic phleboliths are visualized. IMPRESSION: Multiple dilated loops of small bowel throughout the abdomen measuring up to 5 cm, concerning for small bowel obstruction. Electronically Signed   By: Lorenza Cambridge M.D.   On: 02/24/2022 08:52    Anti-infectives: Anti-infectives (From admission, onward)    Start     Dose/Rate Route Frequency Ordered Stop   02/24/22 1458  ceFAZolin (ANCEF) 2-4 GM/100ML-% IVPB       Note to Pharmacy: Shanda Bumps M: cabinet override      02/24/22 1458 02/25/22 0259       Assessment/Plan: s/p Procedure(s): EXPLORATORY LAPAROTOMY (N/A) REPAIR OF RECURRENT INCISIONAL HERNIA (N/A) INSERTION OF MESH (N/A)  D/c NG Fulls SL IVF PO pain meds, stool softeners Ambulate in hall   LOS: 2 days    Vanita Panda 02/26/2022

## 2022-02-27 MED ORDER — TRAMADOL HCL 50 MG PO TABS: 50.0000 mg | ORAL_TABLET | Freq: Four times a day (QID) | ORAL | 0 refills | Status: AC

## 2022-02-27 NOTE — Discharge Instructions (Signed)
HERNIA REPAIR: POST OP INSTRUCTIONS  DIET: Follow a light bland diet the first 24 hours after arrival home, such as soup, liquids, crackers, etc.  Be sure to include lots of fluids daily.  Avoid fast food or heavy meals as your are more likely to get nauseated.  Eat a low fat the next few days after surgery. Take your usually prescribed home medications unless otherwise directed. PAIN CONTROL: Pain is best controlled by a usual combination of three different methods TOGETHER: Ice/Heat Over the counter pain medication Prescription pain medication Most patients will experience some swelling and bruising around the hernia(s) such as the bellybutton, groins, or old incisions.  Ice packs or heating pads (30-60 minutes up to 6 times a day) will help. Use ice for the first few days to help decrease swelling and bruising, then switch to heat to help relax tight/sore spots and speed recovery.  Some people prefer to use ice alone, heat alone, alternating between ice & heat.  Experiment to what works for you.  Swelling and bruising can take several weeks to resolve.   It is helpful to take an over-the-counter pain medication regularly for the first few weeks.  Choose one of the following that works best for you: Naproxen (Aleve, etc)  Two 220mg  tabs twice a day Ibuprofen (Advil, etc) Three 200mg  tabs four times a day (every meal & bedtime) A  prescription for pain medication should be given to you upon discharge.  Take your pain medication as prescribed.  If you are having problems/concerns with the prescription medicine (does not control pain, nausea, vomiting, rash, itching, etc), please call [phone] to see if we need to switch you to a different pain medicine that will work better for you and/or control your side effect better. If you need a refill on your pain medication, please contact your pharmacy.  They will contact our office to request authorization. Prescriptions will not be filled after 5  pm or on week-ends. Avoid getting constipated.  Between the surgery and the pain medications, it is common to experience some constipation.  Increasing fluid intake and taking a fiber supplement (such as Metamucil, Citrucel, FiberCon, MiraLax, etc) 1-2 times a day regularly will usually help prevent this problem from occurring.  A mild laxative (prune juice, Milk of Magnesia, MiraLax, etc) should be taken according to package directions if there are no bowel movements after 48 hours.   Wash / shower every day.  You may shower over the dressings as they are waterproof.   Remove your waterproof bandages 5 days after surgery.  You may leave the incision open to air.  You may replace a dressing/Band-Aid to cover the incision for comfort if you wish.  Continue to shower over incision(s) after the dressing is off.    ACTIVITIES as tolerated:   You may resume regular (light) daily activities beginning the next day--such as daily self-care, walking, climbing stairs--gradually increasing activities as tolerated.  If you can walk 30 minutes without difficulty, it is safe to try more intense activity such as jogging, treadmill, bicycling, low-impact aerobics, swimming, etc. Save the most intensive and strenuous activity for last such as sit-ups, heavy lifting, contact sports, etc  Refrain from any heavy lifting or straining until you are off narcotics for pain control.   DO NOT PUSH THROUGH PAIN.  Let pain be your guide: If it hurts to do something, don't do it.  Pain is your body warning you to avoid that activity for another  week until the pain goes down. You may drive when you are no longer taking prescription pain medication, you can comfortably wear a seatbelt, and you can safely maneuver your car and apply brakes. You may have sexual intercourse when it is comfortable.  FOLLOW UP in our office Please call [facility] at [phone] to set up an appointment to see your surgeon in the office for a follow-up  appointment approximately 2-3 weeks after your surgery. Make sure that you call for this appointment the day you arrive home to insure a convenient appointment time. 9.  IF YOU HAVE DISABILITY OR FAMILY LEAVE FORMS, BRING THEM TO THE OFFICE FOR PROCESSING.  DO NOT GIVE THEM TO YOUR DOCTOR.  WHEN TO CALL us [phone]: Poor pain control Reactions / problems with new medications (rash/itching, nausea, etc)  Fever over 101.5 F (38.5 C) Inability to urinate Nausea and/or vomiting Worsening swelling or bruising Continued bleeding from incision. Increased pain, redness, or drainage from the incision   The clinic staff is available to answer your questions during regular business hours (8:30am-5pm).  Please don't hesitate to call and ask to speak to one of our nurses for clinical concerns.   If you have a medical emergency, go to the nearest emergency room or call 911.  A surgeon from [facility] is always on call at the hospitals in [facility], [place] [address], [place], [place]  [address] ?  [address] [address], [place], [place]   [address] MAIN: [phone] ? TOLL FREE: [phone] ? FAX: [phone] [email or web address]

## 2022-02-27 NOTE — Progress Notes (Signed)
Discharge instructions given. Patient verbalized understanding and all questions were answered.  ?

## 2022-02-27 NOTE — Discharge Summary (Signed)
Physician Discharge Summary  Patient ID: Jose Shaffer MRN: [id number] DOB/AGE: 06-29-1970 51 y.o.  Admit date: 02/24/2022 Discharge date: 02/27/2022  Admission Diagnoses: Recurrent Umbilical hernia  Discharge Diagnoses:  Principal Problem:   Recurrent umbilical hernia   Discharged Condition: good  Hospital Course: Patient was admitted to the med surg floor after surgery.  Diet was advanced as tolerated.  Patient began to have bowel function on postop day 2.  By postop day 3, he was tolerating a solid diet and pain was controlled with oral medications.  He was urinating without difficulty and ambulating without assistance.  Patient was felt to be in stable condition for discharge to home.   Consults: None  Significant Diagnostic Studies: labs: cbc, bmet  Treatments: IV hydration, analgesia: acetaminophen, Dilaudid, and Tramadol, and surgery: hernia repair  Discharge Exam: Blood pressure 137/89, pulse 78, temperature 98.3 F (36.8 C), temperature source Oral, resp. rate 18, height 5\' 10"  (1.778 m), weight 111.1 kg, SpO2 96 %. General appearance: alert and cooperative GI: soft, non-distended, appropriately tender Incision/Wound: clean, dry, intact  Disposition: Discharge disposition: 01-Home or Self Care        Allergies as of 02/27/2022       Reactions   Ibuprofen Itching, Rash   Per patient feels like KNOT in throat and itching with rash   Tramadol Anxiety   Tylenol [acetaminophen] Rash   Knot on throat        Medication List     STOP taking these medications    oxyCODONE 5 MG immediate release tablet Commonly known as: Oxy IR/ROXICODONE       TAKE these medications    BENEFIBER PO Take 5 mLs by mouth 2 (two) times daily. Mix in liquid and drink   fluticasone 50 MCG/ACT nasal spray Commonly known as: FLONASE Place 1 spray into both nostrils every evening.   methylphenidate 50 MG CR capsule Commonly known as: METADATE CD Take 50 mg by  mouth every morning.   montelukast 10 MG tablet Commonly known as: SINGULAIR TAKE 1 TABLET(10 MG) BY MOUTH AT BEDTIME   NON FORMULARY Pt uses a cpap nightly   ondansetron 4 MG tablet Commonly known as: Zofran Take 1 tablet (4 mg total) by mouth every 8 (eight) hours as needed for nausea or vomiting.   traMADol 50 MG tablet Commonly known as: ULTRAM Take 1 tablet (50 mg total) by mouth every 6 (six) hours.        Follow-up Information     03/01/2022, MD Follow up on 03/08/2022.   Specialty: General Surgery Why: 03/10/2022 removal.  once discharged, call the office to see me on 12/26 to remove your staples Contact information: [address] [place] [phone]                 Signed: [phone] 02/27/2022, 7:45 AM

## 2022-03-08 ENCOUNTER — Encounter (HOSPITAL_COMMUNITY): Payer: Self-pay | Admitting: Surgery

## 2022-03-12 IMAGING — DX DG ABDOMEN 1V
2 series · 2 of 2 positions shown · non-contrast
Comparison: 08/20/2020

CLINICAL DATA: Evaluate nasogastric tube placement.

EXAM:
ABDOMEN - 1 VIEW

[abdomen kub (1 of 2)]
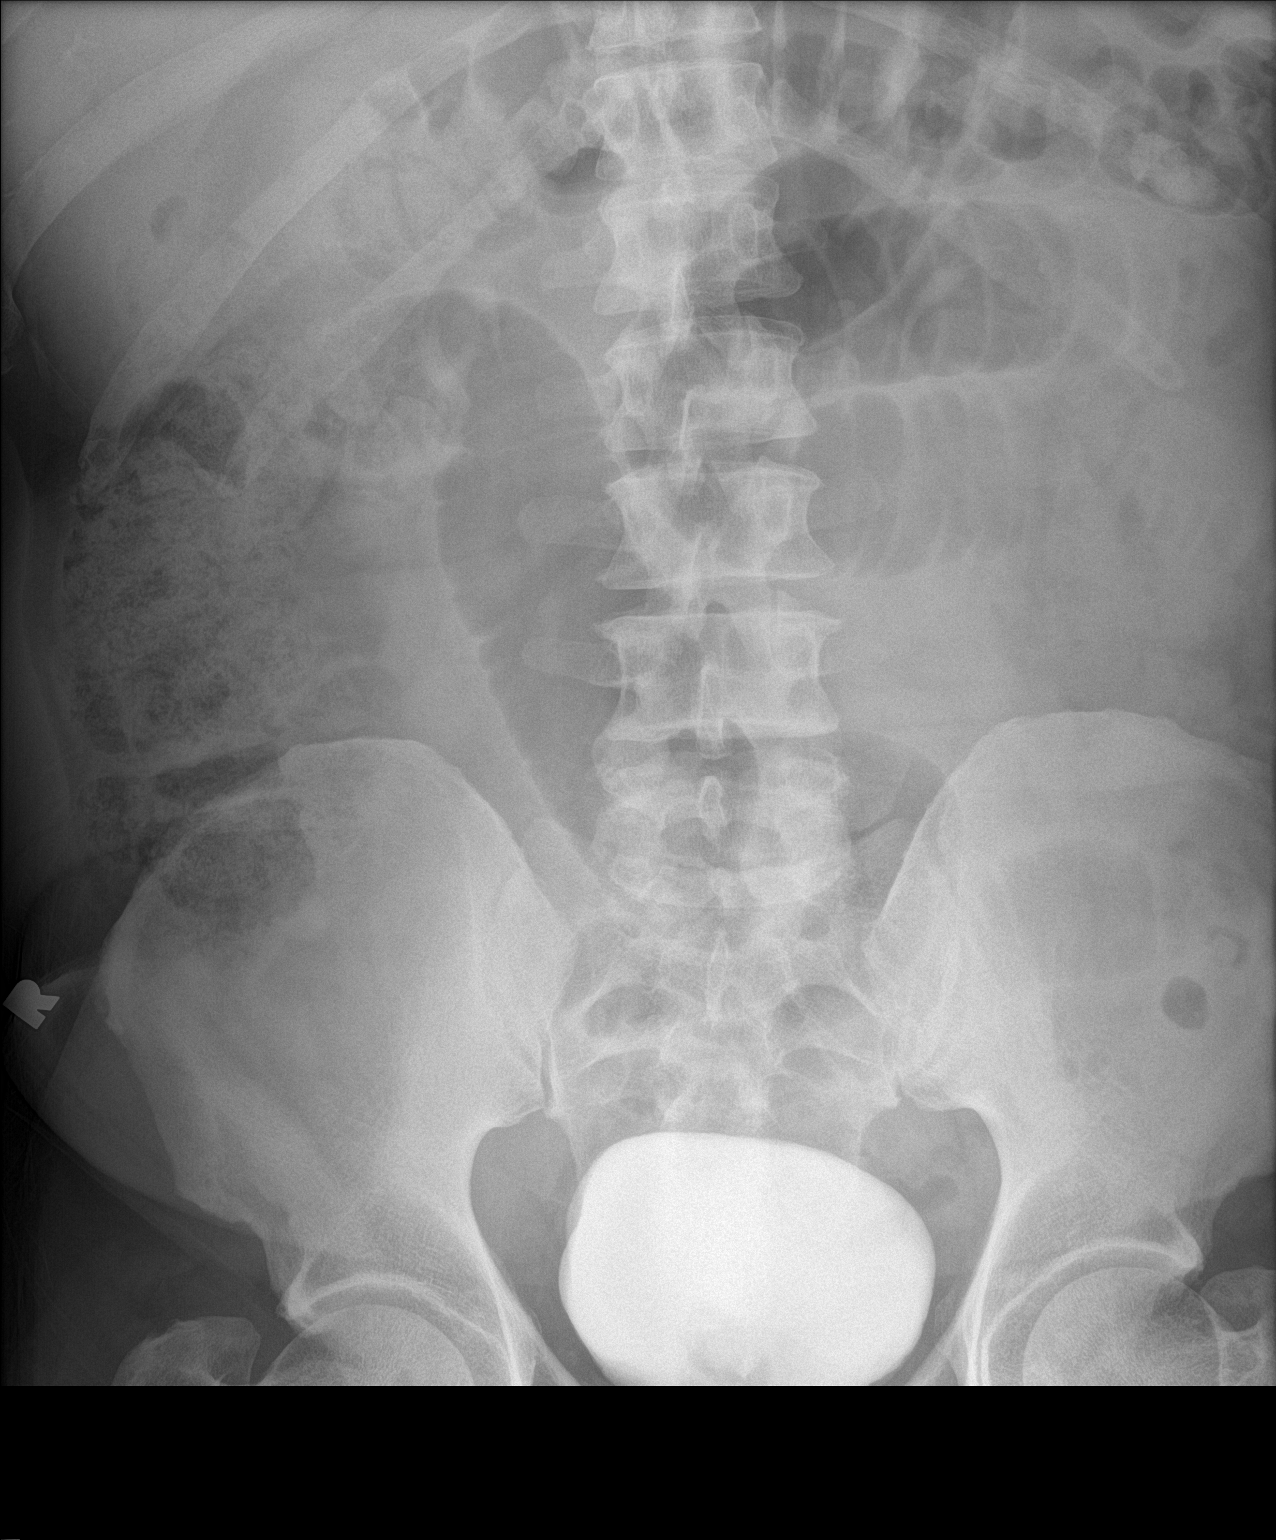

[abdomen kub (2 of 2)]
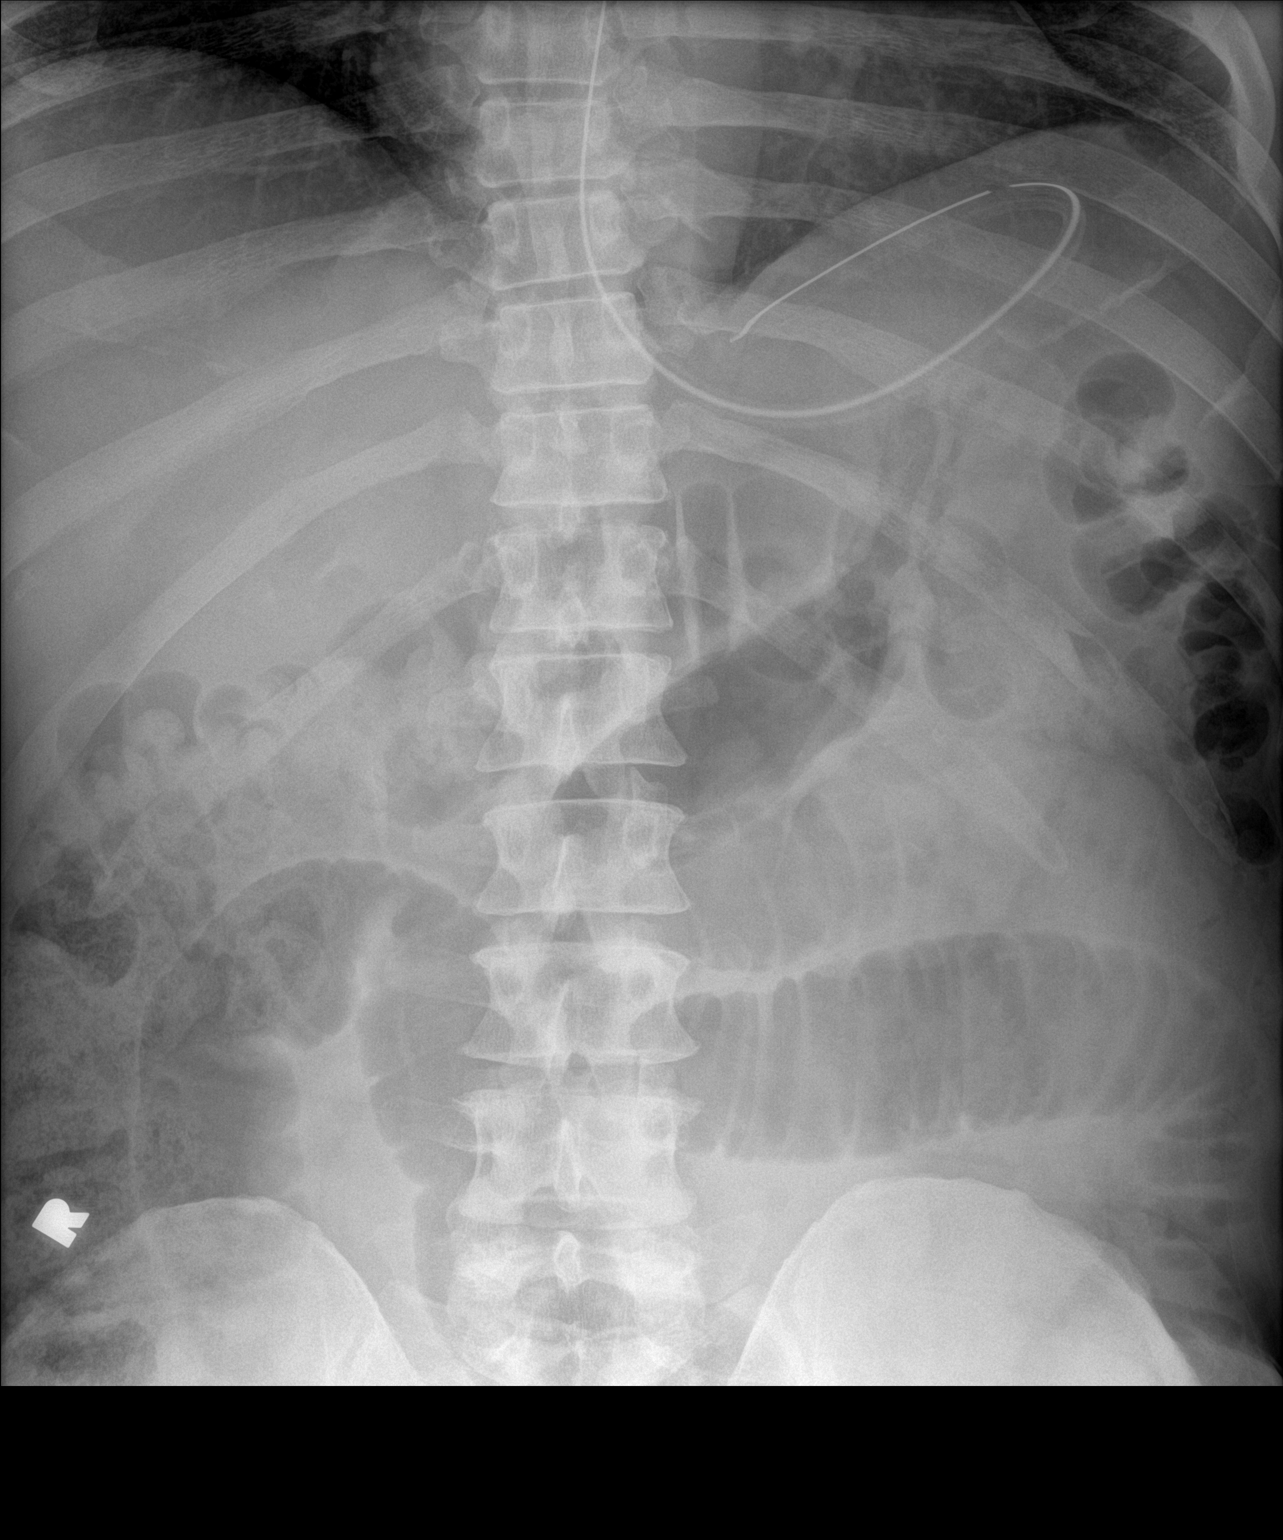

[2 of 2 positions shown; findings below may reference images not displayed]

FINDINGS: The nasogastric tube tip and side port are both below the level of
the GE junction. The enteric tube is coiled within the fundus of the
stomach. Dilated loops of small bowel are again noted compatible
with previously demonstrated small bowel obstruction. Contrast
material opacifies the bladder.
IMPRESSION: Nasogastric tube tip and side port are both below the level of the
GE junction.

## 2022-03-12 IMAGING — CT CT ABD-PELV W/ CM
2 of 5 series · 15 of 46 positions shown, 17 images · IV contrast (omnipaque)
Comparison: 09/09/2018

CLINICAL DATA: Abdominal pain.  History of hernia repair

EXAM:
CT ABDOMEN AND PELVIS WITH CONTRAST
TECHNIQUE: Multidetector CT imaging of the abdomen and pelvis was performed
using the standard protocol following bolus administration of
intravenous contrast.
CONTRAST:  75mL OMNIPAQUE IOHEXOL 300 MG/ML  SOLN

[Series 2: axial st · axial · 0.98mm/px · z∈[-605,-105]mm · 12 of 112 slices shown, 14 images]
[im 6/112  soft-tissue]
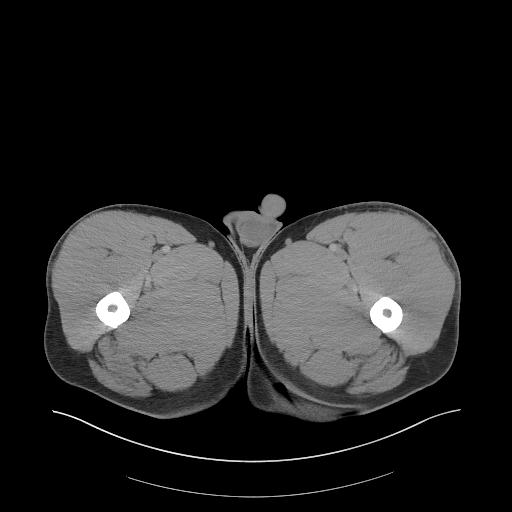
[im 6/112  bone]
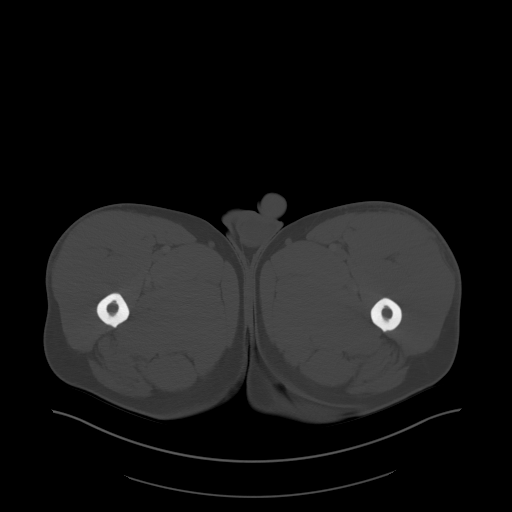
[im 17/112  soft-tissue]
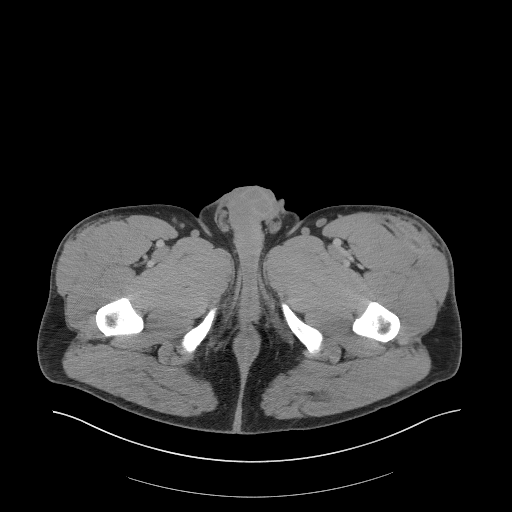
[im 23/112  soft-tissue]
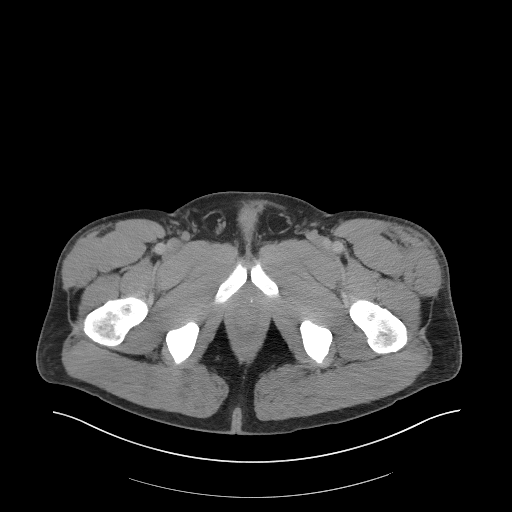
[im 34/112  soft-tissue]
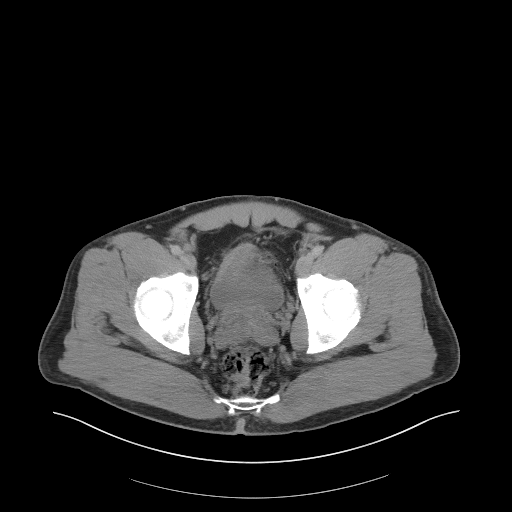
[im 45/112  soft-tissue]
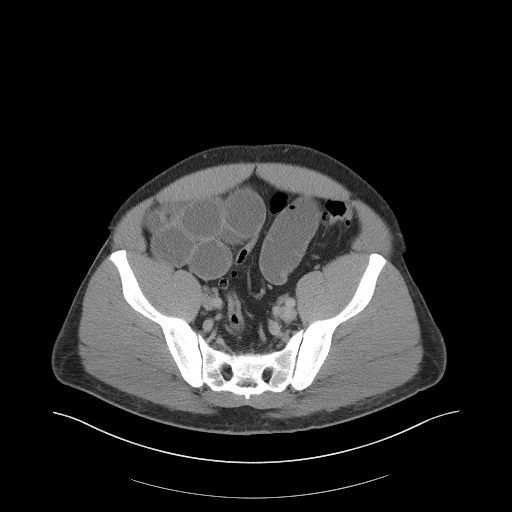
[im 50/112  soft-tissue]
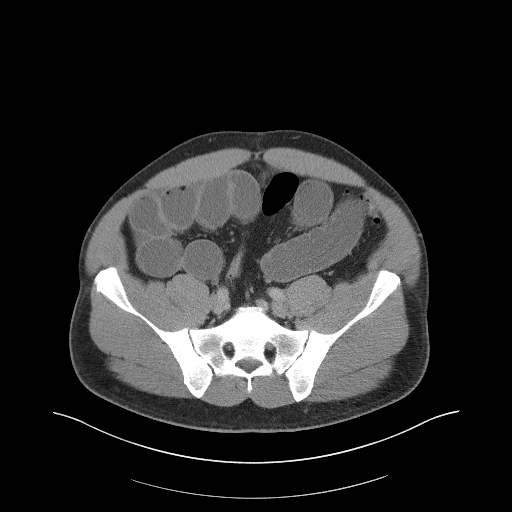
[im 62/112  soft-tissue]
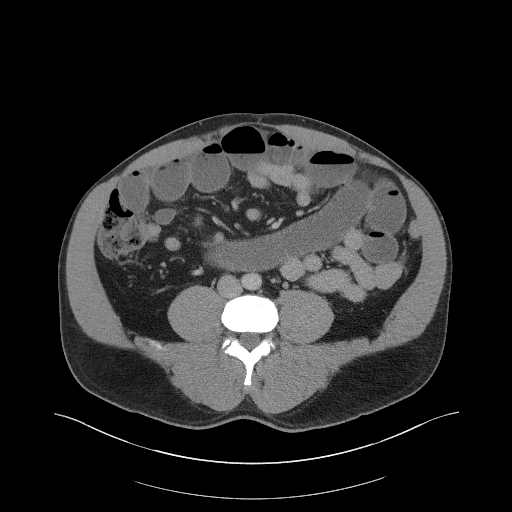
[im 67/112  soft-tissue]
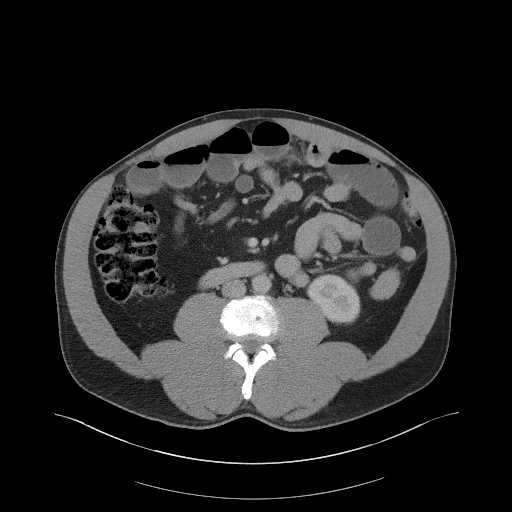
[im 78/112  soft-tissue]
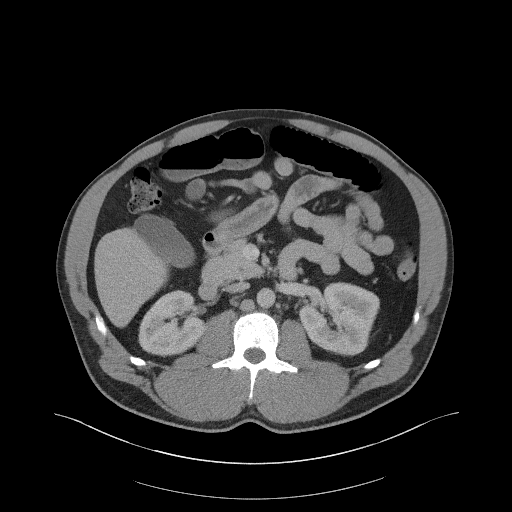
[im 78/112  bone]
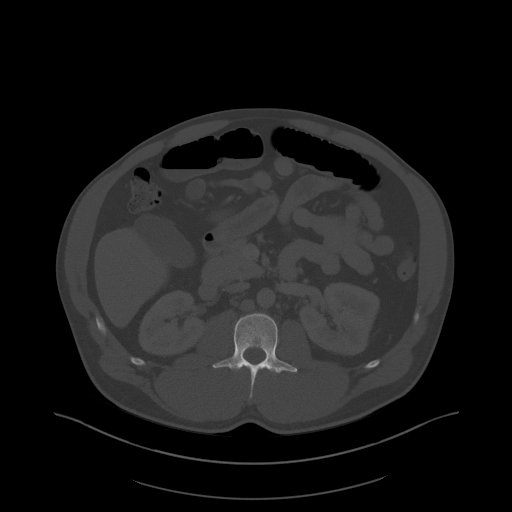
[im 89/112  soft-tissue]
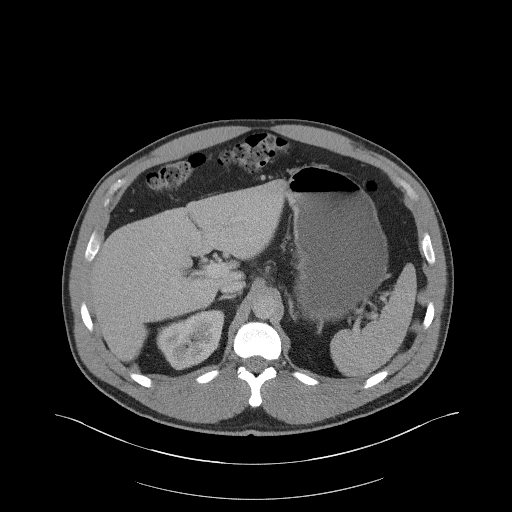
[im 95/112  soft-tissue]
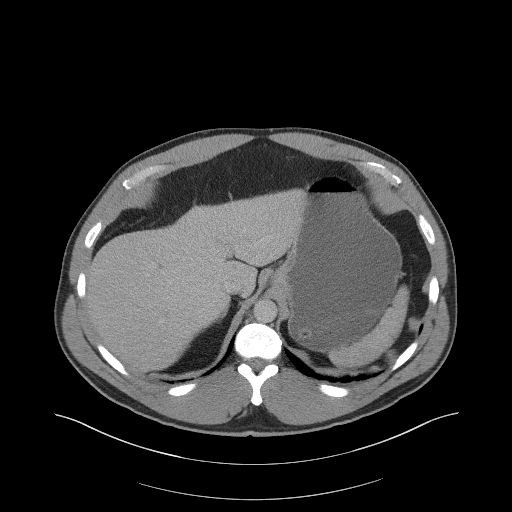
[im 106/112  soft-tissue]
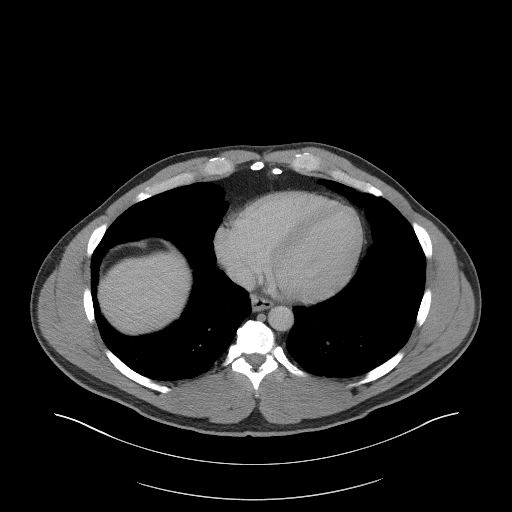

[Series 5: coronal st · coronal · 0.89mm/px · 3 of 106 slices shown]
[im 36/106  soft-tissue]
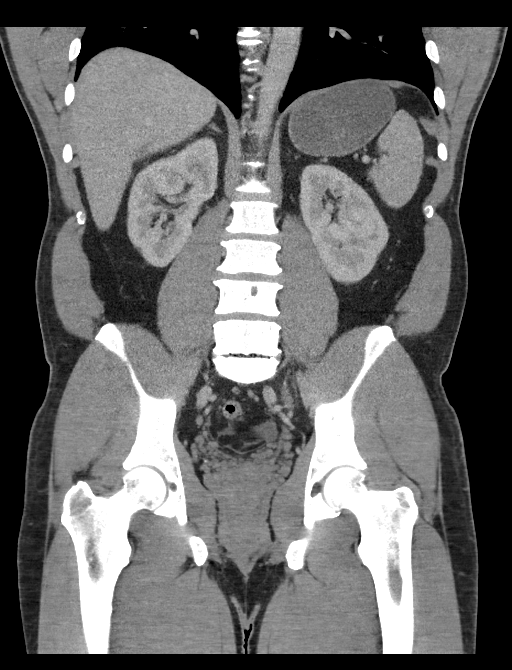
[im 47/106  soft-tissue]
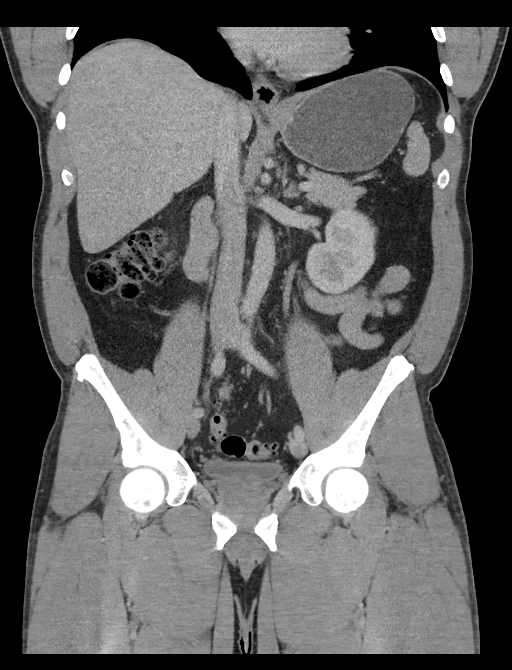
[im 59/106  soft-tissue]
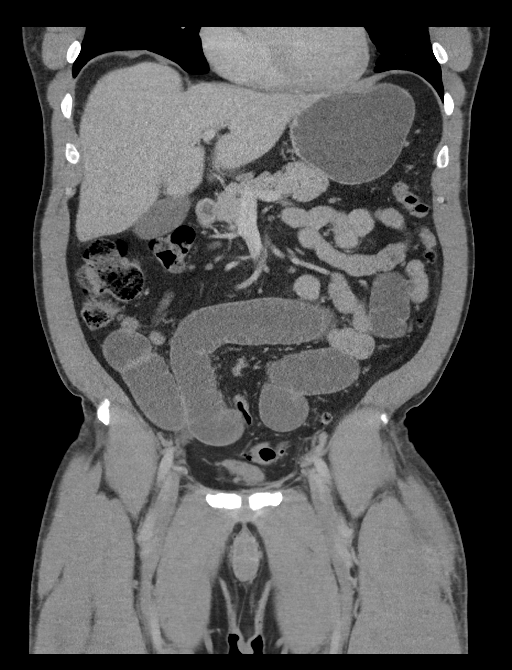

[15 of 46 positions shown; findings below may reference images not displayed]

FINDINGS: Lower chest: Included lung bases are clear. Heart size within normal
limits.

Hepatobiliary: No focal liver abnormality is seen. No gallstones,
gallbladder wall thickening, or biliary dilatation.

Pancreas: Unremarkable. No pancreatic ductal dilatation or
surrounding inflammatory changes.

Spleen: Normal in size without focal abnormality.

Adrenals/Urinary Tract: Unremarkable adrenal glands. Kidneys enhance
symmetrically without focal lesion, stone, or hydronephrosis.
Ureters are nondilated. Mild circumferential urinary bladder wall
thickening.

Stomach/Bowel: Stomach is fluid distended. Numerous dilated loops of
small bowel within the mid to lower abdomen measuring up to 4.0 cm
in diameter. Transition to collapsed bowel anteriorly adjacent to
the umbilicus (series 2, image 59). No pneumatosis. Distal small
bowel and colon are decompressed. Extensive sigmoid diverticulosis.
Normal appendix in the right lower quadrant.

Vascular/Lymphatic: No significant vascular findings are present. No
enlarged abdominal or pelvic lymph nodes.

Reproductive: Prostate gland is mildly enlarged.

Other: Trace free fluid within the pelvis, likely reactive. No
organized abdominopelvic fluid collection or abscess. No
pneumoperitoneum.

Musculoskeletal: No acute or significant osseous findings.
Nonspecific stranding within the soft tissues of the anterior
proximal thighs bilaterally, unchanged from prior (series 2, image
96).
IMPRESSION: 1. Small-bowel obstruction with transition to collapsed bowel
anteriorly adjacent to the umbilicus, presumably secondary to
adhesions from prior hernia surgery. No pneumatosis or
pneumoperitoneum.
2. Extensive sigmoid diverticulosis without evidence of acute
diverticulitis.
3. Mild circumferential urinary bladder wall thickening. Correlate
with urinalysis to exclude cystitis.
4. Trace free fluid within the pelvis, likely reactive. No organized
abdominopelvic fluid collection or abscess.
